# Patient Record
Sex: Female | Born: 1945 | Race: White | Hispanic: No | Smoking: Current every day smoker
Health system: Southern US, Community
[De-identification: ages and names within clinical notes are randomized; demographics above are authoritative.]

## PROBLEM LIST (undated history)

## (undated) DIAGNOSIS — F419 Anxiety disorder, unspecified: Secondary | ICD-10-CM

## (undated) DIAGNOSIS — I10 Essential (primary) hypertension: Secondary | ICD-10-CM

## (undated) DIAGNOSIS — E871 Hypo-osmolality and hyponatremia: Secondary | ICD-10-CM

## (undated) DIAGNOSIS — R296 Repeated falls: Secondary | ICD-10-CM

## (undated) DIAGNOSIS — F32A Depression, unspecified: Secondary | ICD-10-CM

## (undated) DIAGNOSIS — F329 Major depressive disorder, single episode, unspecified: Secondary | ICD-10-CM

## (undated) HISTORY — PX: ABDOMINAL HYSTERECTOMY: SHX81

---

## 2001-12-19 ENCOUNTER — Ambulatory Visit (HOSPITAL_COMMUNITY): Admission: RE | Admit: 2001-12-19 | Discharge: 2001-12-19 | Payer: Self-pay | Admitting: Internal Medicine

## 2001-12-19 ENCOUNTER — Encounter: Payer: Self-pay | Admitting: Internal Medicine

## 2002-03-04 ENCOUNTER — Ambulatory Visit (HOSPITAL_COMMUNITY): Admission: RE | Admit: 2002-03-04 | Discharge: 2002-03-04 | Payer: Self-pay | Admitting: Obstetrics and Gynecology

## 2002-03-04 ENCOUNTER — Encounter: Payer: Self-pay | Admitting: Obstetrics and Gynecology

## 2002-04-28 ENCOUNTER — Encounter: Payer: Self-pay | Admitting: Family Medicine

## 2002-04-28 ENCOUNTER — Ambulatory Visit (HOSPITAL_COMMUNITY): Admission: RE | Admit: 2002-04-28 | Discharge: 2002-04-28 | Payer: Self-pay | Admitting: Family Medicine

## 2002-08-06 ENCOUNTER — Encounter (HOSPITAL_COMMUNITY): Admission: RE | Admit: 2002-08-06 | Discharge: 2002-09-05 | Payer: Self-pay | Admitting: Internal Medicine

## 2002-09-07 ENCOUNTER — Encounter (HOSPITAL_COMMUNITY): Admission: RE | Admit: 2002-09-07 | Discharge: 2002-10-07 | Payer: Self-pay | Admitting: Internal Medicine

## 2003-04-06 ENCOUNTER — Observation Stay (HOSPITAL_COMMUNITY): Admission: AD | Admit: 2003-04-06 | Discharge: 2003-04-07 | Payer: Self-pay | Admitting: Internal Medicine

## 2003-05-14 ENCOUNTER — Ambulatory Visit (HOSPITAL_COMMUNITY): Admission: RE | Admit: 2003-05-14 | Discharge: 2003-05-14 | Payer: Self-pay | Admitting: Internal Medicine

## 2004-08-17 ENCOUNTER — Ambulatory Visit (HOSPITAL_COMMUNITY): Admission: RE | Admit: 2004-08-17 | Discharge: 2004-08-17 | Payer: Self-pay | Admitting: Internal Medicine

## 2005-03-19 ENCOUNTER — Ambulatory Visit: Payer: Self-pay | Admitting: Orthopedic Surgery

## 2005-03-29 ENCOUNTER — Ambulatory Visit: Payer: Self-pay | Admitting: Orthopedic Surgery

## 2005-04-04 ENCOUNTER — Encounter (HOSPITAL_COMMUNITY): Admission: RE | Admit: 2005-04-04 | Discharge: 2005-05-04 | Payer: Self-pay | Admitting: Orthopedic Surgery

## 2005-04-26 ENCOUNTER — Ambulatory Visit: Payer: Self-pay | Admitting: Orthopedic Surgery

## 2005-05-08 ENCOUNTER — Encounter: Admission: RE | Admit: 2005-05-08 | Discharge: 2005-05-08 | Payer: Self-pay | Admitting: Orthopedic Surgery

## 2005-05-22 ENCOUNTER — Encounter: Admission: RE | Admit: 2005-05-22 | Discharge: 2005-05-22 | Payer: Self-pay | Admitting: Orthopedic Surgery

## 2005-05-28 ENCOUNTER — Ambulatory Visit: Payer: Self-pay | Admitting: Orthopedic Surgery

## 2005-06-11 ENCOUNTER — Encounter: Admission: RE | Admit: 2005-06-11 | Discharge: 2005-06-11 | Payer: Self-pay | Admitting: Orthopedic Surgery

## 2005-07-13 ENCOUNTER — Encounter: Admission: RE | Admit: 2005-07-13 | Discharge: 2005-07-13 | Payer: Self-pay | Admitting: Orthopedic Surgery

## 2005-07-26 ENCOUNTER — Ambulatory Visit: Payer: Self-pay | Admitting: Orthopedic Surgery

## 2005-09-14 ENCOUNTER — Ambulatory Visit (HOSPITAL_COMMUNITY): Admission: RE | Admit: 2005-09-14 | Discharge: 2005-09-14 | Payer: Self-pay | Admitting: Internal Medicine

## 2005-12-07 ENCOUNTER — Ambulatory Visit (HOSPITAL_BASED_OUTPATIENT_CLINIC_OR_DEPARTMENT_OTHER): Admission: RE | Admit: 2005-12-07 | Discharge: 2005-12-07 | Payer: Self-pay | Admitting: Unknown Physician Specialty

## 2005-12-17 ENCOUNTER — Ambulatory Visit: Payer: Self-pay | Admitting: Internal Medicine

## 2006-01-04 ENCOUNTER — Ambulatory Visit (HOSPITAL_BASED_OUTPATIENT_CLINIC_OR_DEPARTMENT_OTHER): Admission: RE | Admit: 2006-01-04 | Discharge: 2006-01-04 | Payer: Self-pay | Admitting: Unknown Physician Specialty

## 2006-10-21 ENCOUNTER — Ambulatory Visit (HOSPITAL_COMMUNITY): Admission: RE | Admit: 2006-10-21 | Discharge: 2006-10-21 | Payer: Self-pay | Admitting: Internal Medicine

## 2007-01-22 ENCOUNTER — Ambulatory Visit: Payer: Self-pay | Admitting: Internal Medicine

## 2007-02-06 ENCOUNTER — Emergency Department (HOSPITAL_COMMUNITY): Admission: EM | Admit: 2007-02-06 | Discharge: 2007-02-06 | Payer: Self-pay | Admitting: Emergency Medicine

## 2007-02-21 ENCOUNTER — Ambulatory Visit (HOSPITAL_COMMUNITY): Admission: RE | Admit: 2007-02-21 | Discharge: 2007-02-21 | Payer: Self-pay | Admitting: Internal Medicine

## 2007-02-21 ENCOUNTER — Ambulatory Visit: Payer: Self-pay | Admitting: Internal Medicine

## 2007-11-03 ENCOUNTER — Ambulatory Visit (HOSPITAL_COMMUNITY): Admission: RE | Admit: 2007-11-03 | Discharge: 2007-11-03 | Payer: Self-pay | Admitting: Internal Medicine

## 2008-11-22 ENCOUNTER — Ambulatory Visit (HOSPITAL_COMMUNITY): Admission: RE | Admit: 2008-11-22 | Discharge: 2008-11-22 | Payer: Self-pay | Admitting: Internal Medicine

## 2009-11-15 ENCOUNTER — Emergency Department (HOSPITAL_COMMUNITY): Admission: EM | Admit: 2009-11-15 | Discharge: 2009-11-15 | Payer: Self-pay | Admitting: Emergency Medicine

## 2009-11-25 ENCOUNTER — Ambulatory Visit (HOSPITAL_COMMUNITY): Admission: RE | Admit: 2009-11-25 | Discharge: 2009-11-25 | Payer: Self-pay | Admitting: Internal Medicine

## 2011-01-14 ENCOUNTER — Encounter: Payer: Self-pay | Admitting: Internal Medicine

## 2011-01-15 ENCOUNTER — Ambulatory Visit (HOSPITAL_COMMUNITY)
Admission: RE | Admit: 2011-01-15 | Discharge: 2011-01-15 | Payer: Self-pay | Source: Home / Self Care | Attending: Internal Medicine | Admitting: Internal Medicine

## 2011-05-11 NOTE — Consult Note (Signed)
NAME:  Rachel Dodson, Rachel Dodson                          ACCOUNT NO.:  [id number]   MEDICAL RECORD NO.:  [id number]                   PATIENT TYPE:   LOCATION:                                       FACILITY:   PHYSICIAN:  Lionel December, M.D.                 DATE OF BIRTH:  11/19/1946   DATE OF CONSULTATION:  04/29/2003  DATE OF DISCHARGE:                                   CONSULTATION   REASON FOR CONSULTATION:  Iron-deficiency anemia.  Patient is here to  discuss further evaluation.   HISTORY OF PRESENT ILLNESS:  The patient is a 65 year old Caucasian female  who went to see Dr. Sherwood Gambler about three weeks ago because of weakness, shaky  legs, and hypotension.  She had also noted profound weakness.  She was found  to have a hemoglobin of 5.5 grams.  Iron studies subsequently confirmed it  to be iron deficiency.  She was given 2 units of PRBCs and felt a lot  better.  Her hemoglobin and hematocrit have come up; the last hematocrit  three weeks ago was 32%.  She has been on ferrous sulfate since then.  She  has also tested positive for Helicobacter pylori infection and finished  Prevpac about four days ago.  She denies melena or rectal bleeding.  Her  bowels move regularly.  She has a good appetite.  She did have hemoccult x2,  which were negative.  She has a five-year history of GERD, and she is  presently on Nexium, and she has virtually no heartburn or regurgitation.  She also denies dysphagia, hoarseness, or chronic cough.  She used to donate  blood fairly regularly but has not done so in the last one year.  She denies  hematuria or vaginal bleeding.  She has a good appetite and has not lost any  weight recently.   MEDICATIONS:  1. She is on Lotrel 5/20 mg daily.  2. Ferrous sulfate 325 mg t.i.d.  3. Nexium 40 mg q.a.m.  4. Xanax 0.5 mg t.i.d.  5. Remeron 15 mg daily.  6. Ibuprofen p.r.n.  7. __________ b.i.d.  8. B Complex daily.   PAST MEDICAL HISTORY:  1. Gastroesophageal reflux  disease for five years; she has been on Tums,     Zantac, Prevacid, Prilosec in the past.  She has never had EGD.  2. Hypertension was diagnosed two years ago.  3. She also has a history of depression and insomnia, symptoms well     controlled with therapy.  4. As above, she just finished H. pylori therapy (Prevpac).  5. She had a hysterectomy with BSO in 1987 for fibroids and endometriosis.   ALLERGIES:  None known.   FAMILY HISTORY:  Mother died at age 54; she had multiple health issues.  She  previously had been treated for breast carcinoma but was in remission.  She  also had dementia.  Father  died of pulmonary problem at age 62.  She does  not have any siblings.   SOCIAL HISTORY:  She is divorced, and she does not have any children.  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  She has  smoked off and on for a total of 10 years, one-half to one pack of  cigarettes per day, and she does not drink alcohol.   PHYSICAL EXAMINATION:  GENERAL:  A pleasant, well-developed, well-nourished  Caucasian female who is in no acute distress.  VITAL SIGNS:  She weighs 142 pounds.  She is 5 feet 4 inches tall.  Pulse 88  per minute, blood pressure 120/76, temp is 97.8.  HEENT:  Conjunctivae are pink.  Sclerae are nonicteric.  Oropharyngeal  mucosa is normal.  Dentition in satisfactory condition.  NECK:  Without masses or thyromegaly.  CARDIAC EXAM:  A regular rhythm.  Normal S1 and S2.  No murmur or gallop  noted.  LUNGS:  Clear to auscultation.  ABDOMEN:  Symmetrical.  Bowel sounds are normal.  Palpation reveals a soft  abdomen without tenderness, organomegaly, or masses.  RECTAL EXAM:  Deferred.  EXTREMITIES:  She does not have clubbing, paronychia, or peripheral edema.   LABORATORY DATA:  Her irons studies from last month reveal serum iron of 11,  TIBC of 473, saturation was 2%.  B12 and folate levels were normal.  Her  PT/PTT normal.  Her TSH was also normal  at 1.795.   ASSESSMENT:  The patient is a 65 year old Caucasian female who was recently  diagnosed with iron-deficiency anemia when she presented with profound  weakness and hypotension.  She has received 2 units of packed red blood  cells and feels a lot better, and she has been on iron therapy for the last  two to three weeks.  There is no history of gastrointestinal bleed, and no  blood was documented on hemoccults.  She certainly could be bleeding  intermittently.  She has been on chronic acid suppression and therefore  could have iron malabsorption; however, she needs to be checked to make sure  she does not have colorectal carcinoma.   RECOMMENDATIONS:  1. Esophagogastroduodenoscopy followed by total colonoscopy to be performed     within the next couple of weeks.  She needs to be off iron therapy for at     least 10 days to ensure that her colon is adequately prepped.  2. She will have CBC on the day that she has these procedures.  3. Further recommendations will depend on endoscopic findings.   I would like to thank Dr. Sherwood Gambler for the opportunity to participate in the  care of this nice lady.                                               Lionel December, M.D.    NR/MEDQ  D:  04/29/2003  T:  04/29/2003  Job:  [id number]

## 2011-05-11 NOTE — Procedures (Signed)
NAME:  Rachel Dodson, Rachel Dodson                ACCOUNT NO.:  [id number]   MEDICAL RECORD NO.:  [id number]          PATIENT TYPE:  OUT   LOCATION:  SLEEP CENTER                 FACILITY:  [facility]   PHYSICIAN:  Clinton D. Maple Hudson, M.D. DATE OF BIRTH:  Oct 15, 1946   DATE OF STUDY:  12/07/2005                              NOCTURNAL POLYSOMNOGRAM   REFERRING PHYSICIAN:  Dr. Jerolyn Shin.   DATE OF STUDY:  December 07, 2005.   INDICATION FOR STUDY:  Hypersomnia with sleep apnea.   EPWORTH SLEEPINESS SCORE:  20/24.   BMI:  27.   WEIGHT:  160 pounds.   MEDICATIONS:  Home medications include Lotrel, Ziac, Nexium, Cymbalta,  Xanax, iron, multivitamins.   SLEEP ARCHITECTURE:  Total sleep time 328 minutes with sleep efficiency 80%.  Stage I was 11%, stage II 73%, stages III and IV 17%, REM was absent. Sleep  onset at 10:58 p.m. Sleep latency 31 minutes. Awake after sleep onset 55  minutes, arousal index increased to 53.6 per hour. No bedtime medication  taken.   RESPIRATORY DATA:  Diagnostic NPSG protocol ordered. Apnea hypopnea index  (AHI, RDI) 101.5 obstructive events per hour indicating very severe  obstructive sleep apnea/hypopnea syndrome. This included 96 obstructive  apneas and 459 hypopneas. Events were not positional, recorded at  significant frequencies while supine and on left and right sides. REM AHI  N/A.   OXYGEN DATA:  Moderate snoring with mouth breathing noted. Oxygen  desaturation to a nadir of 79%. Mean oxygen saturation through the study was  92% on room air.   CARDIAC DATA:  Normal sinus rhythm.   MOVEMENT/PARASOMNIAS:  Occasional leg jerk, insignificant. Bathroom x1.   IMPRESSION/RECOMMENDATIONS:  1.  Very severe obstructive sleep apnea/hypopnea syndrome, AHI 101.5 per      hour with moderate snoring and oxygen desaturation to 79%. Events were      not positional.  2.  Consider return for C-PAP titration or evaluate for alternative      therapies as appropriate.  3.   Note technician comment about mouth breathing which may indicate      therapeutic potential of treatment for nasal congestion.      Clinton D. Maple Hudson, M.D.  Diplomate, Biomedical engineer of Sleep Medicine  Electronically Signed     CDY/MEDQ  D:  12/17/2005 12:09:31  T:  12/17/2005 14:09:17  Job:  [id number]

## 2011-05-11 NOTE — Procedures (Signed)
NAME:  Rachel Dodson, Rachel Dodson                ACCOUNT NO.:  [id number]   MEDICAL RECORD NO.:  [id number]          PATIENT TYPE:  OUT   LOCATION:  SLEEP CENTER                 FACILITY:  [facility]   PHYSICIAN:  Clinton D. Maple Hudson, M.D. DATE OF BIRTH:  [birth year]   DATE OF STUDY:  01/04/2006                              NOCTURNAL POLYSOMNOGRAM   REFERRING PHYSICIAN:  Dr. Jerolyn Shin.   DATE OF STUDY:  January 04, 2006.   INDICATION FOR STUDY:  Hypersomnia with obstructive sleep apnea.   EPWORTH SLEEPINESS SCORE:  13/24 (while on Provigil).   BMI:  27.   WEIGHT:  160 pounds.   A baseline diagnostic NPSG on December 07, 2005 had reported an AHI of 101  per hour. C-PAP titration is requested.   HOME MEDICATIONS:  Lotrel, Provigil, Nexium, Cymbalta, Xanax, Ziac, Advil.   SLEEP ARCHITECTURE:  Total sleep time 391 minutes with sleep efficiency 88%.  Stage I 3%, stage II 58%, stages III and IV 20%, REM 20% of total sleep  time. Sleep latency 6 minutes, REM latency 79 minutes, awake after sleep  onset 22 minutes, arousal index increased at 48. No bedtime medication  taken.   RESPIRATORY DATA:  C-PAP titration protocol:  C-PAP was titrated to 19 CWP  (AHI 0 per hour) with snoring prevented at that pressure. Subsequently, the  pressure was increased to 24 CWP while she was supine, with residual  hypopneas suggesting the possibility of position and possibly reflects  vasomotor nasal congestion at higher pressures. Finally while sleeping on  her right side, a pressure of 20 CWP prevented events and snoring. A small  ResMed ultra mirage nasal/oral mask was used with heated humidifier.   OXYGEN DATA:  Snoring was prevented around 19-20 CWP as noted with mean  oxygen saturation on C-PAP holding 94-97% on room air.   CARDIAC DATA:  Normal sinus rhythm.   MOVEMENTS/PARASOMNIA:  Occasional leg jerks with insignificant effect on  sleep, PLMA 1.4 per hour.   IMPRESSION/RECOMMENDATIONS:  1.  Optimum  C-PAP pressure appeared to be 19-20 CWP especially when off the      flat of her back. Higher pressures were uncomfortable as she woke up and      may have been associated with a vasomotor nasal congestion. A small      ResMed ultra mirage nasal/oral mask was used with a heated humidifier.  2.  Baseline NPSG on December 07, 2005 had reported an AHI of 101 per hour.      Scores in this range in a woman who is not morbidly obese raise question      of upper airway anatomic obstruction.      Clinton D. Maple Hudson, M.D.  Diplomate, Biomedical engineer of Sleep Medicine  Electronically Signed     CDY/MEDQ  D:  01/13/2006 09:13:14  T:  01/14/2006 00:50:27  Job:  [id number]

## 2011-05-11 NOTE — Op Note (Signed)
NAME:  [name], [name]                ACCOUNT NO.:  [id number]   MEDICAL RECORD NO.:  [id number]          PATIENT TYPE:  AMB   LOCATION:  DAY                           FACILITY:  [facility]   PHYSICIAN:  Lionel December, M.D.    DATE OF BIRTH:  02/16/1946   DATE OF PROCEDURE:  02/21/2007  DATE OF DISCHARGE:                                PROCEDURE NOTE   PROCEDURE:  Esophagogastroduodenoscopy.   ENDOSCOPIST:  Lionel December, M.D.   INDICATIONS:  Rachel Dodson is a 65 year old Caucasian female who was evaluated  back in May 2004 for a profound anemia secondary to iron deficiency.  She had EGD with a colonoscopy.  She had erosive esophagitis and a  question of short-segment Barrett's.  She has moderately large hiatal  hernia.  Her anemia has corrected with therapy.  She is also been  treated for H. pylori.  She is undergoing EGD to make sure we not  missing a Barrett's esophagus.  Procedures and risks were reviewed with  the patient and informed consent was obtained.   MEDICATIONS FOR CONSCIOUS SEDATION:  Benzocaine spray for oropharyngeal  topical anesthesia, Demerol 50 mg IV, Versed 15 mg IV.   FINDINGS:  Procedure was performed in endoscopy suite.  The patient's  vital signs and O2 SATs were monitored during the procedure and remained  stable.  The patient was placed in the left lateral decubitus position  and Pentax videoscope was passed via oropharynx without any difficulty  into esophagus.   ESOPHAGUS:  Mucosa of the esophagus was normal.  There were a few tiny  islands of ectopic gastric mucosa just proximal to GE junction, but no  Barrett's mucosa was noted.  GE junction was at 30 cm from the incisors.  Pictures were taken for the record.  She had moderate-to-large sliding  hiatal hernia.  Hiatus was at 37 cm.  Mucosa of the herniated part of  the stomach was normal.   STOMACH:  It was empty and distended very well with insufflation.  Folds  in the proximal stomach were normal.   Examination of the mucosa revealed  linear erythema and few erosions at antrum, but no ulceration was noted.  Pyloric channel was patent.  Angularis, fundus and cardia were examined  by retroflexing the scope and were normal.  Hernia was easily seen on  this view.   DUODENUM:  Bulbar mucosa was normal.  Scope was passed into the second  part of the duodenum, where mucosa and folds were normal.  Endoscope was  withdrawn.  The patient tolerated the procedure well.   FINAL DIAGNOSES:  Moderate to large sliding hiatal hernia.   Healed esophagitis, but no evidence of Barrett's.   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  Please note that she has been treated for Helicobacter pylori  gastritis.   RECOMMENDATIONS:  She will continue antireflux measures and PPI as  before.  She will continue MVI with iron.  She should have her H&H at  least twice a year.   She will return for OV every 6 months.  Lionel December, M.D.  Electronically Signed     NR/MEDQ  D:  02/21/2007  T:  02/21/2007  Job:  [id number]   cc:   Rachel Rear. Sherwood Gambler, MD  Fax: [phone]

## 2011-05-11 NOTE — H&P (Signed)
   NAME:  Rachel Dodson, Rachel Dodson                          ACCOUNT NO.:  [id number]   MEDICAL RECORD NO.:  [id number]                   PATIENT TYPE:   LOCATION:                                       FACILITY:   PHYSICIAN:  Madelin Rear. Sherwood Gambler, M.D.             DATE OF BIRTH:  Aug 14, 1946   DATE OF ADMISSION:  04/06/2003  DATE OF DISCHARGE:                                HISTORY & PHYSICAL   CHIEF COMPLAINT:  Fatigue.   HISTORY OF PRESENT ILLNESS:  The patient was seen in the office for severe  unrelenting fatigue as well as drops in blood pressure on therapy with beta  blocker, diuretic, ACE inhibitor, and amlodipine.  She had no chest pain, no  syncopal episodes.   PAST MEDICAL HISTORY:  1. Shoulder pain, under physical therapy for same.  2. Gastroesophageal reflux disease.  3. Hypertension.  4. Status post hysterectomy in 1987.   SOCIAL HISTORY:  Nonsmoker, nondrinker; no drug use.  Works at hospital.   FAMILY HISTORY:  Positive for coronary artery disease, breast carcinoma in  her mother, and cerebrovascular accidents.   REVIEW OF SYSTEMS:  Negative in detail except as mentioned above.   PHYSICAL EXAMINATION:  SKIN:  Sallow complexion with conjunctival pallor.  HEENT:  No JVD or adenopathy.  Neck was supple.  CHEST:  Clear.  CARDIAC:  Regular rate and rhythm without gallop or rub.  ABDOMEN:  Soft, no organomegaly or masses.  EXTREMITIES:  Without clubbing, cyanosis, or edema.  NEUROLOGIC:  Nonfocal.   LABORATORY DATA:  Laboratories were obtained in the outpatient setting and  revealed severe anemia, 5.5 grams percent.  She has microcytic indices.  Retic count, B12, iron studies are pending as is hemoccult stool.   IMPRESSION:  Severe anemia, probably chronic blood loss although pending the  aforementioned studies to confirm.  She will be admitted for workup and  transfusion starting with 2 units of packed RBCs and follow up hematocrit.  Observation status is anticipated and less  than 24-hour admission.                                              Madelin Rear. Sherwood Gambler, M.D.   LJF/MEDQ  D:  04/06/2003  T:  04/06/2003  Job:  [id number]

## 2011-05-11 NOTE — Op Note (Signed)
NAME:  Rachel Dodson, Rachel Dodson                          ACCOUNT NO.:  [id number]   MEDICAL RECORD NO.:  [id number]                   PATIENT TYPE:  AMB   LOCATION:  DAY                                  FACILITY:  [facility]   PHYSICIAN:  Lionel December, M.D.                 DATE OF BIRTH:  April 21, 1946   DATE OF PROCEDURE:  DATE OF DISCHARGE:                                 OPERATIVE REPORT   PROCEDURE:  Esophagogastroduodenoscopy followed by total colonoscopy.   ENDOSCOPIST:  Lionel December, M.D.   INDICATIONS:  Rachel Dodson is a 65 year old Caucasian female, patient of Dr. Sherwood Gambler,  who has recently presented with profound weakness and was found to have  anemia with a hemoglobin of 5.5 gm. This was subsequently confirmed due to  iron deficiency.  She has received 2 units of PRBCs and feels a lot better.  She has also been recently treated for H. pylori infection.  She has not had  any frank GI bleed and her stool was guaiac-negative.  She has chronic GERD  and she has been on PPI for 5 years and her symptoms are well controlled.  She is now undergoing diagnostic EGD and colonoscopy.  The procedure and  risks were reviewed with the patient and informed consent was obtained.   PREOPERATIVE MEDICATIONS:  Cetacaine spray for pharyngeal topical  anesthesia, Demerol 75 mg IV and Versed 14 mg IV in divided dose.   INSTRUMENT:  Olympus video system.   FINDINGS:  Procedure performed in endoscopy suite.  The patient's vital  signs and O2 saturations were monitored during the procedure and remained  stable.   PROCEDURE #1: ESOPHAGOGASTRODUODENOSCOPY:  The patient was placed in the  left lateral recumbent position and endoscope was passed via the oropharynx  without any difficulty into the esophagus.   ESOPHAGUS:  Mucosa of the esophagus was normal except there was a single  erosion distally emerging to the GE junction.  The squamocolumnar junction  was very wavy and there was a suspicion of a patch of Barrett  esophagus.  The squamocolumnar junction was located at 31-32 cm and the diaphragmatic  hiatus was at 38-39 cm.  She had at least a 7-cm size sliding hiatal hernia  which was estimated to be of moderate size.   STOMACH:  It was empty and distended very well with insufflation.  The folds  of the proximal stomach were normal.  Examination of the mucosa at body,  antrum, pyloric channel, as well as angularis and fundus were normal.   DUODENUM:  Examination of the bulb revealed normal mucosa.  The scope was  passed into the second part of the duodenum where the mucosa and folds were  normal.  Endoscope was withdrawn.  The patient was prepared for procedure  #2.   TOTAL COLONOSCOPY:  Rectal examination was performed.  No abnormality noted  on external or digital exam.   The  scope was placed in the rectum and advanced under vision into the  sigmoid colon.  Preparation was satisfactory.  A few small diverticula were  noted in the sigmoid colon.  The scope was passed to the cecum which was  identified by appendiceal orifice and the ileocecal valve.  TI was also  examined for at least 20 cm and was normal.  As the scope was withdrawn,  colonic mucosa was once again carefully examined; and was normal except that  there as a small polyp at rectum which was ablated by cold biopsy.  Rectal  mucosa was normal.   The scope was retroflexed to examine the anorectal junction.  It was  unremarkable.  The endoscope was straightened and withdrawn.  The patient  tolerated the procedure well.   FINAL DIAGNOSES:  1. Single erosion at distal esophagus.  2. Very wavy gastroesophageal junction with small patch of salmon-colored     mucosa suspicious for a Barrett's segment.  3. Moderate sized hiatal hernia.  4. Normal examination of the stomach, first and second part of the duodenum.  5. Normal terminal ileum.  6. A few small diverticula at sigmoid colon.  7. Small polyp ablated by a cold biopsy from the  proximal rectum.   DISCUSSION:  1. No lesion found to account for her iron-deficiency anemia.  2. She could have a malabsorption due to chronic acid suppression, or this     could have been to prior GI blood loss related to p.r.n. use of NSAID.     As the patient is asymptomatic, I do not feel that further evaluation is     necessary at this time.   RECOMMENDATIONS:  1. She will continue antireflux measures and Nexium as before.  2. She will resume her ferrous sulfate at 325 mg b.i.d. with meals.  3. CBC will be checked today. I will be contacting the patient with biopsy     results.  I would plan to see her back in 2 months and recheck her stools     and CBC.  If her H&H is normal then I would pursue with small-bowel     _______ study.  As for her short-segment of Barrett's esophagus is     concerned, I would recommend that she should return for an EGD 2 years     from now.                                               Lionel December, M.D.    NR/MEDQ  D:  05/14/2003  T:  05/14/2003  Job:  [id number]   cc:   Madelin Rear. Sherwood Gambler, M.D.  [address]  [place]  [place] [address]  Fax: [phone]

## 2011-05-11 NOTE — H&P (Signed)
NAME:  Rachel Dodson, Rachel Dodson                ACCOUNT NO.:  [id number]   MEDICAL RECORD NO.:  [id number]          PATIENT TYPE:  AMB   LOCATION:  DAY                           FACILITY:  [facility]   PHYSICIAN:  Lionel December, M.D.    DATE OF BIRTH:  [birth year]   DATE OF ADMISSION:  DATE OF DISCHARGE:  LH                              HISTORY & PHYSICAL   OFFICE NOTE.   PRESENTING COMPLAINT:  Follow up of her GERD, question of Barrett's  esophagus.   HISTORY OF PRESENT ILLNESS:  Rachel Dodson is a 65 year old Caucasian female  patient of Dr. Sharyon Medicus who is here for scheduled visit.  She initially  presented in May 2004 with profound anemia with a hemoglobin of 5.5  grams secondary to iron deficiency.  During this process.  She was also  diagnosed and treated for H. pylori gastritis.  She had EGD and a  colonoscopy in May 2004.  She had erosive reflux esophagitis with wavy  GE junction, and a small patch of salmon-colored mucosa felt to be  Barrett's, but was not biopsied in the setting of inflammation.  She had  a moderate sized sliding hiatal hernia, but normal exam in the stomach  and first and second part of the duodenum.  Her colonoscopy revealed a  few diverticula at the sigmoid colon, a hyperplastic polyp in rectum,  and a normal terminal ileum.  The patient's iron deficiency anemia  corrected with oral iron supplement; and she has remained so.  She did  require 2 units of PRBCs, early on; and her hemoglobin was 5.5 grams.   She states she is feeling fine.  Her hemoglobin 1 week ago was 13 grams  by Dr. Sherwood Gambler.  She says that her heartburn is well-controlled with  therapy.  She has occasional nocturnal regurgitation.  She had some  dental work done recently; and was told by her dentist that her damage  has been secondary to reflux.  She has tried to take 1 Nexium instead of  2; and she has intractable symptoms at night.  If she skips the morning  dose, she has symptoms around noontime.  She denies  dysphagia or throat  symptoms.  She also denies abdominal pain, melena, or rectal bleeding.  She states her weight had gone over 180 pounds; and by watching her  diet, she has managed to lose 25 pounds.  Her appetite is up-and-down  though.   MEDICATIONS:  1. She is on Nexium 40 mg b.i.d.  2. Xanax 1 mg t.i.d. p.r.n. which she does not take daily.  3. MVI with iron one daily.  4. Ziac 5/6.25 b.i.d.  5. Provigil 200 mg daily.  6. Norvasc 10 mg daily.  7. Celebrex 200 mg b.i.d.  8. Cymbalta 60 mg daily.   PAST MEDICAL HISTORY:  1. History of iron-deficiency anemia diagnosed in May 2004, as above.  2. Chronic GERD.  3. She has hypertension, history of depression, and insomnia well-      controlled with therapy.  4. H. pylori treated with Prevpac in April or May 2004.  5. She  had a hysterectomy with removal of one ovary in 1987.  6. She recently had dental work done under general anesthesia.   ALLERGIES:  NK.   FAMILY HISTORY:  Negative for colorectal carcinoma.  Father had  emphysema he developed at a young age.  He also had cirrhosis, possibly  alcohol, but no history of alpha 1 antitrypsin deficiency.   SOCIAL HISTORY:  Rachel Dodson works at [employer].  She is divorced and does  not have any children.  She does not drink alcohol, and she smokes less  than 1/2 pack-a-day which she has done for 16 years.   OBJECTIVE:  VITAL SIGNS:  Weight 157 pounds.  She is 5 feet 4-1/2 inches  tall.  Pulse 76 per minute, blood pressure 132/86, temperature is 97.6.  HEENT:  Conjunctivae is pink.  Sclerae is nonicteric.  Oropharyngeal  mucosa is normal.  NECK:  No neck masses are noted.  CARDIAC EXAM:  With regular rhythm.  Normal S1-S2.  No murmur or gallop  noted.  LUNGS:  Clear to auscultation.  ABDOMEN:  Full, but soft and nontender without organomegaly or masses.  RECTAL EXAMINATION:  Deferred.  No clubbing or edema noted.   ASSESSMENT:  1. Chronic gastroesophageal reflux disease.  She has  moderate-size      sliding hiatal hernia, and possibly a short segment of Barrett's      esophagus.  This needs to be confirmed histologically; and if this      is the case, she will need to undergo periodic surveillance exam.      Her symptoms are not well controlled with a single dose of PPI; and      she will remain on b.i.d. for now.  2. History of iron-deficiency anemia.  Recent hemoglobin was normal.      She is still on iron.  She is on chronic NSAID therapy, and at risk      for mucosal injury to her GI tract.   PLAN:  1. Hemoccult x1.  2. Esophagogastroduodenoscopy to be performed at [facility] in the future.  3. She will continue antireflux measures and Nexium at 40 mg b.i.d.      Lionel December, M.D.  Electronically Signed     NR/MEDQ  D:  01/22/2007  T:  01/22/2007  Job:  [id number]   cc:   Madelin Rear. Sherwood Gambler, MD  Fax: [phone]   [facility]  Fax: [phone]

## 2011-10-15 ENCOUNTER — Other Ambulatory Visit (HOSPITAL_COMMUNITY): Payer: Self-pay | Admitting: Internal Medicine

## 2011-10-15 DIAGNOSIS — Z139 Encounter for screening, unspecified: Secondary | ICD-10-CM

## 2011-10-22 ENCOUNTER — Other Ambulatory Visit (HOSPITAL_COMMUNITY): Payer: Self-pay

## 2011-11-07 ENCOUNTER — Other Ambulatory Visit (HOSPITAL_COMMUNITY): Payer: Self-pay

## 2011-11-13 ENCOUNTER — Other Ambulatory Visit (HOSPITAL_COMMUNITY): Payer: Self-pay

## 2011-12-03 ENCOUNTER — Ambulatory Visit (HOSPITAL_COMMUNITY)
Admission: RE | Admit: 2011-12-03 | Discharge: 2011-12-03 | Disposition: A | Discharge status: Home / Self Care | Payer: Medicare Other | Source: Ambulatory Visit | Attending: Internal Medicine | Admitting: Internal Medicine

## 2011-12-03 DIAGNOSIS — Z780 Asymptomatic menopausal state: Secondary | ICD-10-CM | POA: Insufficient documentation

## 2011-12-03 DIAGNOSIS — Z139 Encounter for screening, unspecified: Secondary | ICD-10-CM

## 2011-12-03 DIAGNOSIS — M899 Disorder of bone, unspecified: Secondary | ICD-10-CM | POA: Insufficient documentation

## 2012-01-16 ENCOUNTER — Other Ambulatory Visit (HOSPITAL_COMMUNITY): Payer: Self-pay | Admitting: Internal Medicine

## 2012-01-16 ENCOUNTER — Other Ambulatory Visit (HOSPITAL_BASED_OUTPATIENT_CLINIC_OR_DEPARTMENT_OTHER): Payer: Self-pay | Admitting: Internal Medicine

## 2012-01-16 DIAGNOSIS — Z139 Encounter for screening, unspecified: Secondary | ICD-10-CM

## 2012-01-28 ENCOUNTER — Ambulatory Visit (HOSPITAL_COMMUNITY): Payer: Medicare Other

## 2012-02-04 ENCOUNTER — Ambulatory Visit (HOSPITAL_COMMUNITY): Payer: Medicare Other

## 2012-02-25 ENCOUNTER — Ambulatory Visit (HOSPITAL_COMMUNITY): Payer: Medicare Other

## 2012-06-23 ENCOUNTER — Ambulatory Visit (HOSPITAL_COMMUNITY)
Admission: RE | Admit: 2012-06-23 | Discharge: 2012-06-23 | Disposition: A | Discharge status: Home / Self Care | Payer: Medicare Other | Source: Ambulatory Visit | Attending: Internal Medicine | Admitting: Internal Medicine

## 2012-06-23 DIAGNOSIS — Z1231 Encounter for screening mammogram for malignant neoplasm of breast: Secondary | ICD-10-CM | POA: Insufficient documentation

## 2012-06-23 DIAGNOSIS — Z978 Presence of other specified devices: Secondary | ICD-10-CM | POA: Insufficient documentation

## 2012-06-23 DIAGNOSIS — Z139 Encounter for screening, unspecified: Secondary | ICD-10-CM

## 2013-07-07 ENCOUNTER — Other Ambulatory Visit (HOSPITAL_COMMUNITY): Payer: Self-pay | Admitting: Internal Medicine

## 2013-07-07 DIAGNOSIS — Z139 Encounter for screening, unspecified: Secondary | ICD-10-CM

## 2013-08-03 ENCOUNTER — Ambulatory Visit (HOSPITAL_COMMUNITY): Payer: Medicare Other

## 2013-09-14 ENCOUNTER — Ambulatory Visit (HOSPITAL_COMMUNITY): Payer: Medicare Other

## 2013-09-15 ENCOUNTER — Ambulatory Visit (HOSPITAL_COMMUNITY): Payer: Medicare Other

## 2013-09-22 ENCOUNTER — Ambulatory Visit (HOSPITAL_COMMUNITY): Payer: Medicare Other

## 2013-10-20 ENCOUNTER — Inpatient Hospital Stay (HOSPITAL_COMMUNITY)
Admission: EM | Admit: 2013-10-20 | Discharge: 2013-10-30 | DRG: 853 | Disposition: A | Discharge status: Skilled Nursing Facility | Payer: Medicare Other | Attending: Pulmonary Disease | Admitting: Pulmonary Disease

## 2013-10-20 ENCOUNTER — Encounter (HOSPITAL_COMMUNITY): Payer: Self-pay | Admitting: Emergency Medicine

## 2013-10-20 ENCOUNTER — Emergency Department (HOSPITAL_COMMUNITY): Payer: Medicare Other

## 2013-10-20 DIAGNOSIS — I498 Other specified cardiac arrhythmias: Secondary | ICD-10-CM | POA: Diagnosis not present

## 2013-10-20 DIAGNOSIS — F411 Generalized anxiety disorder: Secondary | ICD-10-CM | POA: Diagnosis present

## 2013-10-20 DIAGNOSIS — J90 Pleural effusion, not elsewhere classified: Secondary | ICD-10-CM

## 2013-10-20 DIAGNOSIS — N390 Urinary tract infection, site not specified: Secondary | ICD-10-CM | POA: Diagnosis present

## 2013-10-20 DIAGNOSIS — J9600 Acute respiratory failure, unspecified whether with hypoxia or hypercapnia: Secondary | ICD-10-CM

## 2013-10-20 DIAGNOSIS — K449 Diaphragmatic hernia without obstruction or gangrene: Secondary | ICD-10-CM | POA: Diagnosis present

## 2013-10-20 DIAGNOSIS — R911 Solitary pulmonary nodule: Secondary | ICD-10-CM | POA: Diagnosis present

## 2013-10-20 DIAGNOSIS — R918 Other nonspecific abnormal finding of lung field: Secondary | ICD-10-CM

## 2013-10-20 DIAGNOSIS — R222 Localized swelling, mass and lump, trunk: Secondary | ICD-10-CM

## 2013-10-20 DIAGNOSIS — Z87891 Personal history of nicotine dependence: Secondary | ICD-10-CM

## 2013-10-20 DIAGNOSIS — I951 Orthostatic hypotension: Secondary | ICD-10-CM

## 2013-10-20 DIAGNOSIS — F329 Major depressive disorder, single episode, unspecified: Secondary | ICD-10-CM | POA: Diagnosis present

## 2013-10-20 DIAGNOSIS — Z79899 Other long term (current) drug therapy: Secondary | ICD-10-CM

## 2013-10-20 DIAGNOSIS — I959 Hypotension, unspecified: Secondary | ICD-10-CM

## 2013-10-20 DIAGNOSIS — I700 Atherosclerosis of aorta: Secondary | ICD-10-CM | POA: Diagnosis present

## 2013-10-20 DIAGNOSIS — N183 Chronic kidney disease, stage 3 unspecified: Secondary | ICD-10-CM | POA: Diagnosis present

## 2013-10-20 DIAGNOSIS — E236 Other disorders of pituitary gland: Secondary | ICD-10-CM | POA: Diagnosis not present

## 2013-10-20 DIAGNOSIS — J869 Pyothorax without fistula: Secondary | ICD-10-CM | POA: Diagnosis not present

## 2013-10-20 DIAGNOSIS — I509 Heart failure, unspecified: Secondary | ICD-10-CM | POA: Diagnosis not present

## 2013-10-20 DIAGNOSIS — D72829 Elevated white blood cell count, unspecified: Secondary | ICD-10-CM

## 2013-10-20 DIAGNOSIS — J811 Chronic pulmonary edema: Secondary | ICD-10-CM

## 2013-10-20 DIAGNOSIS — E876 Hypokalemia: Secondary | ICD-10-CM | POA: Diagnosis not present

## 2013-10-20 DIAGNOSIS — Z9981 Dependence on supplemental oxygen: Secondary | ICD-10-CM

## 2013-10-20 DIAGNOSIS — J95821 Acute postprocedural respiratory failure: Secondary | ICD-10-CM | POA: Diagnosis not present

## 2013-10-20 DIAGNOSIS — Z602 Problems related to living alone: Secondary | ICD-10-CM

## 2013-10-20 DIAGNOSIS — J9601 Acute respiratory failure with hypoxia: Secondary | ICD-10-CM

## 2013-10-20 DIAGNOSIS — F3289 Other specified depressive episodes: Secondary | ICD-10-CM | POA: Diagnosis present

## 2013-10-20 DIAGNOSIS — A419 Sepsis, unspecified organism: Principal | ICD-10-CM | POA: Diagnosis present

## 2013-10-20 DIAGNOSIS — N179 Acute kidney failure, unspecified: Secondary | ICD-10-CM | POA: Diagnosis not present

## 2013-10-20 DIAGNOSIS — G8918 Other acute postprocedural pain: Secondary | ICD-10-CM | POA: Diagnosis not present

## 2013-10-20 DIAGNOSIS — R5381 Other malaise: Secondary | ICD-10-CM | POA: Diagnosis present

## 2013-10-20 DIAGNOSIS — R110 Nausea: Secondary | ICD-10-CM | POA: Diagnosis not present

## 2013-10-20 DIAGNOSIS — D649 Anemia, unspecified: Secondary | ICD-10-CM | POA: Diagnosis present

## 2013-10-20 DIAGNOSIS — I129 Hypertensive chronic kidney disease with stage 1 through stage 4 chronic kidney disease, or unspecified chronic kidney disease: Secondary | ICD-10-CM | POA: Diagnosis present

## 2013-10-20 HISTORY — DX: Anxiety disorder, unspecified: F41.9

## 2013-10-20 HISTORY — DX: Major depressive disorder, single episode, unspecified: F32.9

## 2013-10-20 HISTORY — DX: Depression, unspecified: F32.A

## 2013-10-20 HISTORY — DX: Essential (primary) hypertension: I10

## 2013-10-20 LAB — CBC WITH DIFFERENTIAL/PLATELET
Eosinophils Relative: 0 % (ref 0–5)
Hemoglobin: 14.6 g/dL (ref 12.0–15.0)
Lymphs Abs: 1.2 10*3/uL (ref 0.7–4.0)
Monocytes Absolute: 1.6 10*3/uL — ABNORMAL HIGH (ref 0.1–1.0)
Neutrophils Relative %: 88 % — ABNORMAL HIGH (ref 43–77)
WBC: 23.5 10*3/uL — ABNORMAL HIGH (ref 4.0–10.5)

## 2013-10-20 LAB — URINALYSIS, ROUTINE W REFLEX MICROSCOPIC
Bilirubin Urine: NEGATIVE
Glucose, UA: NEGATIVE mg/dL
Ketones, ur: NEGATIVE mg/dL
Nitrite: POSITIVE — AB
Specific Gravity, Urine: 1.02 (ref 1.005–1.030)
pH: 6 (ref 5.0–8.0)

## 2013-10-20 LAB — BASIC METABOLIC PANEL
CO2: 27 mEq/L (ref 19–32)
Calcium: 9.7 mg/dL (ref 8.4–10.5)
Creatinine, Ser: 1.01 mg/dL (ref 0.50–1.10)
Glucose, Bld: 141 mg/dL — ABNORMAL HIGH (ref 70–99)
Potassium: 3.4 mEq/L — ABNORMAL LOW (ref 3.5–5.1)
Sodium: 131 mEq/L — ABNORMAL LOW (ref 135–145)

## 2013-10-20 LAB — URINE MICROSCOPIC-ADD ON

## 2013-10-20 MED ORDER — ACETAMINOPHEN 500 MG PO TABS
1000.0000 mg | ORAL_TABLET | Freq: Once | ORAL | Status: AC
  Administered 2013-10-20: 1000 mg via ORAL
  Filled 2013-10-20: qty 2

## 2013-10-20 MED ORDER — LEVOFLOXACIN IN D5W 500 MG/100ML IV SOLN
INTRAVENOUS | Status: AC
  Filled 2013-10-20: qty 100

## 2013-10-20 MED ORDER — LEVOFLOXACIN IN D5W 500 MG/100ML IV SOLN
500.0000 mg | Freq: Once | INTRAVENOUS | Status: AC
  Administered 2013-10-20: 500 mg via INTRAVENOUS
  Filled 2013-10-20: qty 100

## 2013-10-20 MED ORDER — ALPRAZOLAM 0.5 MG PO TABS
0.5000 mg | ORAL_TABLET | Freq: Every evening | ORAL | Status: DC | PRN
  Administered 2013-10-21 – 2013-10-22 (×2): 0.5 mg via ORAL
  Filled 2013-10-20 (×2): qty 1

## 2013-10-20 MED ORDER — MORPHINE SULFATE 2 MG/ML IJ SOLN
2.0000 mg | Freq: Once | INTRAMUSCULAR | Status: AC
  Administered 2013-10-20: 2 mg via INTRAVENOUS
  Filled 2013-10-20: qty 1

## 2013-10-20 MED ORDER — IOHEXOL 300 MG/ML  SOLN
80.0000 mL | Freq: Once | INTRAMUSCULAR | Status: AC | PRN
  Administered 2013-10-20: 80 mL via INTRAVENOUS

## 2013-10-20 MED ORDER — HYDROMORPHONE HCL PF 1 MG/ML IJ SOLN
1.0000 mg | INTRAMUSCULAR | Status: DC | PRN
  Administered 2013-10-21 – 2013-10-23 (×6): 1 mg via INTRAVENOUS
  Filled 2013-10-20 (×6): qty 1

## 2013-10-20 MED ORDER — SODIUM CHLORIDE 0.9 % IJ SOLN
3.0000 mL | Freq: Two times a day (BID) | INTRAMUSCULAR | Status: DC
  Administered 2013-10-20 – 2013-10-25 (×7): 3 mL via INTRAVENOUS
  Administered 2013-10-25: 10:00:00 via INTRAVENOUS
  Administered 2013-10-27 (×2): 3 mL via INTRAVENOUS

## 2013-10-20 MED ORDER — FUROSEMIDE 10 MG/ML IJ SOLN
20.0000 mg | Freq: Once | INTRAMUSCULAR | Status: AC
  Administered 2013-10-20: 20 mg via INTRAVENOUS
  Filled 2013-10-20: qty 2

## 2013-10-20 MED ORDER — PANTOPRAZOLE SODIUM 40 MG PO TBEC
40.0000 mg | DELAYED_RELEASE_TABLET | Freq: Two times a day (BID) | ORAL | Status: DC
  Administered 2013-10-20 – 2013-10-22 (×5): 40 mg via ORAL
  Filled 2013-10-20 (×5): qty 1

## 2013-10-20 MED ORDER — SODIUM CHLORIDE 0.9 % IJ SOLN: 3.0000 mL | INTRAMUSCULAR | Status: DC | PRN

## 2013-10-20 MED ORDER — LEVOFLOXACIN IN D5W 500 MG/100ML IV SOLN
500.0000 mg | INTRAVENOUS | Status: DC
  Administered 2013-10-21: 500 mg via INTRAVENOUS
  Filled 2013-10-20 (×2): qty 100

## 2013-10-20 MED ORDER — SODIUM CHLORIDE 0.9 % IV SOLN: INTRAVENOUS | Status: DC

## 2013-10-20 MED ORDER — SODIUM CHLORIDE 0.9 % IV SOLN: 250.0000 mL | INTRAVENOUS | Status: DC | PRN

## 2013-10-20 MED ORDER — DULOXETINE HCL 60 MG PO CPEP
60.0000 mg | ORAL_CAPSULE | Freq: Every day | ORAL | Status: DC
  Administered 2013-10-21 – 2013-10-30 (×9): 60 mg via ORAL
  Filled 2013-10-20 (×10): qty 1

## 2013-10-20 MED ORDER — CLONAZEPAM 0.5 MG PO TABS
0.5000 mg | ORAL_TABLET | Freq: Two times a day (BID) | ORAL | Status: DC
  Administered 2013-10-20 – 2013-10-30 (×18): 0.5 mg via ORAL
  Filled 2013-10-20 (×19): qty 1

## 2013-10-20 NOTE — ED Notes (Signed)
Patient arrives via EMS from home with c/o diffuse abdominal pain that started today. Patient also has multiple other complaints: shortness of breath, back pain from fall on Saturday. Patient also states LOC 2 weeks ago, never sought medical care for complaint.

## 2013-10-20 NOTE — Progress Notes (Signed)
ANTIBIOTIC CONSULT NOTE - INITIAL  Pharmacy Consult for Levaquin IV Indication:  Pulmonary coverage in Lung CA patient  Allergies  Allergen Reactions  . Latex     Patient Measurements: Height: 5\' 4"  (162.6 cm) Weight: 139 lb 12.4 oz (63.4 kg) IBW/kg (Calculated) : 54.7 Adjusted Body Weight: 57 kg  Vital Signs: Temp: 97.3 F (36.3 C) (10/28 2147) Temp src: Oral (10/28 1448) BP: 105/53 mmHg (10/28 2147) Pulse Rate: 83 (10/28 2147) Intake/Output from previous day:   Intake/Output from this shift: Total I/O In: -  Out: 175 [Urine:175]  Labs:  Recent Labs  10/20/13 1319  WBC 23.5*  HGB 14.6  PLT 302  CREATININE 1.01   Estimated Creatinine Clearance: 46 ml/min (by C-G formula based on Cr of 1.01). No results found for this basename: VANCOTROUGH, VANCOPEAK, VANCORANDOM, GENTTROUGH, GENTPEAK, GENTRANDOM, TOBRATROUGH, TOBRAPEAK, TOBRARND, AMIKACINPEAK, AMIKACINTROU, AMIKACIN,  in the last 72 hours   Microbiology: No results found for this or any previous visit (from the past 720 hour(s)).  Medical History: Past Medical History  Diagnosis Date  . Hypertension   . Depression   . Anxiety     Medications:  Scheduled:  . clonazePAM  0.5 mg Oral BID  . [START ON 10/21/2013] DULoxetine  60 mg Oral Daily  . furosemide  20 mg Intravenous Once  . levofloxacin (LEVAQUIN) IV  500 mg Intravenous Once  . [START ON 10/21/2013] levofloxacin (LEVAQUIN) IV  500 mg Intravenous Q24H  . pantoprazole  40 mg Oral BID  . sodium chloride  3 mL Intravenous Q12H   Infusions:   PRN: sodium chloride, ALPRAZolam, HYDROmorphone (DILAUDID) injection, sodium chloride  Assessment: 27yr female with CrCl 45-41ml/min.  Clinical dx of possible pulmonary infection.  Levaquin IV dose for CAP is 500mg  IV q24h (for [facility] is 750mg  IV q24h) x 7-10 days.    Goal of Therapy and Plan: Plan to give 500mg  x 1 dose tonight and then start 500mg  IV q24h in 12 hours.  Will monitor for infection sx's, renal  function, and possible need for more aggressive dosage.     Shirley Muscat E 10/20/2013,11:01 PM

## 2013-10-20 NOTE — H&P (Signed)
PCP:   Cassell Smiles., MD   Chief Complaint:  Weakness and shortness of breath  HPI: 67 year old female who came to the ED with a chief complaint of weakness and ongoing shortness of breath, back pain dizziness. Patient says that she fell in the shower 3 days ago. She also admits to having some chest pain associated with shortness of breath. Patient lives by herself, patient is a former smoker. In the ED chest x-ray was done which showed large effusion on the left side and it was followed by CT chest which showed moderate loculated left pleural effusion and appearance of 40 mm superior segment right lower lobe pulmonary nodule rare the possibility of malignant effusion. The possibility of nodule representing primary tumor, bronchogenic carcinoma.  Allergies:   Allergies  Allergen Reactions  . Latex       Past Medical History  Diagnosis Date  . Hypertension   . Depression   . Anxiety     Past Surgical History  Procedure Laterality Date  . Abdominal hysterectomy      Prior to Admission medications   Medication Sig Start Date End Date Taking? Authorizing Provider  ALPRAZolam Prudy Feeler) 0.5 MG tablet Take 0.5 mg by mouth at bedtime as needed for sleep.   Yes Historical Provider, MD  amLODipine (NORVASC) 5 MG tablet Take 5 mg by mouth every other day.    Yes Historical Provider, MD  bisoprolol-hydrochlorothiazide (ZIAC) 5-6.25 MG per tablet Take 1 tablet by mouth 2 (two) times daily. 09/29/13  Yes Historical Provider, MD  clonazePAM (KLONOPIN) 0.5 MG tablet Take 0.5 mg by mouth 2 (two) times daily. 09/29/13  Yes Historical Provider, MD  DULoxetine (CYMBALTA) 60 MG capsule Take 60 mg by mouth daily. 09/29/13  Yes Historical Provider, MD  losartan (COZAAR) 50 MG tablet Take 50 mg by mouth daily. 09/29/13  Yes Historical Provider, MD  oxyCODONE (ROXICODONE) 15 MG immediate release tablet Take 15 mg by mouth 3 (three) times daily. 09/02/13  Yes Historical Provider, MD  pantoprazole (PROTONIX)  40 MG tablet Take 40 mg by mouth 2 (two) times daily. 09/02/13  Yes Historical Provider, MD    Social History:  reports that she has quit smoking. Her smoking use included Cigarettes. She smoked 0.00 packs per day. She does not have any smokeless tobacco history on file. She reports that she does not drink alcohol or use illicit drugs.   All the positives are listed in BOLD  Review of Systems:  HEENT: Headache, blurred vision, runny nose, sore throat Neck: Hypothyroidism, hyperthyroidism,,lymphadenopathy Chest : Shortness of breath, history of COPD, Asthma Heart : Chest pain, history of coronary arterey disease GI:  Nausea, vomiting, diarrhea, constipation, GERD GU: Dysuria, urgency, frequency of urination, hematuria Neuro: Stroke, seizures, syncope Psych: Depression, anxiety, hallucinations   Physical Exam: Blood pressure 105/53, pulse 83, temperature 97.3 F (36.3 C), temperature source Oral, resp. rate 20, height 5\' 4"  (1.626 m), weight 139 lb 12.4 oz (63.4 kg), SpO2 97.00%. Constitutional:   Patient is emaciated looking female in mild respiratory distress and cooperative with exam. Head: Normocephalic and atraumatic Mouth: Mucus membranes moist Eyes: PERRL, EOMI, conjunctivae normal Neck: Supple, No Thyromegaly Cardiovascular: RRR, S1 normal, S2 normal Pulmonary/Chest: Bibasilar crackles, reduced breath sounds on left side Abdominal: Soft. Non-tender, non-distended, bowel sounds are normal, no masses, organomegaly, or guarding present.  Neurological: A&O x3, Strenght is normal and symmetric bilaterally, cranial nerve II-XII are grossly intact, no focal motor deficit, sensory intact to light touch bilaterally.  Extremities :  No Cyanosis, Clubbing or Edema   Labs on Admission:  Results for orders placed during the hospital encounter of 10/20/13 (from the past 48 hour(s))  CBC WITH DIFFERENTIAL     Status: Abnormal   Collection Time    10/20/13  1:19 PM      Result Value Range    WBC 23.5 (*) 4.0 - 10.5 K/uL   RBC 4.54  3.87 - 5.11 MIL/uL   Hemoglobin 14.6  12.0 - 15.0 g/dL   HCT 78.2  95.6 - 21.3 %   MCV 93.0  78.0 - 100.0 fL   MCH 32.2  26.0 - 34.0 pg   MCHC 34.6  30.0 - 36.0 g/dL   RDW 08.6  57.8 - 46.9 %   Platelets 302  150 - 400 K/uL   Neutrophils Relative % 88 (*) 43 - 77 %   Neutro Abs 20.7 (*) 1.7 - 7.7 K/uL   Lymphocytes Relative 5 (*) 12 - 46 %   Lymphs Abs 1.2  0.7 - 4.0 K/uL   Monocytes Relative 7  3 - 12 %   Monocytes Absolute 1.6 (*) 0.1 - 1.0 K/uL   Eosinophils Relative 0  0 - 5 %   Eosinophils Absolute 0.0  0.0 - 0.7 K/uL   Basophils Relative 0  0 - 1 %   Basophils Absolute 0.0  0.0 - 0.1 K/uL  BASIC METABOLIC PANEL     Status: Abnormal   Collection Time    10/20/13  1:19 PM      Result Value Range   Sodium 131 (*) 135 - 145 mEq/L   Potassium 3.4 (*) 3.5 - 5.1 mEq/L   Chloride 92 (*) 96 - 112 mEq/L   CO2 27  19 - 32 mEq/L   Glucose, Bld 141 (*) 70 - 99 mg/dL   BUN 21  6 - 23 mg/dL   Creatinine, Ser 6.29  0.50 - 1.10 mg/dL   Calcium 9.7  8.4 - 52.8 mg/dL   GFR calc non Af Amer 56 (*) >90 mL/min   GFR calc Af Amer 65 (*) >90 mL/min   Comment: (NOTE)     The eGFR has been calculated using the CKD EPI equation.     This calculation has not been validated in all clinical situations.     eGFR's persistently <90 mL/min signify possible Chronic Kidney     Disease.  TROPONIN I     Status: None   Collection Time    10/20/13  1:19 PM      Result Value Range   Troponin I <0.30  <0.30 ng/mL   Comment:            Due to the release kinetics of cTnI,     a negative result within the first hours     of the onset of symptoms does not rule out     myocardial infarction with certainty.     If myocardial infarction is still suspected,     repeat the test at appropriate intervals.  URINALYSIS, ROUTINE W REFLEX MICROSCOPIC     Status: Abnormal   Collection Time    10/20/13  3:24 PM      Result Value Range   Color, Urine AMBER (*) YELLOW    Comment: BIOCHEMICALS MAY BE AFFECTED BY COLOR   APPearance HAZY (*) CLEAR   Specific Gravity, Urine 1.020  1.005 - 1.030   pH 6.0  5.0 - 8.0   Glucose, UA NEGATIVE  NEGATIVE mg/dL  Hgb urine dipstick TRACE (*) NEGATIVE   Bilirubin Urine NEGATIVE  NEGATIVE   Ketones, ur NEGATIVE  NEGATIVE mg/dL   Protein, ur NEGATIVE  NEGATIVE mg/dL   Urobilinogen, UA 0.2  0.0 - 1.0 mg/dL   Nitrite POSITIVE (*) NEGATIVE   Leukocytes, UA SMALL (*) NEGATIVE  URINE MICROSCOPIC-ADD ON     Status: Abnormal   Collection Time    10/20/13  3:24 PM      Result Value Range   WBC, UA 11-20  <3 WBC/hpf   RBC / HPF 0-2  <3 RBC/hpf   Bacteria, UA MANY (*) RARE    Radiological Exams on Admission: Dg Chest 2 View  10/20/2013   CLINICAL DATA:  Short of breath, chest pain  EXAM: CHEST  2 VIEW  COMPARISON:  None.  FINDINGS: Cardiac silhouette is enlarged. There is a large right pleural effusion. There is associated left lower lobe atelectasis. Right lung is relatively clear. Large hiatal hernia is also noted.  IMPRESSION: 1. Large left effusion with associated atelectasis. Findings may warrant a contrast CT thorax for further evaluation as no comparison available. 2. Large hiatal hernia.   Electronically Signed   By: Genevive Bi M.D.   On: 10/20/2013 15:17   Ct Chest W Contrast  10/20/2013   CLINICAL DATA:  Fall 10/17/2013. Anterior chest pain. Left pleural effusion on chest radiograph.  EXAM: CT CHEST WITH CONTRAST  TECHNIQUE: Multidetector CT imaging of the chest was performed during intravenous contrast administration.  CONTRAST:  80mL OMNIPAQUE IOHEXOL 300 MG/ML  SOLN  COMPARISON:  10/20/2013 radiograph.  FINDINGS: There is a moderate size loculated left pleural effusion present. This demonstrates low attenuation. The fluid appears simple. There is no pleural fluid identified contralateral size. There is a pulmonary nodule in the superior segment of the right lower lobe measuring 14 mm. This raises the possibility  of malignant effusion on the left side with metastatic disease to the right lower lobe.  There is collapse/ consolidation of the lingula and left lower lobe. Small mediastinal lymph nodes are present, not pathologically enlarged. There is no pericardial effusion.  There is a large hiatal hernia obtaining almost all of the stomach which is in the right side of the chest. Dense mitral annular calcification is present. Bilateral calcified breast implants are present. There is no axillary adenopathy. The aorta and branch vessels show atherosclerosis. No acute vascular abnormality. Incidental imaging of the upper abdomen is within normal limits. Elevation of the left hemidiaphragm is present.  Exaggerated thoracic kyphosis with mid thoracic severe spondylosis. Mild wedging is probably chronic and degenerative. No displaced rib fractures are identified. There is no pneumothorax. The clavicles and scapula appear intact bilaterally. No sternal fracture.  IMPRESSION: 1. Moderate loculated left pleural effusion. No cause is identified and the appearance along with a 14 mm superior segment right lower lobe pulmonary nodule raises the possibility of malignant effusion with the nodule in the right upper lobe representing hematogenous metastasis. Thoracentesis should be considered for cytology. Potentially, the superior segment right lower lobe nodule represents primary tumor (bronchogenic carcinoma) with contralateral spread of disease. 2. Large hiatal hernia in the right side of the chest. 3. Atherosclerosis.   Electronically Signed   By: Andreas Newport M.D.   On: 10/20/2013 18:25    Assessment/Plan Active Problems:   Pleural effusion   Lung mass   UTI (urinary tract infection)  67 year old female came to the hospital with worsening shortness of breath and found to have right-sided lung  mass as well as left-sided pleural effusion. Patient will need thoracentesis in a.m. We'll consult IR for diagnostic as well as  therapeutic thoracentesis to look for malignancy. In the meantime I will give the patient Lasix 20 g IV x1. We'll also obtain 3 sets of cardiac enzymes though the cardiac cause seems very unlikely. Patient also has a UTI with abnormal UA which was positive nitrite, will start the patient on Levaquin. We'll give Dilaudid when necessary for pain. Patient will be kept n.p.o. after midnight.   Code status: Patient is full code  Family discussion: Discussed with patient's cousin at bedside   Time Spent on Admission: 75 min  [facility] Pager: [phone] 10/20/2013, 11:05 PM  If 7PM-7AM, please contact night-coverage  [email or web address]  Password [email or web address]

## 2013-10-20 NOTE — ED Provider Notes (Signed)
CSN: [id number]     Arrival date & time 10/20/13  1302 History   This chart was scribed for Donnetta Hutching, MD, by Yevette Edwards, ED Scribe. This patient was seen in room APA12/APA12 and the patient's care was started at 1:28 PM First MD Initiated Contact with Patient 10/20/13 1315     Chief Complaint  Patient presents with  . Abdominal Pain   (Consider location/radiation/quality/duration/timing/severity/associated sxs/prior Treatment) The history is provided by the patient and a relative. No language interpreter was used.   HPI Comments: Rachel Dodson is a 67 y.o. female, brought in via EMS, who presents to the Emergency Department complaining of front anterior chest pain she characterizes as "sharp" and which has been occurring intermittent for several days.  She also reports that she fell in the shower three days ago. The pt reports that she falls often. She informed the nurse that she also had a syncopal episode recently in which she suffered a head impact. She also reports diffuse abdominal pain, back pain, increased weakness, intermittent SOB, and dizziness. She takes cymbalta and blood pressure medication. She denies taking any hydrocodone.  The pt lives independently. Her cousin reports that there are two animals in the house which often urinate in the house.  He also believes that she has taken some type of medication which is affecting her behavior. He expressed concern that she is unable to care for herself adequately.  The pt has a h/o depression. She is a former smoker.  Dr. Berton Mount is her PCP.  Past Medical History  Diagnosis Date  . Hypertension   . Depression   . Anxiety    Past Surgical History  Procedure Laterality Date  . Abdominal hysterectomy     No family history on file. History  Substance Use Topics  . Smoking status: Former Smoker    Types: Cigarettes  . Smokeless tobacco: Not on file  . Alcohol Use: No   No OB history provided.  Review of Systems   Constitutional: Negative for fever and chills.  Respiratory: Positive for shortness of breath.   Cardiovascular: Positive for chest pain.  Gastrointestinal: Positive for abdominal pain.  Musculoskeletal: Positive for back pain and myalgias.  Neurological: Positive for dizziness, syncope and weakness.    Allergies  Review of patient's allergies indicates no known allergies.  Home Medications   Current Outpatient Rx  Name  Route  Sig  Dispense  Refill  . ALPRAZolam (XANAX) 0.5 MG tablet   Oral   Take 0.5 mg by mouth at bedtime as needed for sleep.         Marland Kitchen amLODipine (NORVASC) 5 MG tablet   Oral   Take 5 mg by mouth daily.          Triage Vitals: BP 142/68  Pulse 105  Temp(Src) 98.5 F (36.9 C) (Oral)  Resp 18  SpO2 96%  Physical Exam  Nursing note and vitals reviewed. Constitutional: She is oriented to person, place, and time. She appears well-developed and well-nourished.  Pale   HENT:  Head: Normocephalic and atraumatic.  Eyes: Conjunctivae and EOM are normal. Pupils are equal, round, and reactive to light.  Neck: Normal range of motion. Neck supple.  Cardiovascular: Normal rate, regular rhythm and normal heart sounds.   Pulmonary/Chest: Effort normal and breath sounds normal.  Abdominal: Soft. Bowel sounds are normal.  Musculoskeletal: Normal range of motion.  Neurological: She is alert and oriented to person, place, and time.  Answered questions slowly, but reasonably  and appropriately.   Skin: Skin is warm and dry.  Psychiatric: She has a normal mood and affect.    ED Course  Procedures (including critical care time)  DIAGNOSTIC STUDIES: Oxygen Saturation is 96% on room air, normal by my interpretation.    COORDINATION OF CARE:  1:35 PM- Discussed treatment plan with patient and the pt's cousin, and they agreed to the plan.   Labs Review Labs Reviewed  CBC WITH DIFFERENTIAL - Abnormal; Notable for the following:    WBC 23.5 (*)    Neutrophils  Relative % 88 (*)    Neutro Abs 20.7 (*)    Lymphocytes Relative 5 (*)    Monocytes Absolute 1.6 (*)    All other components within normal limits  BASIC METABOLIC PANEL - Abnormal; Notable for the following:    Sodium 131 (*)    Potassium 3.4 (*)    Chloride 92 (*)    Glucose, Bld 141 (*)    GFR calc non Af Amer 56 (*)    GFR calc Af Amer 65 (*)    All other components within normal limits  URINE CULTURE  TROPONIN I  URINALYSIS, ROUTINE W REFLEX MICROSCOPIC   Imaging Review Dg Chest 2 View  10/20/2013   CLINICAL DATA:  Short of breath, chest pain  EXAM: CHEST  2 VIEW  COMPARISON:  None.  FINDINGS: Cardiac silhouette is enlarged. There is a large right pleural effusion. There is associated left lower lobe atelectasis. Right lung is relatively clear. Large hiatal hernia is also noted.  IMPRESSION: 1. Large left effusion with associated atelectasis. Findings may warrant a contrast CT thorax for further evaluation as no comparison available. 2. Large hiatal hernia.   Electronically Signed   By: Genevive Bi M.D.   On: 10/20/2013 15:17    EKG Interpretation   None       Date: 10/20/2013  Rate: 106  Rhythm:  Sinus tachy  QRS Axis: normal  Intervals: normal  ST/T Wave abnormalities: diffuse TWI  Conduction Disutrbances: RBBB  Narrative Interpretation: unremarkable  Dg Chest 2 View  10/20/2013   CLINICAL DATA:  Short of breath, chest pain  EXAM: CHEST  2 VIEW  COMPARISON:  None.  FINDINGS: Cardiac silhouette is enlarged. There is a large right pleural effusion. There is associated left lower lobe atelectasis. Right lung is relatively clear. Large hiatal hernia is also noted.  IMPRESSION: 1. Large left effusion with associated atelectasis. Findings may warrant a contrast CT thorax for further evaluation as no comparison available. 2. Large hiatal hernia.   Electronically Signed   By: Genevive Bi M.D.   On: 10/20/2013 15:17   Ct Chest W Contrast  10/20/2013   CLINICAL DATA:   Fall 10/17/2013. Anterior chest pain. Left pleural effusion on chest radiograph.  EXAM: CT CHEST WITH CONTRAST  TECHNIQUE: Multidetector CT imaging of the chest was performed during intravenous contrast administration.  CONTRAST:  80mL OMNIPAQUE IOHEXOL 300 MG/ML  SOLN  COMPARISON:  10/20/2013 radiograph.  FINDINGS: There is a moderate size loculated left pleural effusion present. This demonstrates low attenuation. The fluid appears simple. There is no pleural fluid identified contralateral size. There is a pulmonary nodule in the superior segment of the right lower lobe measuring 14 mm. This raises the possibility of malignant effusion on the left side with metastatic disease to the right lower lobe.  There is collapse/ consolidation of the lingula and left lower lobe. Small mediastinal lymph nodes are present, not pathologically enlarged. There  is no pericardial effusion.  There is a large hiatal hernia obtaining almost all of the stomach which is in the right side of the chest. Dense mitral annular calcification is present. Bilateral calcified breast implants are present. There is no axillary adenopathy. The aorta and branch vessels show atherosclerosis. No acute vascular abnormality. Incidental imaging of the upper abdomen is within normal limits. Elevation of the left hemidiaphragm is present.  Exaggerated thoracic kyphosis with mid thoracic severe spondylosis. Mild wedging is probably chronic and degenerative. No displaced rib fractures are identified. There is no pneumothorax. The clavicles and scapula appear intact bilaterally. No sternal fracture.  IMPRESSION: 1. Moderate loculated left pleural effusion. No cause is identified and the appearance along with a 14 mm superior segment right lower lobe pulmonary nodule raises the possibility of malignant effusion with the nodule in the right upper lobe representing hematogenous metastasis. Thoracentesis should be considered for cytology. Potentially, the  superior segment right lower lobe nodule represents primary tumor (bronchogenic carcinoma) with contralateral spread of disease. 2. Large hiatal hernia in the right side of the chest. 3. Atherosclerosis.   Electronically Signed   By: Andreas Newport M.D.   On: 10/20/2013 18:25    MDM  No diagnosis found. Patient has large loculated left pleural effusion. Also right lower lobe nodule noted. This could represent a new lung cancer. Patient is debilitated. Will admit.  I personally performed the services described in this documentation, which was scribed in my presence. The recorded information has been reviewed and is accurate.     Donnetta Hutching, MD 10/20/13 2030

## 2013-10-21 ENCOUNTER — Inpatient Hospital Stay (HOSPITAL_COMMUNITY): Payer: Medicare Other

## 2013-10-21 DIAGNOSIS — J90 Pleural effusion, not elsewhere classified: Secondary | ICD-10-CM

## 2013-10-21 DIAGNOSIS — R222 Localized swelling, mass and lump, trunk: Secondary | ICD-10-CM

## 2013-10-21 DIAGNOSIS — N390 Urinary tract infection, site not specified: Secondary | ICD-10-CM

## 2013-10-21 LAB — BODY FLUID CELL COUNT WITH DIFFERENTIAL: Lymphs, Fluid: 2 %

## 2013-10-21 LAB — COMPREHENSIVE METABOLIC PANEL
ALT: 11 U/L (ref 0–35)
Albumin: 3 g/dL — ABNORMAL LOW (ref 3.5–5.2)
Alkaline Phosphatase: 66 U/L (ref 39–117)
CO2: 27 mEq/L (ref 19–32)
Calcium: 9 mg/dL (ref 8.4–10.5)
GFR calc Af Amer: 67 mL/min — ABNORMAL LOW (ref >90)
Glucose, Bld: 130 mg/dL — ABNORMAL HIGH (ref 70–99)
Potassium: 3.5 mEq/L (ref 3.5–5.1)
Sodium: 130 mEq/L — ABNORMAL LOW (ref 135–145)
Total Protein: 6.4 g/dL (ref 6.0–8.3)

## 2013-10-21 LAB — URINE CULTURE: Colony Count: 75000

## 2013-10-21 LAB — CBC
Hemoglobin: 14.7 g/dL (ref 12.0–15.0)
MCH: 32.3 pg (ref 26.0–34.0)
MCHC: 34.7 g/dL (ref 30.0–36.0)
RDW: 13 % (ref 11.5–15.5)

## 2013-10-21 LAB — TROPONIN I
Troponin I: <0.3 ng/mL (ref <0.30)
Troponin I: <0.3 ng/mL (ref <0.30)

## 2013-10-21 LAB — PROTEIN, BODY FLUID: Total protein, fluid: 4.6 g/dL

## 2013-10-21 MED ORDER — LABETALOL HCL 5 MG/ML IV SOLN
5.0000 mg | INTRAVENOUS | Status: DC | PRN
  Filled 2013-10-21: qty 4

## 2013-10-21 MED ORDER — LEVOFLOXACIN IN D5W 500 MG/100ML IV SOLN
INTRAVENOUS | Status: AC
  Filled 2013-10-21: qty 100

## 2013-10-21 NOTE — Progress Notes (Signed)
Utilization Review Complete  

## 2013-10-21 NOTE — Progress Notes (Signed)
[facility] PROGRESS NOTE  Rachel Dodson:[id number] DOB: 1946-12-12 DOA: 10/20/2013 PCP: Cassell Smiles., MD  Assessment/Plan: 1. Pleural effusion 1. IR consulted for diagnostic/therapeutic thoracentesis 2. Will await study results 3. Cont symptom mgt as needed 2. Lung mass 1. Fluid to be sent for cytology 2. 14mm RLL nodule 3. UTI 1. On Levaquin 2. Follow cultures and sensitivities 4. HTN 1. BP stable 5. Hx tobacco abuse 1. Quit 4 months ago 2. Pt congratulated 6. DVT prophylaxis 1. SCD's  Code Status: Full Family Communication: Pt in room (indicate person spoken with, relationship, and if by phone, the number) Disposition Plan: Pending   Consultants:  IR  Procedures:  Pending IR guided thoracentesis  Antibiotics:  Levaquin 10/20/13>>>  HPI/Subjective: Continues with mild SOB with exertion. No other complaints  Objective: Filed Vitals:   10/20/13 2014 10/20/13 2144 10/20/13 2147 10/21/13 0540  BP: 122/58  105/53 114/60  Pulse: 94  83 109  Temp:   97.3 F (36.3 C) 98.8 F (37.1 C)  TempSrc:      Resp: 24  20 18   Height:  5\' 4"  (1.626 m)    Weight:   63.4 kg (139 lb 12.4 oz)   SpO2: 93%  97% 90%    Intake/Output Summary (Last 24 hours) at 10/21/13 1005 Last data filed at 10/21/13 0100  Gross per 24 hour  Intake      0 ml  Output    375 ml  Net   -375 ml   Filed Weights   10/20/13 2147  Weight: 63.4 kg (139 lb 12.4 oz)    Exam:   General:  Awake, in nad  Cardiovascular: regular, s1, s2  Respiratory: normal resp effort, no wheezing  Abdomen: soft, nondistneded  Musculoskeletal: perfused, no clubbing   Data Reviewed: Basic Metabolic Panel:  Recent Labs Lab 10/20/13 1319 10/21/13 0543  NA 131* 130*  K 3.4* 3.5  CL 92* 91*  CO2 27 27  GLUCOSE 141* 130*  BUN 21 22  CREATININE 1.01 0.98  CALCIUM 9.7 9.0   Liver Function Tests:  Recent Labs Lab 10/21/13 0543  AST 9  ALT 11  ALKPHOS 66  BILITOT 0.4  PROT  6.4  ALBUMIN 3.0*   No results found for this basename: LIPASE, AMYLASE,  in the last 168 hours No results found for this basename: AMMONIA,  in the last 168 hours CBC:  Recent Labs Lab 10/20/13 1319 10/21/13 0543  WBC 23.5* 29.1*  NEUTROABS 20.7*  --   HGB 14.6 14.7  HCT 42.2 42.4  MCV 93.0 93.2  PLT 302 281   Cardiac Enzymes:  Recent Labs Lab 10/20/13 1319 10/20/13 2331 10/21/13 0543  TROPONINI <0.30 <0.30 <0.30   BNP (last 3 results) No results found for this basename: PROBNP,  in the last 8760 hours CBG: No results found for this basename: GLUCAP,  in the last 168 hours  No results found for this or any previous visit (from the past 240 hour(s)).   Studies: Dg Chest 2 View  10/20/2013   CLINICAL DATA:  Short of breath, chest pain  EXAM: CHEST  2 VIEW  COMPARISON:  None.  FINDINGS: Cardiac silhouette is enlarged. There is a large right pleural effusion. There is associated left lower lobe atelectasis. Right lung is relatively clear. Large hiatal hernia is also noted.  IMPRESSION: 1. Large left effusion with associated atelectasis. Findings may warrant a contrast CT thorax for further evaluation as no comparison available. 2. Large hiatal hernia.  Electronically Signed   By: Genevive Bi M.D.   On: 10/20/2013 15:17   Ct Chest W Contrast  10/20/2013   CLINICAL DATA:  Fall 10/17/2013. Anterior chest pain. Left pleural effusion on chest radiograph.  EXAM: CT CHEST WITH CONTRAST  TECHNIQUE: Multidetector CT imaging of the chest was performed during intravenous contrast administration.  CONTRAST:  80mL OMNIPAQUE IOHEXOL 300 MG/ML  SOLN  COMPARISON:  10/20/2013 radiograph.  FINDINGS: There is a moderate size loculated left pleural effusion present. This demonstrates low attenuation. The fluid appears simple. There is no pleural fluid identified contralateral size. There is a pulmonary nodule in the superior segment of the right lower lobe measuring 14 mm. This raises the  possibility of malignant effusion on the left side with metastatic disease to the right lower lobe.  There is collapse/ consolidation of the lingula and left lower lobe. Small mediastinal lymph nodes are present, not pathologically enlarged. There is no pericardial effusion.  There is a large hiatal hernia obtaining almost all of the stomach which is in the right side of the chest. Dense mitral annular calcification is present. Bilateral calcified breast implants are present. There is no axillary adenopathy. The aorta and branch vessels show atherosclerosis. No acute vascular abnormality. Incidental imaging of the upper abdomen is within normal limits. Elevation of the left hemidiaphragm is present.  Exaggerated thoracic kyphosis with mid thoracic severe spondylosis. Mild wedging is probably chronic and degenerative. No displaced rib fractures are identified. There is no pneumothorax. The clavicles and scapula appear intact bilaterally. No sternal fracture.  IMPRESSION: 1. Moderate loculated left pleural effusion. No cause is identified and the appearance along with a 14 mm superior segment right lower lobe pulmonary nodule raises the possibility of malignant effusion with the nodule in the right upper lobe representing hematogenous metastasis. Thoracentesis should be considered for cytology. Potentially, the superior segment right lower lobe nodule represents primary tumor (bronchogenic carcinoma) with contralateral spread of disease. 2. Large hiatal hernia in the right side of the chest. 3. Atherosclerosis.   Electronically Signed   By: Andreas Newport M.D.   On: 10/20/2013 18:25    Scheduled Meds: . clonazePAM  0.5 mg Oral BID  . DULoxetine  60 mg Oral Daily  . levofloxacin (LEVAQUIN) IV  500 mg Intravenous Q24H  . pantoprazole  40 mg Oral BID  . sodium chloride  3 mL Intravenous Q12H   Continuous Infusions:   Active Problems:   Pleural effusion   Lung mass   UTI (urinary tract  infection)    Time spent:    CHIU, STEPHEN K  [facility] Pager [phone]. If 7PM-7AM, please contact night-coverage at [email or web address], password [facility] 10/21/2013, 10:05 AM  LOS: 1 day

## 2013-10-21 NOTE — Progress Notes (Signed)
Thoracentesis complete no signs of distress.  

## 2013-10-22 ENCOUNTER — Inpatient Hospital Stay (HOSPITAL_COMMUNITY): Payer: Medicare Other

## 2013-10-22 DIAGNOSIS — A419 Sepsis, unspecified organism: Principal | ICD-10-CM

## 2013-10-22 DIAGNOSIS — J90 Pleural effusion, not elsewhere classified: Secondary | ICD-10-CM

## 2013-10-22 LAB — COMPREHENSIVE METABOLIC PANEL
ALT: 10 U/L (ref 0–35)
AST: 12 U/L (ref 0–37)
Albumin: 2.4 g/dL — ABNORMAL LOW (ref 3.5–5.2)
Alkaline Phosphatase: 75 U/L (ref 39–117)
BUN: 35 mg/dL — ABNORMAL HIGH (ref 6–23)
CO2: 25 mEq/L (ref 19–32)
Calcium: 7.9 mg/dL — ABNORMAL LOW (ref 8.4–10.5)
Chloride: 92 mEq/L — ABNORMAL LOW (ref 96–112)
Creatinine, Ser: 1.44 mg/dL — ABNORMAL HIGH (ref 0.50–1.10)
GFR calc Af Amer: 42 mL/min — ABNORMAL LOW (ref >90)
GFR calc non Af Amer: 36 mL/min — ABNORMAL LOW (ref >90)
Glucose, Bld: 99 mg/dL (ref 70–99)
Potassium: 3.5 mEq/L (ref 3.5–5.1)
Sodium: 130 mEq/L — ABNORMAL LOW (ref 135–145)
Total Bilirubin: 0.3 mg/dL (ref 0.3–1.2)
Total Protein: 5.8 g/dL — ABNORMAL LOW (ref 6.0–8.3)

## 2013-10-22 LAB — CBC
HCT: 38 % (ref 36.0–46.0)
Hemoglobin: 13.1 g/dL (ref 12.0–15.0)
MCH: 32.2 pg (ref 26.0–34.0)
MCHC: 34.5 g/dL (ref 30.0–36.0)
MCV: 93.4 fL (ref 78.0–100.0)
Platelets: 259 10*3/uL (ref 150–400)
RBC: 4.07 MIL/uL (ref 3.87–5.11)
RDW: 13.1 % (ref 11.5–15.5)
WBC: 26.9 10*3/uL — ABNORMAL HIGH (ref 4.0–10.5)

## 2013-10-22 LAB — GLUCOSE, CAPILLARY: Glucose-Capillary: 97 mg/dL (ref 70–99)

## 2013-10-22 LAB — APTT: aPTT: 34 seconds (ref 24–37)

## 2013-10-22 LAB — CBC WITH DIFFERENTIAL/PLATELET
Eosinophils Relative: 0 % (ref 0–5)
HCT: 40.9 % (ref 36.0–46.0)
Lymphocytes Relative: 4 % — ABNORMAL LOW (ref 12–46)
Lymphs Abs: 1.2 10*3/uL (ref 0.7–4.0)
MCH: 32 pg (ref 26.0–34.0)
MCV: 93.6 fL (ref 78.0–100.0)
Monocytes Absolute: 2.2 10*3/uL — ABNORMAL HIGH (ref 0.1–1.0)
Platelets: 297 10*3/uL (ref 150–400)
RBC: 4.37 MIL/uL (ref 3.87–5.11)
WBC: 31 10*3/uL — ABNORMAL HIGH (ref 4.0–10.5)

## 2013-10-22 LAB — ABO/RH: ABO/RH(D): A POS

## 2013-10-22 LAB — PROTIME-INR
INR: 1.36 (ref 0.00–1.49)
Prothrombin Time: 16.4 s — ABNORMAL HIGH (ref 11.6–15.2)

## 2013-10-22 LAB — TYPE AND SCREEN
ABO/RH(D): A POS
Antibody Screen: NEGATIVE

## 2013-10-22 MED ORDER — ONDANSETRON HCL 4 MG/2ML IJ SOLN
4.0000 mg | Freq: Four times a day (QID) | INTRAMUSCULAR | Status: DC | PRN
  Administered 2013-10-22 – 2013-10-25 (×2): 4 mg via INTRAVENOUS
  Filled 2013-10-22 (×2): qty 2

## 2013-10-22 MED ORDER — CHLORHEXIDINE GLUCONATE CLOTH 2 % EX PADS: 6.0000 | MEDICATED_PAD | Freq: Every day | CUTANEOUS | Status: DC

## 2013-10-22 MED ORDER — ONDANSETRON HCL 4 MG PO TABS: 4.0000 mg | ORAL_TABLET | Freq: Three times a day (TID) | ORAL | Status: DC | PRN

## 2013-10-22 MED ORDER — PROMETHAZINE HCL 25 MG/ML IJ SOLN
25.0000 mg | INTRAMUSCULAR | Status: DC | PRN
  Administered 2013-10-22: 25 mg via INTRAVENOUS
  Filled 2013-10-22: qty 1

## 2013-10-22 MED ORDER — SODIUM CHLORIDE 0.9 % IV BOLUS (SEPSIS)
500.0000 mL | Freq: Once | INTRAVENOUS | Status: AC
  Administered 2013-10-22: 500 mL via INTRAVENOUS

## 2013-10-22 MED ORDER — IOHEXOL 300 MG/ML  SOLN
50.0000 mL | Freq: Once | INTRAMUSCULAR | Status: AC | PRN
  Administered 2013-10-22: 50 mL via ORAL

## 2013-10-22 MED ORDER — CIPROFLOXACIN HCL 500 MG PO TABS: 500.0000 mg | ORAL_TABLET | Freq: Two times a day (BID) | ORAL | Status: DC

## 2013-10-22 MED ORDER — SODIUM CHLORIDE 0.9 % IV SOLN: INTRAVENOUS | Status: DC

## 2013-10-22 MED ORDER — CIPROFLOXACIN IN D5W 400 MG/200ML IV SOLN
400.0000 mg | Freq: Two times a day (BID) | INTRAVENOUS | Status: DC
  Administered 2013-10-22 – 2013-10-25 (×6): 400 mg via INTRAVENOUS
  Filled 2013-10-22 (×9): qty 200

## 2013-10-22 MED ORDER — DEXTROSE 5 % IV SOLN
1.5000 g | INTRAVENOUS | Status: AC
  Administered 2013-10-23: 1.5 g via INTRAVENOUS
  Filled 2013-10-22 (×2): qty 1.5

## 2013-10-22 MED ORDER — IOHEXOL 300 MG/ML  SOLN
100.0000 mL | Freq: Once | INTRAMUSCULAR | Status: AC | PRN
  Administered 2013-10-22: 100 mL via INTRAVENOUS

## 2013-10-22 NOTE — Progress Notes (Signed)
Pt noted to be O2 dependent, w/ sats of 87% on RA despite claiming to be breathing better. F/u cxr post thoracentesis demonstrates worsening L pleural effusion. Thus far, fluid analysis suggestive of exudative fluid. Fluid culture so far w/o growth. Cytology pending. Discussed case with pulm/critical care  who recommends formal evaluation with CT surg. Discussed case with CT surgery. Recs for transfer to [facility] for possible VATS and bronch tomorrow. Discussed case with accepting hospitalist. Pt to be transferred to stepdown floor, to be kept NPO after midnight for possible VATS and bronch tomorrow. Family updated and are in agreement with care.

## 2013-10-22 NOTE — Progress Notes (Signed)
ANTIBIOTIC CONSULT NOTE - INITIAL  Pharmacy Consult for Cipro  Indication: UTI with sepsis  Allergies  Allergen Reactions  . Latex     Patient Measurements: Height: 5\' 4"  (162.6 cm) Weight: 139 lb 12.4 oz (63.4 kg) IBW/kg (Calculated) : 54.7 Adjusted Body Weight:   Vital Signs: Temp: 98 F (36.7 C) (10/30 0447) Temp src: Oral (10/30 0447) BP: 96/52 mmHg (10/30 0447) Pulse Rate: 102 (10/30 0447) Intake/Output from previous day: 10/29 0701 - 10/30 0700 In: 240 [P.O.:240] Out: 600 [Urine:600] Intake/Output from this shift:    Labs:  Recent Labs  10/20/13 1319 10/21/13 0543 10/22/13 1249  WBC 23.5* 29.1* 31.0*  HGB 14.6 14.7 14.0  PLT 302 281 297  CREATININE 1.01 0.98  --    Estimated Creatinine Clearance: 47.4 ml/min (by C-G formula based on Cr of 0.98). No results found for this basename: VANCOTROUGH, Leodis Binet, VANCORANDOM, GENTTROUGH, GENTPEAK, GENTRANDOM, TOBRATROUGH, TOBRAPEAK, TOBRARND, AMIKACINPEAK, AMIKACINTROU, AMIKACIN,  in the last 72 hours   Microbiology: Recent Results (from the past 720 hour(s))  URINE CULTURE     Status: None   Collection Time    10/20/13  3:24 PM      Result Value Range Status   Specimen Description URINE, CLEAN CATCH   Final   Special Requests NONE   Final   Culture  Setup Time     Final   Value: 10/20/2013 17:23     Performed at [facility] Count     Final   Value: 75,000 COLONIES/ML     Performed at [facility]   Culture     Final   Value: ESCHERICHIA COLI     Performed at [facility]   Report Status 10/21/2013 FINAL   Final   Organism ID, Bacteria ESCHERICHIA COLI   Final  BODY FLUID CULTURE     Status: None   Collection Time    10/21/13  3:45 PM      Result Value Range Status   Specimen Description FLUID LEFT PLEURAL   Final   Special Requests NONE   Final   Gram Stain     Final   Value: WBC PRESENT, PREDOMINANTLY PMN     NO ORGANISMS SEEN     CYTOSPIN     Performed at  [facility]   Culture PENDING   Incomplete   Report Status PENDING   Incomplete    Medical History: Past Medical History  Diagnosis Date  . Hypertension   . Depression   . Anxiety     Medications:  Scheduled:  . ciprofloxacin  400 mg Intravenous Q12H  . clonazePAM  0.5 mg Oral BID  . DULoxetine  60 mg Oral Daily  . pantoprazole  40 mg Oral BID  . sodium chloride  3 mL Intravenous Q12H   Assessment: Change from Levaquin to Cipro CrCl 47.4 ml/min  Goal of Therapy:  Eradicate infection  Plan:  Cipro 400 mg IV every 12 hours. Change to oral therapy when appropriate Labs per protocol  Raquel James, Araf Clugston Bennett 10/22/2013,1:49 PM

## 2013-10-22 NOTE — Progress Notes (Signed)
[name] was assessed while she was at rest, without O2 she dropped to 87% on RA, with 2L of O2 via [place], she came up to 91-93%. Rachel Dodson

## 2013-10-22 NOTE — Progress Notes (Signed)
Discussed with Dr. Rhona Leavens.  Patient with lung mass and pleural effusion.  requires transfer to [facility] from [facility] for VATS procedure in probable bronchoscopy 10/31. He has spoken with Dr. Tyrone Sage requests transfer to hospitalist service and he will consult.  the patient is hemodynamically stable but will be transferred to 3300 per Dr. Tyrone Sage request.  patient accepted to [facility] team 10.  Crista Curb, M.D. [phone]

## 2013-10-22 NOTE — Consult Note (Addendum)
[address]       [place] [address]             [id number]                    MARGAN ELIAS [facility] Medical Record #[id number] Date of Birth: 08/07/1946  Referring: No ref. provider found Primary Care: Cassell Smiles., MD  Chief Complaint:    Chief Complaint  Patient presents with  . Abdominal Pain    History of Present Illness:    67 year old female admitted via  ED  At [facility] with a chief complaint of weakness and ongoing shortness of breath, back pain dizziness. She notes that she has had two episodes of "fallin out" over the past 2 weeks. Patient says that she fell in the shower 3 days ago. She also admits to having some chest pain associated with shortness of breath. Patient lives by herself, patient is a former smoker quit 4 months ago.  chest x-ray was done which showed large effusion on the left side and it was followed by CT chest which showed moderate loculated left pleural effusion and appearance of 40 mm superior segment right lower lobe pulmonary nodule. Thoracentesis was done yesterday and only 240 ml was removed, culture negative. Denies fever or chills      Current Activity/ Functional Status:  Patient is independent with mobility/ambulation, transfers, ADL's, IADL's.  On admission family noted they are not sure she can take care of her self, currently lives alone.  Zubrod Score: At the time of surgery this patient's most appropriate activity status/level should be described as: []  Normal activity, no symptoms []  Symptoms, fully ambulatory [x]  Symptoms, in bed less than or equal to 50% of the time []  Symptoms, in bed greater than 50% of the time but less than 100% []  Bedridden []  Moribund   Past Medical History  Diagnosis Date  . Hypertension   . Depression   . Anxiety     Past Surgical History  Procedure Laterality Date  . Abdominal hysterectomy      No family history on file.  History   Social History  .  Marital Status: Divorced    Spouse Name: N/A    Number of Children: N/A  . Years of Education: N/A   Occupational History  . Not on file.   Social History Main Topics  . Smoking status: Former Smoker    Types: Cigarettes  . Smokeless tobacco: Not on file  . Alcohol Use: No  . Drug Use: No  . Sexual Activity: Not on file   Other Topics Concern  . Not on file   Social History Narrative  . No narrative on file         Allergies  Allergen Reactions  . Latex     Current Facility-Administered Medications  Medication Dose Route Frequency Provider Last Rate Last Dose  . 0.9 %  sodium chloride infusion  250 mL Intravenous PRN Meredeth Ide, MD      . ALPRAZolam Prudy Feeler) tablet 0.5 mg  0.5 mg Oral QHS PRN Meredeth Ide, MD   0.5 mg at 10/21/13 2021  . ciprofloxacin (CIPRO) IVPB 400 mg  400 mg Intravenous Q12H Jerald Kief, MD   400 mg at 10/22/13 1532  . clonazePAM (KLONOPIN) tablet 0.5 mg  0.5 mg Oral BID Meredeth Ide, MD   0.5 mg at 10/22/13 1041  . DULoxetine (CYMBALTA) DR  capsule 60 mg  60 mg Oral Daily Meredeth Ide, MD   60 mg at 10/22/13 1041  . HYDROmorphone (DILAUDID) injection 1 mg  1 mg Intravenous Q4H PRN Meredeth Ide, MD   1 mg at 10/22/13 0740  . labetalol (NORMODYNE,TRANDATE) injection 5 mg  5 mg Intravenous Q10 min PRN Jerald Kief, MD      . ondansetron [facility]) injection 4 mg  4 mg Intravenous Q6H PRN Jerald Kief, MD   4 mg at 10/22/13 1041  . pantoprazole (PROTONIX) EC tablet 40 mg  40 mg Oral BID Meredeth Ide, MD   40 mg at 10/22/13 1041  . promethazine (PHENERGAN) injection 25 mg  25 mg Intravenous Q4H PRN Jerald Kief, MD   25 mg at 10/22/13 1531  . sodium chloride 0.9 % injection 3 mL  3 mL Intravenous Q12H Meredeth Ide, MD   3 mL at 10/22/13 1042  . sodium chloride 0.9 % injection 3 mL  3 mL Intravenous PRN Meredeth Ide, MD           Review of Systems:     Cardiac Review of Systems: Y or N  Chest Pain [ y   ]  Resting SOB [ y  ] Exertional SOB   [ y ]  Pollyann Kennedy [  y]   Pedal Edema [ n  ]    Palpitations [ n ] Syncope  Cove.Etienne  ]   Presyncope [   ]  General Review of Systems: [Y] = yes [  ]=no Constitional: recent weight change Cove.Etienne  ]; anorexia [y]; fatigue [ y]; nausea [ y ]; night sweats [n  ]; fever [ n ]; or chills [n  ];                                                                                                                                          Dental: poor dentition[y  ]; Last Dentist visit:   Eye : blurred vision [  ]; diplopia [   ]; vision changes [  ];  Amaurosis fugax[n ]; Resp: cough [  ];  wheezing[  ];  hemoptysis[  ]; shortness of breath[  ]; paroxysmal nocturnal dyspnea[y  ]; dyspnea on exertion[y  ]; or orthopnea[  ];  GI:  gallstones[  ], vomiting[  ];  dysphagia[  ]; melena[  ];  hematochezia [  ]; heartburn[  ];   Hx of  Colonoscopy[n ]; GU: kidney stones [  ]; hematuria[  ];   dysuria [  ];  nocturia[  ];  history of     obstruction [n  ]; urinary frequency [n  ]             Skin: rash, swelling[  ];, hair loss[  ];  peripheral edema[  ];  or itching[  ]; Musculosketetal: myalgias[  ];  joint swelling[  ];  joint erythema[  ];  joint pain[  ];  back pain[  ];  Heme/Lymph: bruising[  ];  bleeding[  ];  anemia[  ];  Neuro: TIA[  ];  headaches[  ];  stroke[  ];  vertigo[  ];  seizures[n  ];   paresthesias[  ];  difficulty walking[ y ];  Psych:depression[ yes ]; anxiety[  ];  Endocrine: diabetes[  ];  thyroid dysfunction[  ];  Immunizations: Flu Milo.Brash  ]; Pneumococcal[ n ];  Other:  Physical Exam: BP 95/63  Pulse 109  Temp(Src) 97.9 F (36.6 C) (Axillary)  Resp 20  Ht 5\' 4"  (1.626 m)  Wt 139 lb 12.4 oz (63.4 kg)  BMI 23.98 kg/m2  SpO2 91%  General appearance: alert, cooperative, no distress and slowed mentation Neurologic: intact Heart: regular rate and rhythm, S1, S2 normal, no murmur, click, rub or gallop Lungs: diminished breath sounds LLL Abdomen: soft, non-tender; bowel sounds normal; no masses,  no  organomegaly Extremities: extremities normal, atraumatic, no cyanosis or edema and Homans sign is negative, no sign of DVT no adenoptathy, cervical or axillary   Diagnostic Studies & Laboratory data:     Recent Radiology Findings:   Dg Chest 1 View  10/21/2013   CLINICAL DATA:  Pleural effusion status post thoracentesis.  EXAM: CHEST - 1 VIEW  COMPARISON:  CHEST x-ray 08/03/2027 2014.  FINDINGS: Lung volumes are low on this expiratory film. Previously noted large left pleural effusion has increased, despite the interval thoracentesis. The majority of the left lung appears completely collapsed or consolidated, with exception of the apex of the left upper lobe. No definite pneumothorax. Right lung appears clear. Cardiac silhouette is largely obscured, but the heart side is heart size is borderline enlarged. Massive hiatal hernia occupying much of the lower medial right hemithorax. Upper mediastinal contours are distorted by patient positioning. Atherosclerosis in the thoracic aorta.  IMPRESSION: 1. No definite postprocedural pneumothorax identified. 2. Enlarging left pleural effusion despite recent thoracentesis. 3. Large hiatal hernia again noted. 4. Atherosclerosis.   Electronically Signed   By: Trudie Reed M.D.   On: 10/21/2013 16:16   Ct Chest W Contrast  10/20/2013   CLINICAL DATA:  Fall 10/17/2013. Anterior chest pain. Left pleural effusion on chest radiograph.  EXAM: CT CHEST WITH CONTRAST  TECHNIQUE: Multidetector CT imaging of the chest was performed during intravenous contrast administration.  CONTRAST:  80mL OMNIPAQUE IOHEXOL 300 MG/ML  SOLN  COMPARISON:  10/20/2013 radiograph.  FINDINGS: There is a moderate size loculated left pleural effusion present. This demonstrates low attenuation. The fluid appears simple. There is no pleural fluid identified contralateral size. There is a pulmonary nodule in the superior segment of the right lower lobe measuring 14 mm. This raises the possibility  of malignant effusion on the left side with metastatic disease to the right lower lobe.  There is collapse/ consolidation of the lingula and left lower lobe. Small mediastinal lymph nodes are present, not pathologically enlarged. There is no pericardial effusion.  There is a large hiatal hernia obtaining almost all of the stomach which is in the right side of the chest. Dense mitral annular calcification is present. Bilateral calcified breast implants are present. There is no axillary adenopathy. The aorta and branch vessels show atherosclerosis. No acute vascular abnormality. Incidental imaging of the upper abdomen is within normal limits. Elevation of the left hemidiaphragm is present.  Exaggerated thoracic kyphosis with mid thoracic severe spondylosis. Mild wedging is probably chronic  and degenerative. No displaced rib fractures are identified. There is no pneumothorax. The clavicles and scapula appear intact bilaterally. No sternal fracture.  IMPRESSION: 1. Moderate loculated left pleural effusion. No cause is identified and the appearance along with a 14 mm superior segment right lower lobe pulmonary nodule raises the possibility of malignant effusion with the nodule in the right upper lobe representing hematogenous metastasis. Thoracentesis should be considered for cytology. Potentially, the superior segment right lower lobe nodule represents primary tumor (bronchogenic carcinoma) with contralateral spread of disease. 2. Large hiatal hernia in the right side of the chest. 3. Atherosclerosis.   Electronically Signed   By: Andreas Newport M.D.   On: 10/20/2013 18:25   US Thoracentesis Asp Pleural Space W/img Guide  10/21/2013   CLINICAL DATA:  Left pleural effusion.  EXAM: ULTRASOUND GUIDED LEFT-SIDED THORACENTESIS  COMPARISON:  None.  FINDINGS: A total of approximately 280 mL of cloudy yellow with internal debris fluid was removed. A fluid sample was sent for laboratory analysis.  IMPRESSION: Successful  ultrasound guided left thoracentesis yielding 280 mL of pleural fluid.  PROCEDURE: An ultrasound guided thoracentesis was thoroughly discussed with the patient and questions answered. The benefits, risks, alternatives and complications were also discussed. The patient understood and wished to proceed with the procedure. Written consent was obtained.  Ultrasound was performed to localize and mark an adequate pocket of fluid in the left chest. The area was then prepped and draped in the normal sterile fashion. 1% Lidocaine was used for local anesthesia. Under ultrasound guidance a 7-French Yueh centesis catheter was introduced. Thoracentesis was performed. The catheter was removed and a dressing applied.  Complications:  None   Electronically Signed   By: Trudie Reed M.D.   On: 10/21/2013 16:36      Recent Lab Findings: Lab Results  Component Value Date   WBC 31.0* 10/22/2013   HGB 14.0 10/22/2013   HCT 40.9 10/22/2013   PLT 297 10/22/2013   GLUCOSE 130* 10/21/2013   ALT 11 10/21/2013   AST 9 10/21/2013   NA 130* 10/21/2013   K 3.5 10/21/2013   CL 91* 10/21/2013   CREATININE 0.98 10/21/2013   BUN 22 10/21/2013   CO2 27 10/21/2013   cr .98 to 1.44 Acute Kidney Injury (any one)  Increase in SCr by > 0.3 within 48 hours  Increase SCr to > 1.5 times baseline  Urine volume < 0.5 ml/kg/h for 6 hrs  Stage:  Risk:   1.5x increase in creatinine or GFR decrease by 25% or UOP <0.70ml/kgperhr for 6 hrs  Injury:  2x increase in creatinine or GFR decrease by 50% or UOP < 0.67ml/kgperhr for 12 hr  Failure:3X increase in creatinine or GFR decrease by 75% or UOP < 0.17ml/kgperhr for 12 hr or                anuria 12 hrs  Loss: complete loss of kidney  function for more then 4 weeks  End-stage renal disease:Complete loss of kidney function for more then 3 months  Lab Results  Component Value Date   CREATININE 1.44* 10/22/2013   CREATININE: 1.44 mg/dL ABNORMAL (40/98/11 9147) Estimated creatinine  clearance - Cockcroft-Gault CrCl: 32.3 mL/min     Chronic Kidney Disease :Stage IIIB GFR 30-44  Stage I     GFR >90  Stage II    GFR 60-89  Stage IIIA GFR 45-59  Stage IIIB GFR 30-44  Stage IV   GFR 15-29  Stage V  GFR  <15  Lab Results  Component Value Date   CREATININE 1.44* 10/22/2013   Estimated Creatinine Clearance: 32.3 ml/min (by C-G formula based on Cr of 1.44).    Assessment / Plan:   Large left pleural effusion, rt lung mass- poss malignant effusion vs infection Rt Lung Mass  Leucocytosis atherosclerosis  of aorta on ct scan Depression on cymbalta UTI- treated  Reported sepsis on admission to [facility], not evident now large hiatal hernia with stomach in right chest AKI- risk level  on chronic kidney disease IIIB   I have recommended, bronch with left VATS for drainage of effusion and bx, possible talc if malignant. The goals risks and alternatives of the planned surgical procedurebronch with left VATS for drainage of effusion and bx, possible talc if malignant. I have been discussed with the patient in detail. The risks of the procedure including death, infection, stroke, myocardial infarction, bleeding, blood transfusion have all been discussed specifically.  I have quoted Krystal Eaton a 5 % of perioperative mortality and a complication rate as high as 25 %. The patient's questions have been answered.JULIE NAY is willing  to proceed with the planned procedure.  Will need case manager to arrange post discharge care/ ?SNF    Delight Ovens MD      [address] [place] [place] [address] Office [phone]   Beeper [phone]  8:58 PM

## 2013-10-22 NOTE — Progress Notes (Signed)
OT Cancellation Note  Patient Details Name: CHERYLL KEISLER MRN: [id number] DOB: May 12, 1946   Cancelled Treatment:     Attempted to see patient this date.  Patient in process of drinking liquids for scan this afternoon with complaint of nausea and feeling like she would vomit if moved.  Declines OT evaluation at this time.  Will follow patient for evaluation as available/able.  Thank you for this referral.    Velora Mediate, OTR/L  10/22/2013, 1:58 PM

## 2013-10-22 NOTE — Progress Notes (Addendum)
[facility] PROGRESS NOTE  Rachel Dodson ZOX:[id number] DOB: May 01, 1946 DOA: 10/20/2013 PCP: Cassell Smiles., MD  Assessment/Plan: Pleural effusion  1. IR consulted for diagnostic/therapeutic thoracentesis - s/p 280cc on 10/29 2. Fluid analysis suggestive of exudate 3. Culture and cytology pending 4. Cont symptom mgt as needed Lung mass  1. Fluid to be sent for cytology - pending 2. 14mm RLL nodule on CT 3. Pending results UTI with sepsis 1. WBC rising to 31K with tachycardia 2. IVF hydration 3. Will continue on IV cipro given worsening WBC 4. Blood cx pending 5. Urine cx with multidrug sensitive ecoli HTN  1. BP stable Hx tobacco abuse  1. Quit 4 months ago 2. Pt congratulated DVT prophylaxis  1. SCD's  Code Status: Full Family Communication: Pt in room (indicate person spoken with, relationship, and if by phone, the number) Disposition Plan: Pending  Procedures:  Thoracentesis 10/21/13  Antibiotics:  Levaquin 10/20/13>>>10/21/13  Ciprofloxacin 10/21/13>>>  HPI/Subjective: Complains of mild nausea. Reported breathing better.  Objective: Filed Vitals:   10/21/13 1555 10/21/13 2037 10/21/13 2342 10/22/13 0447  BP: 92/64 92/53 98/64  96/52  Pulse: 118 101  102  Temp:  98.5 F (36.9 C)  98 F (36.7 C)  TempSrc:  Axillary  Oral  Resp: 20 20  20   Height:      Weight:      SpO2: 93% 94%  93%    Intake/Output Summary (Last 24 hours) at 10/22/13 1357 Last data filed at 10/21/13 1428  Gross per 24 hour  Intake      0 ml  Output    300 ml  Net   -300 ml   Filed Weights   10/20/13 2147  Weight: 63.4 kg (139 lb 12.4 oz)    Exam:   General:  Awake, in nad  Cardiovascular: regular, s1, s2  Respiratory: normal resp effort, no wheezing  Abdomen: soft, nondistended  Musculoskeletal: perfused, no clubbing   Data Reviewed: Basic Metabolic Panel:  Recent Labs Lab 10/20/13 1319 10/21/13 0543  NA 131* 130*  K 3.4* 3.5  CL 92* 91*  CO2  27 27  GLUCOSE 141* 130*  BUN 21 22  CREATININE 1.01 0.98  CALCIUM 9.7 9.0   Liver Function Tests:  Recent Labs Lab 10/21/13 0543  AST 9  ALT 11  ALKPHOS 66  BILITOT 0.4  PROT 6.4  ALBUMIN 3.0*   No results found for this basename: LIPASE, AMYLASE,  in the last 168 hours No results found for this basename: AMMONIA,  in the last 168 hours CBC:  Recent Labs Lab 10/20/13 1319 10/21/13 0543 10/22/13 1249  WBC 23.5* 29.1* 31.0*  NEUTROABS 20.7*  --  PENDING  HGB 14.6 14.7 14.0  HCT 42.2 42.4 40.9  MCV 93.0 93.2 93.6  PLT 302 281 297   Cardiac Enzymes:  Recent Labs Lab 10/20/13 1319 10/20/13 2331 10/21/13 0543 10/21/13 1123  TROPONINI <0.30 <0.30 <0.30 <0.30   BNP (last 3 results)  Recent Labs  10/21/13 1123  PROBNP 642.7*   CBG: No results found for this basename: GLUCAP,  in the last 168 hours  Recent Results (from the past 240 hour(s))  URINE CULTURE     Status: None   Collection Time    10/20/13  3:24 PM      Result Value Range Status   Specimen Description URINE, CLEAN CATCH   Final   Special Requests NONE   Final   Culture  Setup Time     Final  Value: 10/20/2013 17:23     Performed at [facility] Count     Final   Value: 75,000 COLONIES/ML     Performed at [facility]   Culture     Final   Value: ESCHERICHIA COLI     Performed at [facility]   Report Status 10/21/2013 FINAL   Final   Organism ID, Bacteria ESCHERICHIA COLI   Final  BODY FLUID CULTURE     Status: None   Collection Time    10/21/13  3:45 PM      Result Value Range Status   Specimen Description FLUID LEFT PLEURAL   Final   Special Requests NONE   Final   Gram Stain     Final   Value: WBC PRESENT, PREDOMINANTLY PMN     NO ORGANISMS SEEN     CYTOSPIN     Performed at [facility]   Culture PENDING   Incomplete   Report Status PENDING   Incomplete     Studies: Dg Chest 1 View  10/21/2013   CLINICAL DATA:  Pleural effusion  status post thoracentesis.  EXAM: CHEST - 1 VIEW  COMPARISON:  CHEST x-ray 08/03/2027 2014.  FINDINGS: Lung volumes are low on this expiratory film. Previously noted large left pleural effusion has increased, despite the interval thoracentesis. The majority of the left lung appears completely collapsed or consolidated, with exception of the apex of the left upper lobe. No definite pneumothorax. Right lung appears clear. Cardiac silhouette is largely obscured, but the heart side is heart size is borderline enlarged. Massive hiatal hernia occupying much of the lower medial right hemithorax. Upper mediastinal contours are distorted by patient positioning. Atherosclerosis in the thoracic aorta.  IMPRESSION: 1. No definite postprocedural pneumothorax identified. 2. Enlarging left pleural effusion despite recent thoracentesis. 3. Large hiatal hernia again noted. 4. Atherosclerosis.   Electronically Signed   By: Trudie Reed M.D.   On: 10/21/2013 16:16   Dg Chest 2 View  10/20/2013   CLINICAL DATA:  Short of breath, chest pain  EXAM: CHEST  2 VIEW  COMPARISON:  None.  FINDINGS: Cardiac silhouette is enlarged. There is a large right pleural effusion. There is associated left lower lobe atelectasis. Right lung is relatively clear. Large hiatal hernia is also noted.  IMPRESSION: 1. Large left effusion with associated atelectasis. Findings may warrant a contrast CT thorax for further evaluation as no comparison available. 2. Large hiatal hernia.   Electronically Signed   By: Genevive Bi M.D.   On: 10/20/2013 15:17   Ct Chest W Contrast  10/20/2013   CLINICAL DATA:  Fall 10/17/2013. Anterior chest pain. Left pleural effusion on chest radiograph.  EXAM: CT CHEST WITH CONTRAST  TECHNIQUE: Multidetector CT imaging of the chest was performed during intravenous contrast administration.  CONTRAST:  80mL OMNIPAQUE IOHEXOL 300 MG/ML  SOLN  COMPARISON:  10/20/2013 radiograph.  FINDINGS: There is a moderate size  loculated left pleural effusion present. This demonstrates low attenuation. The fluid appears simple. There is no pleural fluid identified contralateral size. There is a pulmonary nodule in the superior segment of the right lower lobe measuring 14 mm. This raises the possibility of malignant effusion on the left side with metastatic disease to the right lower lobe.  There is collapse/ consolidation of the lingula and left lower lobe. Small mediastinal lymph nodes are present, not pathologically enlarged. There is no pericardial effusion.  There is a large hiatal hernia obtaining  almost all of the stomach which is in the right side of the chest. Dense mitral annular calcification is present. Bilateral calcified breast implants are present. There is no axillary adenopathy. The aorta and branch vessels show atherosclerosis. No acute vascular abnormality. Incidental imaging of the upper abdomen is within normal limits. Elevation of the left hemidiaphragm is present.  Exaggerated thoracic kyphosis with mid thoracic severe spondylosis. Mild wedging is probably chronic and degenerative. No displaced rib fractures are identified. There is no pneumothorax. The clavicles and scapula appear intact bilaterally. No sternal fracture.  IMPRESSION: 1. Moderate loculated left pleural effusion. No cause is identified and the appearance along with a 14 mm superior segment right lower lobe pulmonary nodule raises the possibility of malignant effusion with the nodule in the right upper lobe representing hematogenous metastasis. Thoracentesis should be considered for cytology. Potentially, the superior segment right lower lobe nodule represents primary tumor (bronchogenic carcinoma) with contralateral spread of disease. 2. Large hiatal hernia in the right side of the chest. 3. Atherosclerosis.   Electronically Signed   By: Andreas Newport M.D.   On: 10/20/2013 18:25   US Thoracentesis Asp Pleural Space W/img Guide  10/21/2013    CLINICAL DATA:  Left pleural effusion.  EXAM: ULTRASOUND GUIDED LEFT-SIDED THORACENTESIS  COMPARISON:  None.  FINDINGS: A total of approximately 280 mL of cloudy yellow with internal debris fluid was removed. A fluid sample was sent for laboratory analysis.  IMPRESSION: Successful ultrasound guided left thoracentesis yielding 280 mL of pleural fluid.  PROCEDURE: An ultrasound guided thoracentesis was thoroughly discussed with the patient and questions answered. The benefits, risks, alternatives and complications were also discussed. The patient understood and wished to proceed with the procedure. Written consent was obtained.  Ultrasound was performed to localize and mark an adequate pocket of fluid in the left chest. The area was then prepped and draped in the normal sterile fashion. 1% Lidocaine was used for local anesthesia. Under ultrasound guidance a 7-French Yueh centesis catheter was introduced. Thoracentesis was performed. The catheter was removed and a dressing applied.  Complications:  None   Electronically Signed   By: Trudie Reed M.D.   On: 10/21/2013 16:36    Scheduled Meds: . ciprofloxacin  400 mg Intravenous Q12H  . clonazePAM  0.5 mg Oral BID  . DULoxetine  60 mg Oral Daily  . pantoprazole  40 mg Oral BID  . sodium chloride  3 mL Intravenous Q12H   Continuous Infusions:   Active Problems:   Pleural effusion   Lung mass   UTI (urinary tract infection)  Time spent:  Klohe Lovering K  [facility] Pager [phone]. If 7PM-7AM, please contact night-coverage at [email or web address], password [facility] 10/22/2013, 1:57 PM  LOS: 2 days

## 2013-10-22 NOTE — Progress Notes (Signed)
PT Cancellation Note  Patient Details Name: Rachel Dodson MRN: [id number] DOB: Jun 19, 1946   Cancelled Treatment:    Reason Eval/Treat Not Completed: Patient declined, no reason specified (due prepping for scan) Pt request evaluation to be completed tomorrow.  RUSSELL,CINDY 10/22/2013, 2:11 PM

## 2013-10-23 ENCOUNTER — Inpatient Hospital Stay (HOSPITAL_COMMUNITY): Payer: Medicare Other

## 2013-10-23 ENCOUNTER — Encounter (HOSPITAL_COMMUNITY): Payer: Medicare Other | Admitting: Anesthesiology

## 2013-10-23 ENCOUNTER — Encounter (HOSPITAL_COMMUNITY): Payer: Self-pay | Admitting: Certified Registered"

## 2013-10-23 ENCOUNTER — Inpatient Hospital Stay (HOSPITAL_COMMUNITY): Payer: Medicare Other | Admitting: Anesthesiology

## 2013-10-23 ENCOUNTER — Encounter (HOSPITAL_COMMUNITY)
Admission: EM | Disposition: A | Discharge status: Skilled Nursing Facility | Payer: Self-pay | Source: Home / Self Care | Attending: Pulmonary Disease

## 2013-10-23 ENCOUNTER — Other Ambulatory Visit: Payer: Self-pay | Admitting: Cardiothoracic Surgery

## 2013-10-23 DIAGNOSIS — D72829 Elevated white blood cell count, unspecified: Secondary | ICD-10-CM

## 2013-10-23 DIAGNOSIS — I951 Orthostatic hypotension: Secondary | ICD-10-CM

## 2013-10-23 DIAGNOSIS — I959 Hypotension, unspecified: Secondary | ICD-10-CM

## 2013-10-23 DIAGNOSIS — J9601 Acute respiratory failure with hypoxia: Secondary | ICD-10-CM

## 2013-10-23 DIAGNOSIS — J9600 Acute respiratory failure, unspecified whether with hypoxia or hypercapnia: Secondary | ICD-10-CM

## 2013-10-23 HISTORY — PX: VIDEO BRONCHOSCOPY: SHX5072

## 2013-10-23 HISTORY — PX: VIDEO ASSISTED THORACOSCOPY: SHX5073

## 2013-10-23 LAB — PROTEIN, BODY FLUID

## 2013-10-23 LAB — URINALYSIS, ROUTINE W REFLEX MICROSCOPIC
Bilirubin Urine: NEGATIVE
Glucose, UA: NEGATIVE mg/dL
Hgb urine dipstick: NEGATIVE
Ketones, ur: NEGATIVE mg/dL
Leukocytes, UA: NEGATIVE
Nitrite: NEGATIVE
Protein, ur: NEGATIVE mg/dL
Specific Gravity, Urine: 1.043 — ABNORMAL HIGH (ref 1.005–1.030)
Urobilinogen, UA: 0.2 mg/dL (ref 0.0–1.0)
pH: 5 (ref 5.0–8.0)

## 2013-10-23 LAB — BLOOD GAS, ARTERIAL
Acid-base deficit: 0 mmol/L (ref 0.0–2.0)
Bicarbonate: 24.5 mEq/L — ABNORMAL HIGH (ref 20.0–24.0)
Drawn by: 28701
O2 Content: 4 L/min
O2 Saturation: 90.9 %
Patient temperature: 98.6
TCO2: 25.8 mmol/L (ref 0–100)
pCO2 arterial: 42.5 mmHg (ref 35.0–45.0)
pH, Arterial: 7.379 (ref 7.350–7.450)
pO2, Arterial: 60.6 mmHg — ABNORMAL LOW (ref 80.0–100.0)

## 2013-10-23 LAB — BODY FLUID CELL COUNT WITH DIFFERENTIAL
Neutrophil Count, Fluid: 88 % — ABNORMAL HIGH (ref 0–25)
Total Nucleated Cell Count, Fluid: 4226 cu mm — ABNORMAL HIGH (ref 0–1000)

## 2013-10-23 LAB — GLUCOSE, SEROUS FLUID: Glucose, Fluid: 44 mg/dL

## 2013-10-23 LAB — LACTATE DEHYDROGENASE, PLEURAL OR PERITONEAL FLUID

## 2013-10-23 SURGERY — BRONCHOSCOPY, VIDEO-ASSISTED
Anesthesia: General | Site: Chest | Wound class: Clean Contaminated

## 2013-10-23 MED ORDER — OXYCODONE HCL 5 MG PO TABS: 5.0000 mg | ORAL_TABLET | ORAL | Status: AC | PRN

## 2013-10-23 MED ORDER — ARTIFICIAL TEARS OP OINT
TOPICAL_OINTMENT | OPHTHALMIC | Status: DC | PRN
  Administered 2013-10-23: 1 via OPHTHALMIC

## 2013-10-23 MED ORDER — 0.9 % SODIUM CHLORIDE (POUR BTL) OPTIME
TOPICAL | Status: DC | PRN
  Administered 2013-10-23: 1000 mL

## 2013-10-23 MED ORDER — FENTANYL CITRATE 0.05 MG/ML IJ SOLN: 25.0000 ug | INTRAMUSCULAR | Status: DC | PRN

## 2013-10-23 MED ORDER — IPRATROPIUM BROMIDE 0.02 % IN SOLN
0.5000 mg | Freq: Four times a day (QID) | RESPIRATORY_TRACT | Status: DC
  Administered 2013-10-23 – 2013-10-24 (×3): 0.5 mg via RESPIRATORY_TRACT
  Filled 2013-10-23 (×3): qty 2.5

## 2013-10-23 MED ORDER — PROMETHAZINE HCL 25 MG/ML IJ SOLN: 6.2500 mg | INTRAMUSCULAR | Status: DC | PRN

## 2013-10-23 MED ORDER — ROCURONIUM BROMIDE 100 MG/10ML IV SOLN
INTRAVENOUS | Status: DC | PRN
  Administered 2013-10-23: 20 mg via INTRAVENOUS
  Administered 2013-10-23 (×2): 50 mg via INTRAVENOUS

## 2013-10-23 MED ORDER — DEXTROSE-NACL 5-0.45 % IV SOLN
INTRAVENOUS | Status: DC
  Administered 2013-10-23: 13:00:00 via INTRAVENOUS

## 2013-10-23 MED ORDER — OXYCODONE-ACETAMINOPHEN 5-325 MG PO TABS
1.0000 | ORAL_TABLET | ORAL | Status: DC | PRN
  Administered 2013-10-25 (×3): 1 via ORAL
  Administered 2013-10-26: 2 via ORAL
  Administered 2013-10-26 (×5): 1 via ORAL
  Administered 2013-10-27: 2 via ORAL
  Administered 2013-10-27: 1 via ORAL
  Administered 2013-10-27 (×2): 2 via ORAL
  Administered 2013-10-27: 1 via ORAL
  Administered 2013-10-28 – 2013-10-30 (×13): 2 via ORAL
  Filled 2013-10-23 (×2): qty 1
  Filled 2013-10-23: qty 2
  Filled 2013-10-23: qty 1
  Filled 2013-10-23 (×2): qty 2
  Filled 2013-10-23: qty 1
  Filled 2013-10-23: qty 2
  Filled 2013-10-23 (×3): qty 1
  Filled 2013-10-23 (×5): qty 2
  Filled 2013-10-23: qty 1
  Filled 2013-10-23 (×2): qty 2
  Filled 2013-10-23: qty 1
  Filled 2013-10-23: qty 2
  Filled 2013-10-23 (×2): qty 1
  Filled 2013-10-23 (×5): qty 2

## 2013-10-23 MED ORDER — MIDAZOLAM HCL 2 MG/2ML IJ SOLN: 1.0000 mg | INTRAMUSCULAR | Status: DC | PRN

## 2013-10-23 MED ORDER — GLYCOPYRROLATE 0.2 MG/ML IJ SOLN
INTRAMUSCULAR | Status: DC | PRN
  Administered 2013-10-23: 0.6 mg via INTRAVENOUS
  Administered 2013-10-23: 0.2 mg via INTRAVENOUS

## 2013-10-23 MED ORDER — ALBUTEROL SULFATE (5 MG/ML) 0.5% IN NEBU
2.5000 mg | INHALATION_SOLUTION | Freq: Four times a day (QID) | RESPIRATORY_TRACT | Status: DC
  Administered 2013-10-23 – 2013-10-24 (×3): 2.5 mg via RESPIRATORY_TRACT
  Filled 2013-10-23 (×3): qty 0.5

## 2013-10-23 MED ORDER — BISACODYL 5 MG PO TBEC
10.0000 mg | DELAYED_RELEASE_TABLET | Freq: Every day | ORAL | Status: DC
  Administered 2013-10-24 – 2013-10-30 (×7): 10 mg via ORAL
  Filled 2013-10-23 (×7): qty 2

## 2013-10-23 MED ORDER — ONDANSETRON HCL 4 MG/2ML IJ SOLN
4.0000 mg | Freq: Four times a day (QID) | INTRAMUSCULAR | Status: DC | PRN
  Filled 2013-10-23 (×2): qty 2

## 2013-10-23 MED ORDER — MIDAZOLAM HCL 2 MG/2ML IJ SOLN
1.0000 mg | INTRAMUSCULAR | Status: DC | PRN
  Administered 2013-10-23: 2 mg via INTRAVENOUS

## 2013-10-23 MED ORDER — ACETAMINOPHEN 500 MG PO TABS
1000.0000 mg | ORAL_TABLET | Freq: Four times a day (QID) | ORAL | Status: AC
  Administered 2013-10-23 – 2013-10-24 (×3): 1000 mg via ORAL
  Filled 2013-10-23 (×2): qty 2

## 2013-10-23 MED ORDER — FENTANYL CITRATE 0.05 MG/ML IJ SOLN: 12.5000 ug | INTRAMUSCULAR | Status: DC | PRN

## 2013-10-23 MED ORDER — DIPHENHYDRAMINE HCL 50 MG/ML IJ SOLN: 12.5000 mg | Freq: Four times a day (QID) | INTRAMUSCULAR | Status: DC | PRN

## 2013-10-23 MED ORDER — ONDANSETRON HCL 4 MG/2ML IJ SOLN
INTRAMUSCULAR | Status: DC | PRN
  Administered 2013-10-23: 4 mg via INTRAVENOUS

## 2013-10-23 MED ORDER — NEOSTIGMINE METHYLSULFATE 1 MG/ML IJ SOLN
INTRAMUSCULAR | Status: DC | PRN
  Administered 2013-10-23: 1 mg via INTRAVENOUS
  Administered 2013-10-23: 4 mg via INTRAVENOUS

## 2013-10-23 MED ORDER — FENTANYL 10 MCG/ML IV SOLN
INTRAVENOUS | Status: DC
  Administered 2013-10-23: 17:00:00 via INTRAVENOUS
  Administered 2013-10-23: 100 ug via INTRAVENOUS
  Administered 2013-10-24: 110 ug via INTRAVENOUS
  Administered 2013-10-24: 78 ug via INTRAVENOUS
  Administered 2013-10-24: 40 ug via INTRAVENOUS
  Administered 2013-10-24: 20 ug via INTRAVENOUS
  Administered 2013-10-24: 80 ug via INTRAVENOUS
  Administered 2013-10-25 (×2): 40 ug via INTRAVENOUS
  Administered 2013-10-25: 130 ug via INTRAVENOUS
  Filled 2013-10-23 (×2): qty 50

## 2013-10-23 MED ORDER — FENTANYL CITRATE 0.05 MG/ML IJ SOLN
INTRAMUSCULAR | Status: AC
  Administered 2013-10-23: 50 ug via INTRAVENOUS
  Filled 2013-10-23: qty 2

## 2013-10-23 MED ORDER — ONDANSETRON HCL 4 MG/2ML IJ SOLN
4.0000 mg | Freq: Four times a day (QID) | INTRAMUSCULAR | Status: DC | PRN
  Administered 2013-10-23 – 2013-10-24 (×2): 4 mg via INTRAVENOUS

## 2013-10-23 MED ORDER — PANTOPRAZOLE SODIUM 40 MG IV SOLR
40.0000 mg | INTRAVENOUS | Status: DC
  Administered 2013-10-24: 40 mg via INTRAVENOUS
  Filled 2013-10-23 (×2): qty 40

## 2013-10-23 MED ORDER — SODIUM CHLORIDE 0.9 % IJ SOLN: 9.0000 mL | INTRAMUSCULAR | Status: DC | PRN

## 2013-10-23 MED ORDER — MIDAZOLAM HCL 2 MG/2ML IJ SOLN
INTRAMUSCULAR | Status: AC
  Administered 2013-10-23: 2 mg via INTRAVENOUS
  Filled 2013-10-23: qty 2

## 2013-10-23 MED ORDER — ACETAMINOPHEN 325 MG PO TABS: 650.0000 mg | ORAL_TABLET | Freq: Four times a day (QID) | ORAL | Status: DC | PRN

## 2013-10-23 MED ORDER — LIDOCAINE HCL (CARDIAC) 20 MG/ML IV SOLN
INTRAVENOUS | Status: DC | PRN
  Administered 2013-10-23: 100 mg via INTRAVENOUS

## 2013-10-23 MED ORDER — ACETAMINOPHEN 160 MG/5ML PO SOLN: 1000.0000 mg | Freq: Four times a day (QID) | ORAL | Status: AC

## 2013-10-23 MED ORDER — NALOXONE HCL 0.4 MG/ML IJ SOLN: 0.4000 mg | INTRAMUSCULAR | Status: DC | PRN

## 2013-10-23 MED ORDER — HYDROMORPHONE HCL PF 1 MG/ML IJ SOLN: 0.2500 mg | INTRAMUSCULAR | Status: DC | PRN

## 2013-10-23 MED ORDER — ALBUTEROL SULFATE HFA 108 (90 BASE) MCG/ACT IN AERS
INHALATION_SPRAY | RESPIRATORY_TRACT | Status: DC | PRN
  Administered 2013-10-23: 2 via RESPIRATORY_TRACT

## 2013-10-23 MED ORDER — POTASSIUM CHLORIDE 10 MEQ/50ML IV SOLN
10.0000 meq | Freq: Every day | INTRAVENOUS | Status: DC | PRN
  Administered 2013-10-25: 10 meq via INTRAVENOUS
  Filled 2013-10-23 (×5): qty 50

## 2013-10-23 MED ORDER — FENTANYL CITRATE 0.05 MG/ML IJ SOLN
INTRAMUSCULAR | Status: DC | PRN
  Administered 2013-10-23 (×2): 50 ug via INTRAVENOUS
  Administered 2013-10-23: 100 ug via INTRAVENOUS
  Administered 2013-10-23 (×2): 25 ug via INTRAVENOUS

## 2013-10-23 MED ORDER — LACTATED RINGERS IV SOLN
INTRAVENOUS | Status: DC | PRN
  Administered 2013-10-23 (×3): via INTRAVENOUS

## 2013-10-23 MED ORDER — FENTANYL CITRATE 0.05 MG/ML IJ SOLN: 50.0000 ug | Freq: Once | INTRAMUSCULAR | Status: DC

## 2013-10-23 MED ORDER — SODIUM CHLORIDE 0.9 % IV BOLUS (SEPSIS)
500.0000 mL | Freq: Once | INTRAVENOUS | Status: AC
  Administered 2013-10-23: 500 mL via INTRAVENOUS

## 2013-10-23 MED ORDER — MIDAZOLAM HCL 5 MG/5ML IJ SOLN
INTRAMUSCULAR | Status: DC | PRN
  Administered 2013-10-23: 2 mg via INTRAVENOUS

## 2013-10-23 MED ORDER — PHENYLEPHRINE HCL 10 MG/ML IJ SOLN
10.0000 mg | INTRAVENOUS | Status: DC | PRN
  Administered 2013-10-23: 50 ug/min via INTRAVENOUS

## 2013-10-23 MED ORDER — FENTANYL CITRATE 0.05 MG/ML IJ SOLN
50.0000 ug | INTRAMUSCULAR | Status: DC | PRN
  Administered 2013-10-23: 50 ug via INTRAVENOUS

## 2013-10-23 MED ORDER — OXYCODONE HCL 5 MG/5ML PO SOLN: 5.0000 mg | Freq: Once | ORAL | Status: DC | PRN

## 2013-10-23 MED ORDER — DIPHENHYDRAMINE HCL 12.5 MG/5ML PO ELIX
12.5000 mg | ORAL_SOLUTION | Freq: Four times a day (QID) | ORAL | Status: DC | PRN
  Filled 2013-10-23: qty 5

## 2013-10-23 MED ORDER — OXYCODONE HCL 5 MG PO TABS: 5.0000 mg | ORAL_TABLET | Freq: Once | ORAL | Status: DC | PRN

## 2013-10-23 MED ORDER — PHENYLEPHRINE HCL 10 MG/ML IJ SOLN
INTRAMUSCULAR | Status: DC | PRN
  Administered 2013-10-23 (×2): 120 ug via INTRAVENOUS
  Administered 2013-10-23: 160 ug via INTRAVENOUS

## 2013-10-23 MED ORDER — PROPOFOL 10 MG/ML IV BOLUS
INTRAVENOUS | Status: DC | PRN
  Administered 2013-10-23: 110 mg via INTRAVENOUS
  Administered 2013-10-23: 100 mg via INTRAVENOUS

## 2013-10-23 SURGICAL SUPPLY — 71 items
ADH SKN CLS LQ APL DERMABOND (GAUZE/BANDAGES/DRESSINGS) ×2
APL SRG 22X2 LUM MLBL SLNT (VASCULAR PRODUCTS)
APL SRG 7X2 LUM MLBL SLNT (VASCULAR PRODUCTS)
APPLICATOR TIP COSEAL (VASCULAR PRODUCTS) IMPLANT
APPLICATOR TIP EXT COSEAL (VASCULAR PRODUCTS) IMPLANT
BLADE SURG 11 STRL SS (BLADE) IMPLANT
BRUSH CYTOL CELLEBRITY 1.5X140 (MISCELLANEOUS) IMPLANT
CANISTER SUCTION 2500CC (MISCELLANEOUS) ×3 IMPLANT
CATH KIT ON Q 5IN SLV (PAIN MANAGEMENT) IMPLANT
CATH THORACIC 28FR (CATHETERS) IMPLANT
CATH THORACIC 36FR (CATHETERS) IMPLANT
CATH THORACIC 36FR RT ANG (CATHETERS) IMPLANT
CLIP TI MEDIUM 6 (CLIP) IMPLANT
CONT SPEC 4OZ CLIKSEAL STRL BL (MISCELLANEOUS) ×6 IMPLANT
COVER TABLE BACK 60X90 (DRAPES) ×3 IMPLANT
DERMABOND ADHESIVE PROPEN (GAUZE/BANDAGES/DRESSINGS) ×1
DERMABOND ADVANCED .7 DNX6 (GAUZE/BANDAGES/DRESSINGS) IMPLANT
DRAIN CHANNEL 32F RND 10.7 FF (WOUND CARE) ×2 IMPLANT
DRAPE LAPAROSCOPIC ABDOMINAL (DRAPES) ×3 IMPLANT
DRAPE WARM FLUID 44X44 (DRAPE) ×3 IMPLANT
DRILL BIT 7/64X5 (BIT) IMPLANT
DRSG AQUACEL AG ADV 3.5X14 (GAUZE/BANDAGES/DRESSINGS) ×3 IMPLANT
ELECT BLADE 4.0 EZ CLEAN MEGAD (MISCELLANEOUS) ×3
ELECT REM PT RETURN 9FT ADLT (ELECTROSURGICAL) ×3
ELECTRODE BLDE 4.0 EZ CLN MEGD (MISCELLANEOUS) ×2 IMPLANT
ELECTRODE REM PT RTRN 9FT ADLT (ELECTROSURGICAL) ×2 IMPLANT
FORCEPS BIOP RJ4 1.8 (CUTTING FORCEPS) IMPLANT
GLOVE BIO SURGEON STRL SZ 6.5 (GLOVE) ×6 IMPLANT
GOWN STRL NON-REIN LRG LVL3 (GOWN DISPOSABLE) ×9 IMPLANT
KIT BASIN OR (CUSTOM PROCEDURE TRAY) ×3 IMPLANT
KIT ROOM TURNOVER OR (KITS) ×3 IMPLANT
KIT SUCTION CATH 14FR (SUCTIONS) ×3 IMPLANT
MARKER SKIN DUAL TIP RULER LAB (MISCELLANEOUS) ×3 IMPLANT
NDL BIOPSY TRANSBRONCH 21G (NEEDLE) IMPLANT
NEEDLE BIOPSY TRANSBRONCH 21G (NEEDLE) IMPLANT
NS IRRIG 1000ML POUR BTL (IV SOLUTION) ×6 IMPLANT
OIL SILICONE PENTAX (PARTS (SERVICE/REPAIRS)) ×3 IMPLANT
PACK CHEST (CUSTOM PROCEDURE TRAY) ×3 IMPLANT
PAD ARMBOARD 7.5X6 YLW CONV (MISCELLANEOUS) ×6 IMPLANT
SEALANT PROGEL (MISCELLANEOUS) IMPLANT
SEALANT SURG COSEAL 4ML (VASCULAR PRODUCTS) IMPLANT
SEALANT SURG COSEAL 8ML (VASCULAR PRODUCTS) IMPLANT
SOLUTION ANTI FOG 6CC (MISCELLANEOUS) ×3 IMPLANT
SPONGE GAUZE 4X4 12PLY (GAUZE/BANDAGES/DRESSINGS) ×3 IMPLANT
SUT PROLENE 3 0 SH DA (SUTURE) IMPLANT
SUT PROLENE 4 0 RB 1 (SUTURE)
SUT PROLENE 4-0 RB1 .5 CRCL 36 (SUTURE) IMPLANT
SUT SILK  1 MH (SUTURE) ×4
SUT SILK 1 MH (SUTURE) ×4 IMPLANT
SUT SILK 2 0SH CR/8 30 (SUTURE) IMPLANT
SUT SILK 3 0SH CR/8 30 (SUTURE) IMPLANT
SUT VIC AB 1 CTX 18 (SUTURE) ×1 IMPLANT
SUT VIC AB 1 CTX 36 (SUTURE) ×3
SUT VIC AB 1 CTX36XBRD ANBCTR (SUTURE) IMPLANT
SUT VIC AB 2-0 CT1 27 (SUTURE) ×3
SUT VIC AB 2-0 CT1 TAPERPNT 27 (SUTURE) IMPLANT
SUT VIC AB 2-0 CTX 36 (SUTURE) IMPLANT
SUT VIC AB 3-0 X1 27 (SUTURE) ×1 IMPLANT
SUT VICRYL 2 TP 1 (SUTURE) ×1 IMPLANT
SWAB COLLECTION DEVICE MRSA (MISCELLANEOUS) IMPLANT
SYR 20ML ECCENTRIC (SYRINGE) ×3 IMPLANT
SYSTEM SAHARA CHEST DRAIN ATS (WOUND CARE) ×3 IMPLANT
TAPE CLOTH 4X10 WHT NS (GAUZE/BANDAGES/DRESSINGS) ×3 IMPLANT
TIP APPLICATOR SPRAY EXTEND 16 (VASCULAR PRODUCTS) IMPLANT
TOWEL OR 17X24 6PK STRL BLUE (TOWEL DISPOSABLE) ×3 IMPLANT
TOWEL OR 17X26 10 PK STRL BLUE (TOWEL DISPOSABLE) ×6 IMPLANT
TRAP SPECIMEN MUCOUS 40CC (MISCELLANEOUS) ×4 IMPLANT
TRAY FOLEY CATH 14FRSI W/METER (CATHETERS) ×3 IMPLANT
TUBE ANAEROBIC SPECIMEN COL (MISCELLANEOUS) IMPLANT
TUBE CONNECTING 12X1/4 (SUCTIONS) ×6 IMPLANT
WATER STERILE IRR 1000ML POUR (IV SOLUTION) ×6 IMPLANT

## 2013-10-23 NOTE — Progress Notes (Signed)
Physical Therapy Discharge Patient Details Name: Rachel Dodson MRN: [id number] DOB: [birth year] Today's Date: 10/23/2013 Time:  -     Patient discharged from PT services secondary to surgery - will need to re-order PT to resume therapy services.  ** PT eval order placed at [facility] prior to transfer to [facility].    Progress and discharge plan discussed with patient and/or caregiver: pt in OR  GP     [name] 10/23/2013, 9:56 AM  Pager [phone]

## 2013-10-23 NOTE — Procedures (Signed)
Extubation Procedure Note  Patient Details:   Name: Rachel Dodson DOB: May 12, 1946 MRN: [id number]   Airway Documentation:     Evaluation  O2 sats: stable throughout Complications: No apparent complications Patient did tolerate procedure well. Bilateral Breath Sounds: Clear;Diminished   Yes  Pt tolerated wean, positive for cuff leak, extubated to 5lpm [place]. No dyspnea or stridor noted after extubation. Pt resting more comfortably and all vitals are within normal limits. RT Will continue to monitor.   Beatris Si 10/23/2013, 4:09 PM

## 2013-10-23 NOTE — Anesthesia Postprocedure Evaluation (Signed)
  Anesthesia Post-op Note  Patient: Rachel Dodson  Procedure(s) Performed: Procedure(s): VIDEO BRONCHOSCOPY (N/A) VIDEO ASSISTED THORACOSCOPY (Left)  Patient Location: ICU  Anesthesia Type:General  Level of Consciousness: Patient remains intubated per anesthesia plan  Airway and Oxygen Therapy: Patient remains intubated per anesthesia plan and Patient placed on Ventilator (see vital sign flow sheet for setting)  Post-op Pain: none  Post-op Assessment: Post-op Vital signs reviewed, Patient's Cardiovascular Status Stable, Respiratory Function Stable, Patent Airway, No signs of Nausea or vomiting and Pain level controlled  Post-op Vital Signs: Reviewed and stable  Complications: No apparent anesthesia complications

## 2013-10-23 NOTE — Progress Notes (Signed)
Late Entry 10/30 1800 - Patient being transferred to [facility].  Report called and given to [facility], RN on receiving unit.  Question if patient would be more appropriate for ICU instead of stepdown d/t recent VS and breathing issues.  Notified MD who spoke with Critical Care team at [facility] - still plan for transfer to 3300. MD also notified of low BP and high Pulse.  Orders given for 500 cc bolus and then NS @ 100cc/hr.  Bolus still infusing when [facility] arrived to transport patient at approximately 1900.  3300 charge nurse notified of plan for patient to still come to their unit.  Patient alert and oriented, gives consent to travel with [facility] and explained transport reasons.

## 2013-10-23 NOTE — Anesthesia Preprocedure Evaluation (Addendum)
Anesthesia Evaluation  Patient identified by MRN, date of birth, ID band  Reviewed: Allergy & Precautions, H&P , NPO status , Patient's Chart, lab work & pertinent test results  Airway Mallampati: I TM Distance: >3 FB Neck ROM: Full    Dental  (+) Teeth Intact, Chipped and Poor Dentition,    Pulmonary shortness of breath and at rest, former smoker,  Lung mass Pl effusions + rhonchi         Cardiovascular hypertension, Pt. on medications Rhythm:Regular Rate:Tachycardia     Neuro/Psych Anxiety Depression    GI/Hepatic   Endo/Other    Renal/GU      Musculoskeletal   Abdominal   Peds  Hematology   Anesthesia Other Findings SaO2 60  Reproductive/Obstetrics                          Anesthesia Physical Anesthesia Plan  ASA: III  Anesthesia Plan: General   Post-op Pain Management:    Induction: Intravenous  Airway Management Planned: Double Lumen EBT  Additional Equipment: Arterial line and CVP  Intra-op Plan:   Post-operative Plan: Extubation in OR  Informed Consent: I have reviewed the patients History and Physical, chart, labs and discussed the procedure including the risks, benefits and alternatives for the proposed anesthesia with the patient or authorized representative who has indicated his/her understanding and acceptance.     Plan Discussed with: CRNA and Surgeon  Anesthesia Plan Comments:         Anesthesia Quick Evaluation

## 2013-10-23 NOTE — Progress Notes (Signed)
Patient c/o 9/10 lower back pain. BP 88/51, HR 111. L. [place] notified. New orders received. Dr. Herma Carson notified in the box of patient's arrival to the unit. Will continue to monitor.   Rochele Pages, RN

## 2013-10-23 NOTE — Progress Notes (Signed)
[facility] PROGRESS NOTE  Rachel Dodson WUJ:[id number] DOB: [birth year] DOA: 10/20/2013 PCP: Cassell Smiles., MD  Assessment/Plan: Pleural effusion  1. IR consulted for diagnostic/therapeutic thoracentesis - s/p 280cc on 10/29 2. Fluid analysis suggestive of exudate 3. Culture and cytology pending 4. Patient was transferred to [facility] for bronch with VATS for drainage- appreciate Dr. Tyrone Sage   AKI -monitor- Check BMP in AM  Lung mass  1. Fluid to be sent for cytology - pending 2. 14mm RLL nodule on CT 3. Pending results  UTI with sepsis 1. WBC 2. IVF hydration 3. Will continue on IV cipro given worsening WBC 4. Blood cx pending 5. Urine cx with multidrug sensitive ecoli  HTN  1. BP stable  Hx tobacco abuse  1. Quit 4 months ago  DVT prophylaxis  1. SCD's  Code Status: Full Family Communication: Pt in room  Disposition Plan: Pending  Procedures:  Thoracentesis 10/21/13  Antibiotics:  Levaquin 10/20/13>>>10/21/13  Ciprofloxacin 10/21/13>>>  HPI/Subjective: Going to VATS procedure  Objective: Filed Vitals:   10/23/13 0400 10/23/13 0500 10/23/13 0600 10/23/13 0700  BP: 99/59 105/62 109/59 96/60  Pulse: 109 108 106 103  Temp: 97.7 F (36.5 C)     TempSrc: Oral     Resp: 18 24 20 15   Height:      Weight:      SpO2: 94% 94% 98% 97%    Intake/Output Summary (Last 24 hours) at 10/23/13 0809 Last data filed at 10/23/13 0700  Gross per 24 hour  Intake   2000 ml  Output    250 ml  Net   1750 ml   Filed Weights   10/20/13 2147 10/22/13 2152  Weight: 63.4 kg (139 lb 12.4 oz) 64.3 kg (141 lb 12.1 oz)    Exam:   General:  Awake, in nad  Cardiovascular: regular, s1, s2  Respiratory: normal resp effort, no wheezing  Abdomen: soft, nondistended  Musculoskeletal: perfused, no clubbing   Data Reviewed: Basic Metabolic Panel:  Recent Labs Lab 10/20/13 1319 10/21/13 0543 10/22/13 2210  NA 131* 130* 130*  K 3.4* 3.5 3.5  CL 92*  91* 92*  CO2 27 27 25   GLUCOSE 141* 130* 99  BUN 21 22 35*  CREATININE 1.01 0.98 1.44*  CALCIUM 9.7 9.0 7.9*   Liver Function Tests:  Recent Labs Lab 10/21/13 0543 10/22/13 2210  AST 9 12  ALT 11 10  ALKPHOS 66 75  BILITOT 0.4 0.3  PROT 6.4 5.8*  ALBUMIN 3.0* 2.4*   No results found for this basename: LIPASE, AMYLASE,  in the last 168 hours No results found for this basename: AMMONIA,  in the last 168 hours CBC:  Recent Labs Lab 10/20/13 1319 10/21/13 0543 10/22/13 1249 10/22/13 2210  WBC 23.5* 29.1* 31.0* 26.9*  NEUTROABS 20.7*  --  27.6*  --   HGB 14.6 14.7 14.0 13.1  HCT 42.2 42.4 40.9 38.0  MCV 93.0 93.2 93.6 93.4  PLT 302 281 297 259   Cardiac Enzymes:  Recent Labs Lab 10/20/13 1319 10/20/13 2331 10/21/13 0543 10/21/13 1123  TROPONINI <0.30 <0.30 <0.30 <0.30   BNP (last 3 results)  Recent Labs  10/21/13 1123  PROBNP 642.7*   CBG:  Recent Labs Lab 10/22/13 2115  GLUCAP 97    Recent Results (from the past 240 hour(s))  URINE CULTURE     Status: None   Collection Time    10/20/13  3:24 PM      Result Value Range Status  Specimen Description URINE, CLEAN CATCH   Final   Special Requests NONE   Final   Culture  Setup Time     Final   Value: 10/20/2013 17:23     Performed at [facility] Count     Final   Value: 75,000 COLONIES/ML     Performed at [facility]   Culture     Final   Value: ESCHERICHIA COLI     Performed at [facility]   Report Status 10/21/2013 FINAL   Final   Organism ID, Bacteria ESCHERICHIA COLI   Final  BODY FLUID CULTURE     Status: None   Collection Time    10/21/13  3:45 PM      Result Value Range Status   Specimen Description FLUID LEFT PLEURAL   Final   Special Requests NONE   Final   Gram Stain     Final   Value: WBC PRESENT, PREDOMINANTLY PMN     NO ORGANISMS SEEN     CYTOSPIN     Performed at [facility]   Culture PENDING   Incomplete   Report Status  PENDING   Incomplete  MRSA PCR SCREENING     Status: None   Collection Time    10/22/13  8:41 PM      Result Value Range Status   MRSA by PCR NEGATIVE  NEGATIVE Final   Comment:            The GeneXpert MRSA Assay (FDA     approved for NASAL specimens     only), is one component of a     comprehensive MRSA colonization     surveillance program. It is not     intended to diagnose MRSA     infection nor to guide or     monitor treatment for     MRSA infections.     Studies: Dg Chest 1 View  10/21/2013   CLINICAL DATA:  Pleural effusion status post thoracentesis.  EXAM: CHEST - 1 VIEW  COMPARISON:  CHEST x-ray 08/03/2027 2014.  FINDINGS: Lung volumes are low on this expiratory film. Previously noted large left pleural effusion has increased, despite the interval thoracentesis. The majority of the left lung appears completely collapsed or consolidated, with exception of the apex of the left upper lobe. No definite pneumothorax. Right lung appears clear. Cardiac silhouette is largely obscured, but the heart side is heart size is borderline enlarged. Massive hiatal hernia occupying much of the lower medial right hemithorax. Upper mediastinal contours are distorted by patient positioning. Atherosclerosis in the thoracic aorta.  IMPRESSION: 1. No definite postprocedural pneumothorax identified. 2. Enlarging left pleural effusion despite recent thoracentesis. 3. Large hiatal hernia again noted. 4. Atherosclerosis.   Electronically Signed   By: Trudie Reed M.D.   On: 10/21/2013 16:16   Ct Head W Wo Contrast  10/22/2013   CLINICAL DATA:  Weakness. Abnormal chest x-ray. Fell in shower 5 days ago. Metastatic survey. Hypertension.  EXAM: CT HEAD WITHOUT AND WITH CONTRAST  TECHNIQUE: Contiguous axial images were obtained from the base of the skull through the vertex without and with intravenous contrast  CONTRAST:  50mL OMNIPAQUE IOHEXOL 300 MG/ML  SOLN  COMPARISON:  None.  FINDINGS: Exam is motion  degraded.  No skull fracture or intracranial hemorrhage.  No CT evidence of large acute infarct.  No intracranial enhancing lesion.  Tiny lucencies calvarium may be normal. Small osseous metastatic lesions not  entirely excluded.  No hydrocephalus.  IMPRESSION: Exam is motion degraded.  No skull fracture or intracranial hemorrhage.  No CT evidence of large acute infarct.  No intracranial enhancing lesion.  Tiny lucencies calvarium may be normal. Small osseous metastatic lesions not entirely excluded.   Electronically Signed   By: Bridgett Larsson M.D.   On: 10/22/2013 18:41   Ct Abdomen Pelvis W Contrast  10/22/2013   CLINICAL DATA:  Weakness, shortness of breath, back pain, dizziness, chest pain, smoker, fell in shower 5 days ago, abnormal chest radiograph  EXAM: CT ABDOMEN AND PELVIS WITH CONTRAST  TECHNIQUE: Multidetector CT imaging of the abdomen and pelvis was performed using the standard protocol following bolus administration of intravenous contrast. Sagittal and coronal MPR images reconstructed from axial data set.  CONTRAST:  50mL OMNIPAQUE IOHEXOL 300 MG/ML SOLN, OMNIPAQUE IOHEXOL 300 MG/ML SOLN  COMPARISON:  10/20/2013 CT chest  FINDINGS: Bilateral breast prostheses with partial capsular calcification.  Large hiatal hernia.  Small pericardial effusion with moderate sized left pleural effusion and left lower lobe atelectasis.  Minimal nonspecific pleural enhancement is identified at the left lung base similar to prior CT chest, can be seen with reactive processes, infection and tumor.  Dependent density within gallbladder question gallstones versus vicarious excretion of contrast.  Scattered respiratory motion artifacts.  Liver, spleen, pancreas, kidneys, and adrenal glands grossly normal for degree of motion.  Stomach and bowel loops normal appearance.  Excreted material within urinary bladder.  Mild scattered atherosclerotic calcifications.  Stomach and bowel loops normal appearance.  No mass,  adenopathy, ascites, or free air.  No definite fractures.  Scattered degenerative disc disease changes of the thoracic and lumbar spine.  IMPRESSION: No acute intra-abdominal or intrapelvic abnormalities.  Small pericardial effusion.  Moderate left pleural effusion with enhancing pleural margins as discussed above.  Bibasilar atelectasis.  Large hiatal hernia.   Electronically Signed   By: Ulyses Southward M.D.   On: 10/22/2013 17:57   US Thoracentesis Asp Pleural Space W/img Guide  10/21/2013   CLINICAL DATA:  Left pleural effusion.  EXAM: ULTRASOUND GUIDED LEFT-SIDED THORACENTESIS  COMPARISON:  None.  FINDINGS: A total of approximately 280 mL of cloudy yellow with internal debris fluid was removed. A fluid sample was sent for laboratory analysis.  IMPRESSION: Successful ultrasound guided left thoracentesis yielding 280 mL of pleural fluid.  PROCEDURE: An ultrasound guided thoracentesis was thoroughly discussed with the patient and questions answered. The benefits, risks, alternatives and complications were also discussed. The patient understood and wished to proceed with the procedure. Written consent was obtained.  Ultrasound was performed to localize and mark an adequate pocket of fluid in the left chest. The area was then prepped and draped in the normal sterile fashion. 1% Lidocaine was used for local anesthesia. Under ultrasound guidance a 7-French Yueh centesis catheter was introduced. Thoracentesis was performed. The catheter was removed and a dressing applied.  Complications:  None   Electronically Signed   By: Trudie Reed M.D.   On: 10/21/2013 16:36    Scheduled Meds: . cefUROXime (ZINACEF)  IV  1.5 g Intravenous 60 min Pre-Op  . Chlorhexidine Gluconate Cloth  6 each Topical Q0600  . ciprofloxacin  400 mg Intravenous Q12H  . clonazePAM  0.5 mg Oral BID  . DULoxetine  60 mg Oral Daily  . pantoprazole  40 mg Oral BID  . sodium chloride  3 mL Intravenous Q12H   Continuous Infusions: . sodium  chloride 100 mL/hr at 10/22/13 1845  Active Problems:   Pleural effusion   Lung mass   UTI (urinary tract infection)   Sepsis  Time spent:  Marlin Canary  [facility] Pager [phone]. If 7PM-7AM, please contact night-coverage at [email or web address], password [facility] 10/23/2013, 8:09 AM  LOS: 3 days

## 2013-10-23 NOTE — Anesthesia Procedure Notes (Addendum)
Procedure Name: Intubation Date/Time: 10/23/2013 9:47 AM Performed by: De Nurse Pre-anesthesia Checklist: Patient identified, Emergency Drugs available, Suction available, Patient being monitored and Timeout performed Patient Re-evaluated:Patient Re-evaluated prior to inductionOxygen Delivery Method: Circle system utilized Preoxygenation: Pre-oxygenation with 100% oxygen Intubation Type: IV induction Ventilation: Mask ventilation without difficulty Laryngoscope Size: Mac and 3 Grade View: Grade I Tube type: Oral Tube size: 8.5 mm Number of attempts: 1 Airway Equipment and Method: Stylet Placement Confirmation: ETT inserted through vocal cords under direct vision,  positive ETCO2 and breath sounds checked- equal and bilateral Secured at: 22 cm Tube secured with: Tape Dental Injury: Teeth and Oropharynx as per pre-operative assessment     Procedure Name: Intubation Date/Time: 10/23/2013 10:02 AM Performed by: Jefm Miles E Laryngoscope Size: Mac and 3 Grade View: Grade I Tube type: Oral Endobronchial tube: Left and Double lumen EBT and 37 Fr Number of attempts: 1 Airway Equipment and Method: Stylet and Fiberoptic brochoscope Placement Confirmation: ETT inserted through vocal cords under direct vision,  positive ETCO2 and breath sounds checked- equal and bilateral Secured at: 28 cm Tube secured with: Tape Dental Injury: Injury to lip and Teeth and Oropharynx as per pre-operative assessment     Procedure Name: Intubation Date/Time: 10/23/2013 12:31 PM Performed by: Jefm Miles E Intubation Type: IV induction Laryngoscope Size: Mac and 3 Grade View: Grade I Tube type: Oral Tube size: 8.0 mm Number of attempts: 1 Airway Equipment and Method: Stylet Placement Confirmation: ETT inserted through vocal cords under direct vision,  positive ETCO2 and breath sounds checked- equal and bilateral Secured at: 22 cm Tube secured with: Tape Dental Injury: Teeth and  Oropharynx as per pre-operative assessment

## 2013-10-23 NOTE — Preoperative (Signed)
Beta Blockers   Reason not to administer Beta Blockers:Not Applicable 

## 2013-10-23 NOTE — Progress Notes (Signed)
PM check:  RASS 0. + F/C Comfortable on PS 5 cm H2O Able to double spont Vt  Orders for extubation entered  Billy Fischer, MD ; [facility] service Mobile [phone].  After 5:30 PM or weekends, call [phone]

## 2013-10-23 NOTE — Progress Notes (Signed)
ABG Results after OR (results delayed in uploading to [facility])  PH-7.32 CO2-46.1 PO2-72 HCO3-24 SO2-93%

## 2013-10-23 NOTE — Consult Note (Signed)
PULMONARY  / CRITICAL CARE MEDICINE  Name: Rachel Dodson MRN: [id number] DOB: [birth year]    ADMISSION DATE:  10/20/2013 CONSULTATION DATE:  10/23/13   REFERRING MD :  Dr. Tyrone Sage PRIMARY SERVICE: [facility]-->PCCM  CHIEF COMPLAINT:  Post-Op Resp Fx  BRIEF PATIENT DESCRIPTION: 67 y/o F admitted on 10/28 with 3d hx of SOB, back pain & dizziness after a fall.  CT Chest found moderate loculate L pleural effusion & pulmonary nodule.  Underwent VATS drainage 10/31.  PCCM consulted for post-op respiratory failure.     SIGNIFICANT EVENTS / STUDIES:  10/28 - Admit post mechanical fall w/ SOB, back pain.  Loculated effusion on CT + pulm nodule 10/31 - VATS with drainage of empyema / decortication   LINES / TUBES: 10-31 OTT>> 10-31 rt [place] cvl>> 10-31 lt rad aline>> 10-31 lt c t x 2>>  CULTURES: 10-30 pleural fluid>> 10-28 uc>>ecoli ss cipro 10-30 bc x 2>> ANTIBIOTICS: 10-31 cipro>>  HISTORY OF PRESENT ILLNESS:    67 y/o F admitted on 10/28 with 3d hx of SOB, back pain & dizziness after a fall.  CT Chest found moderate loculate L pleural effusion & pulmonary nodule.  Underwent VATS drainage 10/31.  PCCM consulted for post-op respiratory failure.      PAST MEDICAL HISTORY :  Past Medical History  Diagnosis Date  . Hypertension   . Depression   . Anxiety    Past Surgical History  Procedure Laterality Date  . Abdominal hysterectomy     Prior to Admission medications   Medication Sig Start Date End Date Taking? Authorizing Provider  ALPRAZolam Prudy Feeler) 0.5 MG tablet Take 0.5 mg by mouth at bedtime as needed for sleep.   Yes Historical Provider, MD  amLODipine (NORVASC) 5 MG tablet Take 5 mg by mouth every other day.    Yes Historical Provider, MD  bisoprolol-hydrochlorothiazide (ZIAC) 5-6.25 MG per tablet Take 1 tablet by mouth 2 (two) times daily. 09/29/13  Yes Historical Provider, MD  clonazePAM (KLONOPIN) 0.5 MG tablet Take 0.5 mg by mouth 2 (two) times daily. 09/29/13  Yes Historical  Provider, MD  DULoxetine (CYMBALTA) 60 MG capsule Take 60 mg by mouth daily. 09/29/13  Yes Historical Provider, MD  losartan (COZAAR) 50 MG tablet Take 50 mg by mouth daily. 09/29/13  Yes Historical Provider, MD  oxyCODONE (ROXICODONE) 15 MG immediate release tablet Take 15 mg by mouth 3 (three) times daily. 09/02/13  Yes Historical Provider, MD  pantoprazole (PROTONIX) 40 MG tablet Take 40 mg by mouth 2 (two) times daily. 09/02/13  Yes Historical Provider, MD  ciprofloxacin (CIPRO) 500 MG tablet Take 1 tablet (500 mg total) by mouth 2 (two) times daily. 10/22/13   Jerald Kief, MD  ondansetron (ZOFRAN) 4 MG tablet Take 1 tablet (4 mg total) by mouth every 8 (eight) hours as needed for nausea. 10/22/13   Jerald Kief, MD   Allergies  Allergen Reactions  . Latex     FAMILY HISTORY:  No family history on file. SOCIAL HISTORY:  reports that she has quit smoking. Her smoking use included Cigarettes. She smoked 0.00 packs per day. She does not have any smokeless tobacco history on file. She reports that she does not drink alcohol or use illicit drugs.  REVIEW OF SYSTEMS:  NA  SUBJECTIVE:   VITAL SIGNS: Temp:  [97.7 F (36.5 C)-98.7 F (37.1 C)] 98.5 F (36.9 C) (10/31 0700) Pulse Rate:  [47-114] 103 (10/31 0915) Resp:  [15-30] 16 (10/31 0915) BP: (84-110)/(45-66)  99/46 mmHg (10/31 0915) SpO2:  [87 %-100 %] 100 % (10/31 0915) FiO2 (%):  [35 %-50 %] 50 % (10/31 0215) Weight:  [141 lb 12.1 oz (64.3 kg)] 141 lb 12.1 oz (64.3 kg) (10/30 2152) HEMODYNAMICS:   VENTILATOR SETTINGS: Vent Mode:  [-]  FiO2 (%):  [35 %-50 %] 50 % INTAKE / OUTPUT: Intake/Output     10/30 0701 - 10/31 0700 10/31 0701 - 11/01 0700   P.O.     I.V. (mL/kg) 800 (12.4) 1000 (15.6)   IV Piggyback 1200    Total Intake(mL/kg) 2000 (31.1) 1000 (15.6)   Urine (mL/kg/hr) 250 (0.2) 325 (0.9)   Blood  150 (0.4)   Total Output 250 475   Net +1750 +525        Urine Occurrence 1 x    Stool Occurrence 1 x       PHYSICAL EXAMINATION: General: wnwdwf sedated on vent Neuro:  Recent nmb prevents adequate neuro exam HEENT:  No JVD. No LAN Cardiovascular:  HSR RRR no mumur Lungs:  diminished left, ct x 2 from left, no air leak, rt lung clear Abdomen:  +bs, soft Musculoskeletal: intact Skin:  Cool , no edema  LABS:  CBC Recent Labs     10/21/13  0543  10/22/13  1249  10/22/13  2210  WBC  29.1*  31.0*  26.9*  HGB  14.7  14.0  13.1  HCT  42.4  40.9  38.0  PLT  281  297  259   Coag's Recent Labs     10/22/13  2210  APTT  34  INR  1.36   BMET Recent Labs     10/20/13  1319  10/21/13  0543  10/22/13  2210  NA  131*  130*  130*  K  3.4*  3.5  3.5  CL  92*  91*  92*  CO2  27  27  25   BUN  21  22  35*  CREATININE  1.01  0.98  1.44*  GLUCOSE  141*  130*  99   Electrolytes Recent Labs     10/20/13  1319  10/21/13  0543  10/22/13  2210  CALCIUM  9.7  9.0  7.9*   Sepsis Markers No results found for this basename: LACTICACIDVEN, PROCALCITON, O2SATVEN,  in the last 72 hours ABG Recent Labs     10/23/13  0105  PHART  7.379  PCO2ART  42.5  PO2ART  60.6*   Liver Enzymes Recent Labs     10/21/13  0543  10/22/13  2210  AST  9  12  ALT  11  10  ALKPHOS  66  75  BILITOT  0.4  0.3  ALBUMIN  3.0*  2.4*   Cardiac Enzymes Recent Labs     10/20/13  2331  10/21/13  0543  10/21/13  1123  TROPONINI  <0.30  <0.30  <0.30  PROBNP   --    --   642.7*   Glucose Recent Labs     10/22/13  2115  GLUCAP  97    Imaging Dg Chest 1 View  10/21/2013   CLINICAL DATA:  Pleural effusion status post thoracentesis.  EXAM: CHEST - 1 VIEW  COMPARISON:  CHEST x-ray 08/03/2027 2014.  FINDINGS: Lung volumes are low on this expiratory film. Previously noted large left pleural effusion has increased, despite the interval thoracentesis. The majority of the left lung appears completely collapsed or consolidated, with exception of the apex of the  left upper lobe. No definite pneumothorax.  Right lung appears clear. Cardiac silhouette is largely obscured, but the heart side is heart size is borderline enlarged. Massive hiatal hernia occupying much of the lower medial right hemithorax. Upper mediastinal contours are distorted by patient positioning. Atherosclerosis in the thoracic aorta.  IMPRESSION: 1. No definite postprocedural pneumothorax identified. 2. Enlarging left pleural effusion despite recent thoracentesis. 3. Large hiatal hernia again noted. 4. Atherosclerosis.   Electronically Signed   By: Trudie Reed M.D.   On: 10/21/2013 16:16   Ct Head W Wo Contrast  10/22/2013   CLINICAL DATA:  Weakness. Abnormal chest x-ray. Fell in shower 5 days ago. Metastatic survey. Hypertension.  EXAM: CT HEAD WITHOUT AND WITH CONTRAST  TECHNIQUE: Contiguous axial images were obtained from the base of the skull through the vertex without and with intravenous contrast  CONTRAST:  50mL OMNIPAQUE IOHEXOL 300 MG/ML  SOLN  COMPARISON:  None.  FINDINGS: Exam is motion degraded.  No skull fracture or intracranial hemorrhage.  No CT evidence of large acute infarct.  No intracranial enhancing lesion.  Tiny lucencies calvarium may be normal. Small osseous metastatic lesions not entirely excluded.  No hydrocephalus.  IMPRESSION: Exam is motion degraded.  No skull fracture or intracranial hemorrhage.  No CT evidence of large acute infarct.  No intracranial enhancing lesion.  Tiny lucencies calvarium may be normal. Small osseous metastatic lesions not entirely excluded.   Electronically Signed   By: Bridgett Larsson M.D.   On: 10/22/2013 18:41   Ct Abdomen Pelvis W Contrast  10/22/2013   CLINICAL DATA:  Weakness, shortness of breath, back pain, dizziness, chest pain, smoker, fell in shower 5 days ago, abnormal chest radiograph  EXAM: CT ABDOMEN AND PELVIS WITH CONTRAST  TECHNIQUE: Multidetector CT imaging of the abdomen and pelvis was performed using the standard protocol following bolus administration of intravenous  contrast. Sagittal and coronal MPR images reconstructed from axial data set.  CONTRAST:  50mL OMNIPAQUE IOHEXOL 300 MG/ML SOLN, OMNIPAQUE IOHEXOL 300 MG/ML SOLN  COMPARISON:  10/20/2013 CT chest  FINDINGS: Bilateral breast prostheses with partial capsular calcification.  Large hiatal hernia.  Small pericardial effusion with moderate sized left pleural effusion and left lower lobe atelectasis.  Minimal nonspecific pleural enhancement is identified at the left lung base similar to prior CT chest, can be seen with reactive processes, infection and tumor.  Dependent density within gallbladder question gallstones versus vicarious excretion of contrast.  Scattered respiratory motion artifacts.  Liver, spleen, pancreas, kidneys, and adrenal glands grossly normal for degree of motion.  Stomach and bowel loops normal appearance.  Excreted material within urinary bladder.  Mild scattered atherosclerotic calcifications.  Stomach and bowel loops normal appearance.  No mass, adenopathy, ascites, or free air.  No definite fractures.  Scattered degenerative disc disease changes of the thoracic and lumbar spine.  IMPRESSION: No acute intra-abdominal or intrapelvic abnormalities.  Small pericardial effusion.  Moderate left pleural effusion with enhancing pleural margins as discussed above.  Bibasilar atelectasis.  Large hiatal hernia.   Electronically Signed   By: Ulyses Southward M.D.   On: 10/22/2013 17:57   US Thoracentesis Asp Pleural Space W/img Guide  10/21/2013   CLINICAL DATA:  Left pleural effusion.  EXAM: ULTRASOUND GUIDED LEFT-SIDED THORACENTESIS  COMPARISON:  None.  FINDINGS: A total of approximately 280 mL of cloudy yellow with internal debris fluid was removed. A fluid sample was sent for laboratory analysis.  IMPRESSION: Successful ultrasound guided left thoracentesis yielding 280 mL of  pleural fluid.  PROCEDURE: An ultrasound guided thoracentesis was thoroughly discussed with the patient and questions answered.  The benefits, risks, alternatives and complications were also discussed. The patient understood and wished to proceed with the procedure. Written consent was obtained.  Ultrasound was performed to localize and mark an adequate pocket of fluid in the left chest. The area was then prepped and draped in the normal sterile fashion. 1% Lidocaine was used for local anesthesia. Under ultrasound guidance a 7-French Yueh centesis catheter was introduced. Thoracentesis was performed. The catheter was removed and a dressing applied.  Complications:  None   Electronically Signed   By: Trudie Reed M.D.   On: 10/21/2013 16:36     CXR: see above  ASSESSMENT / PLAN:  PULMONARY A:VDRF post vats 10-31    Vats for empyema  P:   See vent orders Wean as tolerated Extubation per CVTS guidelines, suspect vent support overnight  CARDIOVASCULAR A: Hypotension/sepsis from presumed empyema       CHF P:  Fluids as needed CVP ABX as needed Pressors as needed Check procalcitonin   RENAL Lab Results  Component Value Date   CREATININE 1.44* 10/22/2013   CREATININE 0.98 10/21/2013   CREATININE 1.01 10/20/2013    A: Mild renal insuff base .9 P:   Fluids and monitor renal function  GASTROINTESTINAL A:  GI protection P:   PPI  HEMATOLOGIC A:  No acute issue P:  Monitor cbc post vats  INFECTIOUS A:  Post VATS for empyema P:   See flows for abx/ cultures  ENDOCRINE A:  No acute issue P:     NEUROLOGIC A: Sedated on vent but reponsive P:   No acute issue  Summary: 10-31 admitted to SICU post vats for empyema. Remains on vent PCCM asked to consult. Will wean as able and to CVTS guidelines,  Brett Canales Minor ACNP [facility] PCCM Pager [phone] till 3 pm If no answer page [phone] 10/23/2013, 1:21 PM  CC time 40 min.  Patient seen and examined, agree with above note.  I dictated the care and orders written for this patient under my direction.  Alyson Reedy, MD [phone]

## 2013-10-23 NOTE — Brief Op Note (Signed)
10/20/2013 - 10/23/2013  11:48 AM  PATIENT:  Rachel Dodson  67 y.o. female  PRE-OPERATIVE DIAGNOSIS:  Left pleural effusion  POST-OPERATIVE DIAGNOSIS:  Left pleural effusion  PROCEDURE:  Procedure(s):  VIDEO BRONCHOSCOPY (N/A)  VIDEO ASSISTED THORACOSCOPY/MINI THORACOTOMY -Drainage of Empyema -Decortication  SURGEON:  Surgeon(s) and Role:    * Delight Ovens, MD - Primary  PHYSICIAN ASSISTANT: Basilio Meadow PA-C  ANESTHESIA:   general  EBL:  Total I/O In: 1000 [I.V.:1000] Out: 475 [Urine:325; Blood:150]  BLOOD ADMINISTERED:none  DRAINS: Left sided chest tube  x2   LOCAL MEDICATIONS USED:  NONE  SPECIMEN:  Source of Specimen:  Left Pleural Peel, Left Pleural fluid  DISPOSITION OF SPECIMEN:  Pathology, Cytology, Microbiology  COUNTS:  YES  TOURNIQUET:  * No tourniquets in log *  DICTATION: .Dragon Dictation  PLAN OF CARE: Admit to inpatient   PATIENT DISPOSITION:  PACU - hemodynamically stable.   Delay start of Pharmacological VTE agent (>24hrs) due to surgical blood loss or risk of bleeding: yes

## 2013-10-23 NOTE — Transfer of Care (Signed)
Immediate Anesthesia Transfer of Care Note  Patient: Rachel Dodson  Procedure(s) Performed: Procedure(s): VIDEO BRONCHOSCOPY (N/A) VIDEO ASSISTED THORACOSCOPY (Left)  Patient Location: SICU  Anesthesia Type:General  Level of Consciousness: Patient remains intubated per anesthesia plan  Airway & Oxygen Therapy: Patient remains intubated per anesthesia plan and Patient placed on Ventilator (see vital sign flow sheet for setting)  Post-op Assessment: Report given to PACU RN  Post vital signs: Reviewed and stable  Complications: No apparent anesthesia complications

## 2013-10-23 NOTE — Progress Notes (Signed)
Occupational Therapy Discharge Note  Chart reviewed.  Note that OT ordered at [facility] prior to transfer to [facility]. Pt now undergoing VATS.   Will discharge OT at this time, and please re-order when pt medically stable.   Ursula Alert [place], [place]  [phone]

## 2013-10-23 NOTE — Progress Notes (Signed)
Patient still c/o 9/10 lower back pain. BP 96/50, HR 112. Pain medicine given. BP dropped to 84/45. L. [place] notified. New order received. Will continue to monitor.   Rochele Pages, RN

## 2013-10-24 ENCOUNTER — Inpatient Hospital Stay (HOSPITAL_COMMUNITY): Payer: Medicare Other

## 2013-10-24 DIAGNOSIS — J811 Chronic pulmonary edema: Secondary | ICD-10-CM

## 2013-10-24 LAB — POCT I-STAT 3, ART BLOOD GAS (G3+)
Acid-base deficit: 2 mmol/L (ref 0.0–2.0)
pCO2 arterial: 36.9 mmHg (ref 35.0–45.0)
pO2, Arterial: 57 mmHg — ABNORMAL LOW (ref 80.0–100.0)

## 2013-10-24 LAB — CBC
MCH: 31.9 pg (ref 26.0–34.0)
Platelets: 256 10*3/uL (ref 150–400)
RBC: 3.7 MIL/uL — ABNORMAL LOW (ref 3.87–5.11)
RDW: 13.3 % (ref 11.5–15.5)
WBC: 24.6 10*3/uL — ABNORMAL HIGH (ref 4.0–10.5)

## 2013-10-24 LAB — BASIC METABOLIC PANEL
BUN: 20 mg/dL (ref 6–23)
CO2: 24 mEq/L (ref 19–32)
CO2: 23 mEq/L (ref 19–32)
Calcium: 7.6 mg/dL — ABNORMAL LOW (ref 8.4–10.5)
Calcium: 7.8 mg/dL — ABNORMAL LOW (ref 8.4–10.5)
Chloride: 95 mEq/L — ABNORMAL LOW (ref 96–112)
Chloride: 93 mEq/L — ABNORMAL LOW (ref 96–112)
GFR calc Af Amer: 58 mL/min — ABNORMAL LOW (ref >90)
GFR calc non Af Amer: 56 mL/min — ABNORMAL LOW (ref >90)
Glucose, Bld: 182 mg/dL — ABNORMAL HIGH (ref 70–99)
Glucose, Bld: 140 mg/dL — ABNORMAL HIGH (ref 70–99)
Potassium: 3.2 mEq/L — ABNORMAL LOW (ref 3.5–5.1)
Sodium: 129 mEq/L — ABNORMAL LOW (ref 135–145)
Sodium: 127 mEq/L — ABNORMAL LOW (ref 135–145)

## 2013-10-24 LAB — PHOSPHORUS: Phosphorus: 2.9 mg/dL (ref 2.3–4.6)

## 2013-10-24 LAB — MAGNESIUM: Magnesium: 1.9 mg/dL (ref 1.5–2.5)

## 2013-10-24 MED ORDER — SODIUM CHLORIDE 0.9 % IJ SOLN
10.0000 mL | INTRAMUSCULAR | Status: DC | PRN
  Administered 2013-10-28 – 2013-10-30 (×5): 10 mL

## 2013-10-24 MED ORDER — ALUM & MAG HYDROXIDE-SIMETH 200-200-20 MG/5ML PO SUSP: 15.0000 mL | ORAL | Status: DC | PRN

## 2013-10-24 MED ORDER — BOOST / RESOURCE BREEZE PO LIQD: 1.0000 | ORAL | Status: DC

## 2013-10-24 MED ORDER — POTASSIUM CHLORIDE 10 MEQ/50ML IV SOLN
10.0000 meq | INTRAVENOUS | Status: AC | PRN
  Administered 2013-10-24 (×3): 10 meq via INTRAVENOUS
  Filled 2013-10-24: qty 50

## 2013-10-24 MED ORDER — ENOXAPARIN SODIUM 40 MG/0.4ML ~~LOC~~ SOLN
40.0000 mg | SUBCUTANEOUS | Status: DC
  Administered 2013-10-24 – 2013-10-28 (×5): 40 mg via SUBCUTANEOUS
  Filled 2013-10-24 (×5): qty 0.4

## 2013-10-24 MED ORDER — BOOST / RESOURCE BREEZE PO LIQD
237.0000 mL | Freq: Three times a day (TID) | ORAL | Status: DC
  Administered 2013-10-25 – 2013-10-30 (×13): 1 via ORAL

## 2013-10-24 MED ORDER — POTASSIUM CHLORIDE 10 MEQ/50ML IV SOLN
10.0000 meq | INTRAVENOUS | Status: AC
  Administered 2013-10-24 (×3): 10 meq via INTRAVENOUS
  Filled 2013-10-24 (×3): qty 50

## 2013-10-24 MED ORDER — SODIUM CHLORIDE 0.9 % IJ SOLN
10.0000 mL | Freq: Two times a day (BID) | INTRAMUSCULAR | Status: DC
  Administered 2013-10-24 – 2013-10-27 (×5): 10 mL
  Filled 2013-10-24: qty 10

## 2013-10-24 MED ORDER — FE FUMARATE-B12-VIT C-FA-IFC PO CAPS
1.0000 | ORAL_CAPSULE | Freq: Three times a day (TID) | ORAL | Status: DC
  Administered 2013-10-24 – 2013-10-30 (×17): 1 via ORAL
  Filled 2013-10-24 (×23): qty 1

## 2013-10-24 MED ORDER — FUROSEMIDE 10 MG/ML IJ SOLN
40.0000 mg | Freq: Once | INTRAMUSCULAR | Status: AC
  Administered 2013-10-24: 40 mg via INTRAVENOUS
  Filled 2013-10-24: qty 4

## 2013-10-24 MED ORDER — ALBUTEROL SULFATE (5 MG/ML) 0.5% IN NEBU: 2.5000 mg | INHALATION_SOLUTION | RESPIRATORY_TRACT | Status: DC | PRN

## 2013-10-24 MED ORDER — PANTOPRAZOLE SODIUM 40 MG PO TBEC
40.0000 mg | DELAYED_RELEASE_TABLET | Freq: Every day | ORAL | Status: DC
  Administered 2013-10-25 – 2013-10-30 (×6): 40 mg via ORAL
  Filled 2013-10-24 (×6): qty 1

## 2013-10-24 MED ORDER — SODIUM CHLORIDE 0.9 % IV SOLN: INTRAVENOUS | Status: DC

## 2013-10-24 NOTE — Progress Notes (Signed)
INITIAL NUTRITION ASSESSMENT  DOCUMENTATION CODES Per approved criteria  -Not Applicable   INTERVENTION: Add Resource Breeze po daily, each supplement provides 250 kcal and 9 grams of protein. May offer Ensure Complete it pt prefers. RD to continue to follow nutrition care plan.  NUTRITION DIAGNOSIS: Inadequate oral intake related to poor appetite as evidenced by pt report.   Goal: Further diet advancement; Intake to meet >90% of estimated nutrition needs.  Monitor:  weight trends, lab trends, I/O's, PO intake, supplement tolerance, further diet advancement  Reason for Assessment: MD Consult for Assessment  67 y.o. female  Admitting Dx: lung mass + pleural effusion  ASSESSMENT: PMHx significant for HTN and depression. Admitted with weakness, back pain, dizziness and SOB; s/p fall 3 days PTA. Pt found to have R-sided lung mass and L-sided pleural effusion.  Underwent thoracentesis on 10/29.  Underwent VATS on 10/31.   RD discussed oral intake with patient. She notes that her appetite is fair and hasn't been great for the past month. Unable to tell me how much she usually weighs. Pt is very sleepy. We discussed her diet, she said she has only had some Sprite and water.  Pt is at nutrition risk given current medical issues and reported poor oral intake.  Sodium is trending down and presently low. Potassium is low at 3.2.  Height: Ht Readings from Last 1 Encounters:  10/22/13 5\' 4"  (1.626 m)    Weight: Wt Readings from Last 1 Encounters:  10/24/13 147 lb 4.3 oz (66.8 kg)    Ideal Body Weight: 120 lb  % Ideal Body Weight: 123%  Wt Readings from Last 10 Encounters:  10/24/13 147 lb 4.3 oz (66.8 kg)  10/24/13 147 lb 4.3 oz (66.8 kg)    Usual Body Weight: n/a  % Usual Body Weight: n/a  BMI:  Body mass index is 25.27 kg/(m^2). Overweight  Estimated Nutritional Needs: Kcal: 1650 - 1800 Protein: 70 - 85 g Fluid: 1.6 - 1.8 liters  Skin: L chest incision  Diet  Order: Full Liquid  EDUCATION NEEDS: -No education needs identified at this time   Intake/Output Summary (Last 24 hours) at 10/24/13 1457 Last data filed at 10/24/13 1300  Gross per 24 hour  Intake   2963 ml  Output   1595 ml  Net   1368 ml    Last BM: 10/29  Labs:   Recent Labs Lab 10/21/13 0543 10/22/13 2210 10/24/13 0415  NA 130* 130* 129*  K 3.5 3.5 3.2*  CL 91* 92* 95*  CO2 27 25 24   BUN 22 35* 24*  CREATININE 0.98 1.44* 1.11*  CALCIUM 9.0 7.9* 7.6*  MG  --   --  1.9  PHOS  --   --  2.9  GLUCOSE 130* 99 182*    CBG (last 3)   Recent Labs  10/22/13 2115  GLUCAP 97    Scheduled Meds: . bisacodyl  10 mg Oral Daily  . Chlorhexidine Gluconate Cloth  6 each Topical Q0600  . ciprofloxacin  400 mg Intravenous Q12H  . clonazePAM  0.5 mg Oral BID  . DULoxetine  60 mg Oral Daily  . enoxaparin (LOVENOX) injection  40 mg Subcutaneous Q24H  . fentaNYL   Intravenous Q4H  . ferrous fumarate-b12-vitamic C-folic acid  1 capsule Oral TID PC  . furosemide  40 mg Intravenous Once  . pantoprazole (PROTONIX) IV  40 mg Intravenous Q24H  . sodium chloride  3 mL Intravenous Q12H    Continuous Infusions: .  sodium chloride    . dextrose 5 % and 0.45% NaCl 100 mL/hr at 10/23/13 1324    Past Medical History  Diagnosis Date  . Hypertension   . Depression   . Anxiety     Past Surgical History  Procedure Laterality Date  . Abdominal hysterectomy      Jarold Motto MS, RD, LDN Pager: [phone] After-hours pager: [phone]

## 2013-10-24 NOTE — Progress Notes (Signed)
PULMONARY  / CRITICAL CARE MEDICINE  Name: Rachel Dodson MRN: [id number] DOB: 09-May-1946    ADMISSION DATE:  10/20/2013 CONSULTATION DATE:  10/23/13   REFERRING MD :  Dr. Tyrone Sage PRIMARY SERVICE: [facility]-->PCCM  CHIEF COMPLAINT:  Post-Op Resp Fx  BRIEF PATIENT DESCRIPTION: 67 y/o F admitted on 10/28 with 3d hx of SOB, back pain & dizziness after a fall.  CT Chest found moderate loculate L pleural effusion & pulmonary nodule.  Underwent VATS drainage 10/31.  PCCM consulted for post-op respiratory failure.     SIGNIFICANT EVENTS / STUDIES:  10/28 - Admit post mechanical fall w/ SOB, back pain.  Loculated effusion on CT + pulm nodule 10/31 - VATS with drainage of empyema / decortication   LINES / TUBES: 10-31 OTT>>10/31 10-31 rt [place] cvl>> 10-31 lt rad aline>> 10-31 lt c t x 2>>  CULTURES: 10-30 pleural fluid>> 10-28 uc>>ecoli ss cipro 10-30 bc x 2>>  ANTIBIOTICS: 10-31 cipro>>  SUBJECTIVE/OVERNIGHT: Feeling much better.  OOB in chair.  No events overnight.   VITAL SIGNS: Temp:  [98 F (36.7 C)-98.6 F (37 C)] 98.6 F (37 C) (11/01 0817) Pulse Rate:  [85-107] 93 (11/01 0800) Resp:  [11-31] 22 (11/01 0800) BP: (106-166)/(51-83) 124/56 mmHg (11/01 0800) SpO2:  [88 %-99 %] 97 % (11/01 0800) Arterial Line BP: (104-162)/(48-69) 137/53 mmHg (11/01 0800) FiO2 (%):  [40 %-50 %] 40 % (10/31 1530) Weight:  [147 lb 4.3 oz (66.8 kg)] 147 lb 4.3 oz (66.8 kg) (11/01 0600) HEMODYNAMICS:   VENTILATOR SETTINGS: Vent Mode:  [-] PSV FiO2 (%):  [40 %-50 %] 40 % Set Rate:  [12 bmp-16 bmp] 16 bmp Vt Set:  [500 mL] 500 mL PEEP:  [5 cmH20] 5 cmH20 Pressure Support:  [5 cmH20-10 cmH20] 5 cmH20 Plateau Pressure:  [15 cmH20-17 cmH20] 17 cmH20 INTAKE / OUTPUT: Intake/Output     10/31 0701 - 11/01 0700 11/01 0701 - 11/02 0700   P.O.  30   I.V. (mL/kg) 2957 (44.3) 311 (4.7)   IV Piggyback 450 100   Total Intake(mL/kg) 3407 (51) 441 (6.6)   Urine (mL/kg/hr) 1010 (0.6) 100 (0.4)   Blood 150  (0.1)    Chest Tube 260 (0.2)    Total Output 1420 100   Net +1987 +341          PHYSICAL EXAMINATION: General: wnwdwf  NAD OOB in chair  Neuro: awake, alert, appropriate, MAE  HEENT:  No JVD. No LAN Cardiovascular:  HSR RRR no mumur Lungs:  resps even non labored on [place], diminished left, ct x 2 from left, no air leak, rt lung clear Abdomen:  +bs, soft Musculoskeletal: intact Skin:  Cool , no edema  LABS:  CBC Recent Labs     10/22/13  1249  10/22/13  2210  10/24/13  0415  WBC  31.0*  26.9*  24.6*  HGB  14.0  13.1  11.8*  HCT  40.9  38.0  34.6*  PLT  297  259  256   Coag's Recent Labs     10/22/13  2210  APTT  34  INR  1.36   BMET Recent Labs     10/22/13  2210  10/24/13  0415  NA  130*  129*  K  3.5  3.2*  CL  92*  95*  CO2  25  24  BUN  35*  24*  CREATININE  1.44*  1.11*  GLUCOSE  99  182*   Electrolytes Recent Labs  10/22/13  2210  10/24/13  0415  CALCIUM  7.9*  7.6*  MG   --   1.9  PHOS   --   2.9   Sepsis Markers No results found for this basename: LACTICACIDVEN, PROCALCITON, O2SATVEN,  in the last 72 hours ABG Recent Labs     10/23/13  0105  10/24/13  1015  PHART  7.379  7.387  PCO2ART  42.5  36.9  PO2ART  60.6*  57.0*   Liver Enzymes Recent Labs     10/22/13  2210  AST  12  ALT  10  ALKPHOS  75  BILITOT  0.3  ALBUMIN  2.4*   Cardiac Enzymes Recent Labs     10/21/13  1123  TROPONINI  <0.30  PROBNP  642.7*   Glucose Recent Labs     10/22/13  2115  GLUCAP  97    Imaging Ct Head W Wo Contrast  10/22/2013   CLINICAL DATA:  Weakness. Abnormal chest x-ray. Fell in shower 5 days ago. Metastatic survey. Hypertension.  EXAM: CT HEAD WITHOUT AND WITH CONTRAST  TECHNIQUE: Contiguous axial images were obtained from the base of the skull through the vertex without and with intravenous contrast  CONTRAST:  50mL OMNIPAQUE IOHEXOL 300 MG/ML  SOLN  COMPARISON:  None.  FINDINGS: Exam is motion degraded.  No skull fracture or  intracranial hemorrhage.  No CT evidence of large acute infarct.  No intracranial enhancing lesion.  Tiny lucencies calvarium may be normal. Small osseous metastatic lesions not entirely excluded.  No hydrocephalus.  IMPRESSION: Exam is motion degraded.  No skull fracture or intracranial hemorrhage.  No CT evidence of large acute infarct.  No intracranial enhancing lesion.  Tiny lucencies calvarium may be normal. Small osseous metastatic lesions not entirely excluded.   Electronically Signed   By: Bridgett Larsson M.D.   On: 10/22/2013 18:41   Ct Abdomen Pelvis W Contrast  10/22/2013   CLINICAL DATA:  Weakness, shortness of breath, back pain, dizziness, chest pain, smoker, fell in shower 5 days ago, abnormal chest radiograph  EXAM: CT ABDOMEN AND PELVIS WITH CONTRAST  TECHNIQUE: Multidetector CT imaging of the abdomen and pelvis was performed using the standard protocol following bolus administration of intravenous contrast. Sagittal and coronal MPR images reconstructed from axial data set.  CONTRAST:  50mL OMNIPAQUE IOHEXOL 300 MG/ML SOLN, OMNIPAQUE IOHEXOL 300 MG/ML SOLN  COMPARISON:  10/20/2013 CT chest  FINDINGS: Bilateral breast prostheses with partial capsular calcification.  Large hiatal hernia.  Small pericardial effusion with moderate sized left pleural effusion and left lower lobe atelectasis.  Minimal nonspecific pleural enhancement is identified at the left lung base similar to prior CT chest, can be seen with reactive processes, infection and tumor.  Dependent density within gallbladder question gallstones versus vicarious excretion of contrast.  Scattered respiratory motion artifacts.  Liver, spleen, pancreas, kidneys, and adrenal glands grossly normal for degree of motion.  Stomach and bowel loops normal appearance.  Excreted material within urinary bladder.  Mild scattered atherosclerotic calcifications.  Stomach and bowel loops normal appearance.  No mass, adenopathy, ascites, or free air.  No  definite fractures.  Scattered degenerative disc disease changes of the thoracic and lumbar spine.  IMPRESSION: No acute intra-abdominal or intrapelvic abnormalities.  Small pericardial effusion.  Moderate left pleural effusion with enhancing pleural margins as discussed above.  Bibasilar atelectasis.  Large hiatal hernia.   Electronically Signed   By: Ulyses Southward M.D.   On: 10/22/2013 17:57  Dg Chest Port 1 View  10/24/2013   CLINICAL DATA:  Status post video assisted thoracoscopic surgery  EXAM: PORTABLE CHEST - 1 VIEW  COMPARISON:  10/23/2013  FINDINGS: Aeration is decreased since previously. Patchy bibasilar airspace opacities most likely represent atelectasis. Support apparatus is stable with the exception of interval removal of previously seen endotracheal tube. Small left apical pneumothorax persists. No mediastinal shift.  IMPRESSION: Decreased aeration with presumably increased bibasilar atelectasis.  Stable trace left apical pneumothorax with chest tubes in place.   Electronically Signed   By: Christiana Pellant M.D.   On: 10/24/2013 10:04   Dg Chest Portable 1 View  10/23/2013   CLINICAL DATA:  Postop vats. Left chest tube.  EXAM: PORTABLE CHEST - 1 VIEW  COMPARISON:  10/21/2013  FINDINGS: Postoperative changes on the left. Two left chest tubes are in place with decreasing left pleural effusion. Tiny left apical pneumothorax. Right subclavian central line is in place with the tip in the upper SVC. No right pneumothorax. Endotracheal tube is approximately 2 cm above the carina.  Small to moderate left pleural effusion persists with left lower lobe atelectasis or infiltrate. No confluent opacity on the right. Heart is mildly enlarged.  IMPRESSION: Postoperative changes on the left. Two left chest tubes in place with small left apical pneumothorax. Decrease in left effusion with continued small left effusion and left lower lobe atelectasis or infiltrate.   Electronically Signed   By: Charlett Nose M.D.    On: 10/23/2013 14:52     CXR: see above  ASSESSMENT / PLAN:  PULMONARY A:VDRF post vats 10-31    Vats for empyema  P:   S/p extubation 10/31 Pulm hygiene  Mobilize  F/u cxr  CT per CVTS  Keep dry as tol  Change BD to PRN   CARDIOVASCULAR A: Hypotension/sepsis from presumed empyema - resolved       CHF P:  KVO fluids  Lasix x 1 11/14 per CVTS   RENAL A: Mild renal insuff base .9 - improved  Hyponatremia  Hypokalemia  P:   F/u chem   GASTROINTESTINAL A:  No active issue  P:   PPI  HEMATOLOGIC A:  No acute issue P:  Monitor cbc  lovenox   INFECTIOUS A:  Post VATS for empyema P:   See flows for abx/ cultures  ENDOCRINE A:  No acute issue P:     NEUROLOGIC A: Post op pain  P:   Pain rx per CVTS    WHITEHEART,KATHRYN, NP 10/24/2013  11:10 AM Pager: [phone] or [phone]  *Care during the described time interval was provided by me and/or other providers on the critical care team. I have reviewed this patient's available data, including medical history, events of note, physical examination and test results as part of my evaluation.  Attending:  I have seen and examined the patient with nurse practitioner/resident and agree with the note above.   Overall improved, volume up today -lasix today -ambulate -hopefully SDU 11/2  Yolonda Kida PCCM Pager: [phone] Cell: [phone] If no response, call [phone]

## 2013-10-24 NOTE — Progress Notes (Signed)
1 Day Post-Op Procedure(s) (LRB): VIDEO BRONCHOSCOPY (N/A) VIDEO ASSISTED THORACOSCOPY (Left) Subjective: Patient progressing after urgent left VATS and drainage-decortication left basilar empyema Operative cultures reviewed and are negative so far Postop chest x-ray with improvement but some generalized edema, Lasix IV dose ordered Morning blood gas on nasal cannula-PO2 59-will increase oxygen from 2-4 L nasal cannula and mobilize out of bed Minimal chest tube drainage Pain control with PCA afebrile sinus rhythm  Objective: Vital signs in last 24 hours: Temp:  [97.8 F (36.6 C)-98.6 F (37 C)] 97.8 F (36.6 C) (11/01 1158) Pulse Rate:  [85-107] 101 (11/01 1400) Cardiac Rhythm:  [-] Normal sinus rhythm (11/01 1200) Resp:  [15-31] 22 (11/01 1400) BP: (109-166)/(51-83) 111/59 mmHg (11/01 1400) SpO2:  [88 %-99 %] 96 % (11/01 1400) Arterial Line BP: (127-162)/(53-69) 127/53 mmHg (11/01 1100) Weight:  [147 lb 4.3 oz (66.8 kg)] 147 lb 4.3 oz (66.8 kg) (11/01 0600)  Hemodynamic parameters for last 24 hours:    Intake/Output from previous day: 10/31 0701 - 11/01 0700 In: 3407 [I.V.:2957; IV Piggyback:450] Out: 1420 [Urine:1010; Blood:150; Chest Tube:260] Intake/Output this shift: Total I/O In: 839 [P.O.:160; I.V.:579; IV Piggyback:100] Out: 1040 [Urine:1000; Chest Tube:40]  Exam Diminished breath sounds left base No air leak from chest tubes Neuro intact  Lab Results:  Recent Labs  10/22/13 2210 10/24/13 0415  WBC 26.9* 24.6*  HGB 13.1 11.8*  HCT 38.0 34.6*  PLT 259 256   BMET:  Recent Labs  10/22/13 2210 10/24/13 0415  NA 130* 129*  K 3.5 3.2*  CL 92* 95*  CO2 25 24  GLUCOSE 99 182*  BUN 35* 24*  CREATININE 1.44* 1.11*  CALCIUM 7.9* 7.6*    PT/INR:  Recent Labs  10/22/13 2210  LABPROT 16.4*  INR 1.36   ABG    Component Value Date/Time   PHART 7.387 10/24/2013 1015   HCO3 22.2 10/24/2013 1015   TCO2 23 10/24/2013 1015   ACIDBASEDEF 2.0 10/24/2013  1015   O2SAT 89.0 10/24/2013 1015   CBG (last 3)   Recent Labs  10/22/13 2115  GLUCAP 97    Assessment/Plan: S/P Procedure(s) (LRB): VIDEO BRONCHOSCOPY (N/A) VIDEO ASSISTED THORACOSCOPY (Left) Plan Mobilize out of bed Nutritional assessment and supplementation Continue broad-spectrum antibiotics, follow culture results We will chest tubes today   LOS: 4 days    VAN TRIGT III,PETER 10/24/2013

## 2013-10-25 ENCOUNTER — Inpatient Hospital Stay (HOSPITAL_COMMUNITY): Payer: Medicare Other

## 2013-10-25 DIAGNOSIS — D72829 Elevated white blood cell count, unspecified: Secondary | ICD-10-CM

## 2013-10-25 LAB — PREALBUMIN: Prealbumin: 6.2 mg/dL — ABNORMAL LOW (ref 17.0–34.0)

## 2013-10-25 LAB — CBC
HCT: 34.4 % — ABNORMAL LOW (ref 36.0–46.0)
Hemoglobin: 12 g/dL (ref 12.0–15.0)
MCH: 32.3 pg (ref 26.0–34.0)
MCHC: 34.9 g/dL (ref 30.0–36.0)
MCV: 92.5 fL (ref 78.0–100.0)
Platelets: 255 10*3/uL (ref 150–400)
RBC: 3.72 MIL/uL — ABNORMAL LOW (ref 3.87–5.11)

## 2013-10-25 LAB — CULTURE, RESPIRATORY W GRAM STAIN

## 2013-10-25 LAB — BODY FLUID CULTURE

## 2013-10-25 LAB — COMPREHENSIVE METABOLIC PANEL
ALT: 13 U/L (ref 0–35)
Albumin: 2.2 g/dL — ABNORMAL LOW (ref 3.5–5.2)
Calcium: 7.9 mg/dL — ABNORMAL LOW (ref 8.4–10.5)
Creatinine, Ser: 0.91 mg/dL (ref 0.50–1.10)
GFR calc Af Amer: 73 mL/min — ABNORMAL LOW (ref >90)
GFR calc non Af Amer: 63 mL/min — ABNORMAL LOW (ref >90)
Glucose, Bld: 154 mg/dL — ABNORMAL HIGH (ref 70–99)
Sodium: 127 mEq/L — ABNORMAL LOW (ref 135–145)
Total Protein: 5.4 g/dL — ABNORMAL LOW (ref 6.0–8.3)

## 2013-10-25 LAB — SURGICAL PCR SCREEN
MRSA, PCR: NEGATIVE
Staphylococcus aureus: NEGATIVE

## 2013-10-25 MED ORDER — VANCOMYCIN HCL 500 MG IV SOLR
500.0000 mg | Freq: Two times a day (BID) | INTRAVENOUS | Status: DC
  Administered 2013-10-25 – 2013-10-27 (×4): 500 mg via INTRAVENOUS
  Filled 2013-10-25 (×5): qty 500

## 2013-10-25 MED ORDER — GUAIFENESIN 100 MG/5ML PO SYRP
200.0000 mg | ORAL_SOLUTION | ORAL | Status: DC | PRN
  Administered 2013-10-25 – 2013-10-27 (×4): 200 mg via ORAL
  Filled 2013-10-25 (×4): qty 10

## 2013-10-25 MED ORDER — SODIUM CHLORIDE 0.9 % IV SOLN
250.0000 mg | Freq: Four times a day (QID) | INTRAVENOUS | Status: DC
  Administered 2013-10-25 – 2013-10-27 (×8): 250 mg via INTRAVENOUS
  Filled 2013-10-25 (×10): qty 250

## 2013-10-25 MED ORDER — FUROSEMIDE 10 MG/ML IJ SOLN
40.0000 mg | Freq: Every day | INTRAMUSCULAR | Status: DC
  Administered 2013-10-25 – 2013-10-26 (×2): 40 mg via INTRAVENOUS
  Filled 2013-10-25 (×2): qty 4

## 2013-10-25 MED ORDER — POTASSIUM CHLORIDE 10 MEQ/50ML IV SOLN
10.0000 meq | INTRAVENOUS | Status: AC
  Administered 2013-10-25 (×2): 10 meq via INTRAVENOUS
  Filled 2013-10-25 (×2): qty 50

## 2013-10-25 MED ORDER — METOPROLOL TARTRATE 12.5 MG HALF TABLET
12.5000 mg | ORAL_TABLET | Freq: Two times a day (BID) | ORAL | Status: DC
  Administered 2013-10-25 – 2013-10-30 (×11): 12.5 mg via ORAL
  Filled 2013-10-25 (×13): qty 1

## 2013-10-25 NOTE — Progress Notes (Signed)
2 Days Post-Op Procedure(s) (LRB): VIDEO BRONCHOSCOPY (N/A) VIDEO ASSISTED THORACOSCOPY (Left) Subjective: Slow improvement after left VATS with drainage and decortication of empyema Chest x-ray today shows decrease in aeration at left base O2 sats adequate on nasal cannula Patient with a week aprotinin and symptoms from it or so flutter valve has been started Breath sounds only slightly diminished on the left by exam, minimal chest tube drainage and no airleak Antibiotic coverage broadened now to vancomycin and Primaxin-cultures are unrevealing and white count is 19,000 Patient evaluated by nutrition and started on supplemental protein drinks Patient remains in sinus tachycardia and we'll start low-dose oral beta blocker. Hemoglobin has been stable  Objective: Vital signs in last 24 hours: Temp:  [97.8 F (36.6 C)-98.7 F (37.1 C)] 97.8 F (36.6 C) (11/02 1142) Pulse Rate:  [89-124] 89 (11/02 1400) Cardiac Rhythm:  [-] Normal sinus rhythm (11/02 1200) Resp:  [16-26] 24 (11/02 1400) BP: (92-138)/(58-102) 92/58 mmHg (11/02 1400) SpO2:  [90 %-100 %] 100 % (11/02 1400)  Hemodynamic parameters for last 24 hours:   afebrile sinus tachycardia  Intake/Output from previous day: 11/01 0701 - 11/02 0700 In: 1805.8 [P.O.:330; I.V.:725.8; IV Piggyback:750] Out: 3035 [Urine:2945; Chest Tube:90] Intake/Output this shift: Total I/O In: 490 [I.V.:140; IV Piggyback:350] Out: 260 [Urine:210; Chest Tube:50]  Comfortable, pain well-controlled Breath sounds only slightly diminished at left base No significant edema  Lab Results:  Recent Labs  10/24/13 0415 10/25/13 0345  WBC 24.6* 19.7*  HGB 11.8* 12.0  HCT 34.6* 34.4*  PLT 256 255   BMET:  Recent Labs  10/24/13 1700 10/25/13 0345  NA 127* 127*  K 3.4* 3.3*  CL 93* 92*  CO2 23 26  GLUCOSE 140* 154*  BUN 20 17  CREATININE 1.01 0.91  CALCIUM 7.8* 7.9*    PT/INR:  Recent Labs  10/22/13 2210  LABPROT 16.4*  INR 1.36    ABG    Component Value Date/Time   PHART 7.387 10/24/2013 1015   HCO3 22.2 10/24/2013 1015   TCO2 23 10/24/2013 1015   ACIDBASEDEF 2.0 10/24/2013 1015   O2SAT 89.0 10/24/2013 1015   CBG (last 3)   Recent Labs  10/22/13 2115  GLUCAP 97    Assessment/Plan: S/P Procedure(s) (LRB): VIDEO BRONCHOSCOPY (N/A) VIDEO ASSISTED THORACOSCOPY (Left) Continue IV antibiotics-vancomycin and Primaxin Leave chest tubes in place Mobilize and continue with physical therapy Keep in ICU due to borderline O2 sat   LOS: 5 days    VAN TRIGT III,Emila Steinhauser 10/25/2013

## 2013-10-25 NOTE — Progress Notes (Signed)
Patient examined and record reviewed.Hemodynamics stable,labs satisfactory.Patient had stable day.Continue current care. VAN TRIGT III,PETER 10/25/2013

## 2013-10-25 NOTE — Progress Notes (Addendum)
K+= 3.3 and creat= 0.91w/ urine o/p > 30cc/hr; TCTS KCL protocol initiated with 10 mEq KCL in 50 cc x 3, each over one hour.

## 2013-10-25 NOTE — Progress Notes (Addendum)
Approximately 32 cc of Fentanyl from PCA syringe wasted in sink.  Witnessed. Eshaan Titzer, [facility]

## 2013-10-25 NOTE — Progress Notes (Signed)
PULMONARY  / CRITICAL CARE MEDICINE  Name: Rachel Dodson MRN: [id number] DOB: [birth year]    ADMISSION DATE:  10/20/2013 CONSULTATION DATE:  10/23/13   REFERRING MD :  Dr. Tyrone Sage PRIMARY SERVICE: [facility]-->PCCM  CHIEF COMPLAINT:  Post-Op Resp Fx  BRIEF PATIENT DESCRIPTION: 67 y/o F admitted on 10/28 with 3d hx of SOB, back pain & dizziness after a fall.  CT Chest found moderate loculate L pleural effusion & pulmonary nodule.  Underwent VATS drainage 10/31.  PCCM consulted for post-op respiratory failure.     SIGNIFICANT EVENTS / STUDIES:  10/28 - Admit post mechanical fall w/ SOB, back pain.  Loculated effusion on CT + pulm nodule 10/31 - VATS with drainage of empyema / decortication   LINES / TUBES: 10-31 OTT>>10/31 10-31 rt [place] cvl>> 10-31 lt rad aline>> 10-31 lt c t x 2>>  CULTURES: 10-30 pleural fluid>> 10-28 uc>>ecoli ss cipro 10-30 bc x 2>>  ANTIBIOTICS: 10-31 cipro>> 11/1  11/2 vanc >> 11/2 imi >>  SUBJECTIVE/OVERNIGHT: Feels about the same, back pain, headache Diuresed better  VITAL SIGNS: Temp:  [97.8 F (36.6 C)-98.7 F (37.1 C)] 97.8 F (36.6 C) (11/02 0719) Pulse Rate:  [92-124] 112 (11/02 1000) Resp:  [16-26] 26 (11/02 1000) BP: (111-139)/(59-102) 123/64 mmHg (11/02 1000) SpO2:  [90 %-100 %] 94 % (11/02 1000) Arterial Line BP: (127)/(53) 127/53 mmHg (11/01 1100) HEMODYNAMICS:   VENTILATOR SETTINGS:   INTAKE / OUTPUT: Intake/Output     11/01 0701 - 11/02 0700 11/02 0701 - 11/03 0700   P.O. 330    I.V. (mL/kg) 725.8 (10.9) 60 (0.9)   IV Piggyback 750    Total Intake(mL/kg) 1805.8 (27) 60 (0.9)   Urine (mL/kg/hr) 2945 (1.8) 100 (0.4)   Blood     Chest Tube 90 (0.1) 20 (0.1)   Total Output 3035 120   Net -1229.2 -60          PHYSICAL EXAMINATION: General: chronically ill appearing HEENT: NCAT, OP clear PULM: diminished s, few crackles r CV: s1/s2 AB: bS+, nontender Ext: cool, trace edema  LABS:  CBC Recent Labs     10/22/13  2210   10/24/13  0415  10/25/13  0345  WBC  26.9*  24.6*  19.7*  HGB  13.1  11.8*  12.0  HCT  38.0  34.6*  34.4*  PLT  259  256  255   Coag's Recent Labs     10/22/13  2210  APTT  34  INR  1.36   BMET Recent Labs     10/24/13  0415  10/24/13  1700  10/25/13  0345  NA  129*  127*  127*  K  3.2*  3.4*  3.3*  CL  95*  93*  92*  CO2  24  23  26   BUN  24*  20  17  CREATININE  1.11*  1.01  0.91  GLUCOSE  182*  140*  154*   Electrolytes Recent Labs     10/24/13  0415  10/24/13  1700  10/25/13  0345  CALCIUM  7.6*  7.8*  7.9*  MG  1.9   --    --   PHOS  2.9   --    --    Sepsis Markers No results found for this basename: LACTICACIDVEN, PROCALCITON, O2SATVEN,  in the last 72 hours ABG Recent Labs     10/23/13  0105  10/24/13  1015  PHART  7.379  7.387  PCO2ART  42.5  36.9  PO2ART  60.6*  57.0*   Liver Enzymes Recent Labs     10/22/13  2210  10/25/13  0345  AST  12  14  ALT  10  13  ALKPHOS  75  67  BILITOT  0.3  0.3  ALBUMIN  2.4*  2.2*   Cardiac Enzymes No results found for this basename: TROPONINI, PROBNP,  in the last 72 hours Glucose Recent Labs     10/22/13  2115  GLUCAP  97    Imaging Dg Chest Port 1 View  10/25/2013   CLINICAL DATA:  Status post VATS.  EXAM: PORTABLE CHEST - 1 VIEW  COMPARISON:  10/24/2013  FINDINGS: Right-sided central line tip overlies the level of the superior vena cava.  The heart is enlarged. There is a small left apical pneumothorax, unchanged. Significant left lower lobe parenchymal opacity and pleural effusion again noted. A left-sided chest tube is in place, unchanged in appearance. There is density at the medial right lung base, with some interval improvement in aeration.  IMPRESSION: 1. Cardiomegaly and bibasilar opacities, left greater than right. 2. Some interval improvement in aeration at the right lung base. 3. Stable left apical pneumothorax.   Electronically Signed   By: Rosalie Gums M.D.   On: 10/25/2013 07:40   Dg  Chest Port 1 View  10/24/2013   CLINICAL DATA:  Status post video assisted thoracoscopic surgery  EXAM: PORTABLE CHEST - 1 VIEW  COMPARISON:  10/23/2013  FINDINGS: Aeration is decreased since previously. Patchy bibasilar airspace opacities most likely represent atelectasis. Support apparatus is stable with the exception of interval removal of previously seen endotracheal tube. Small left apical pneumothorax persists. No mediastinal shift.  IMPRESSION: Decreased aeration with presumably increased bibasilar atelectasis.  Stable trace left apical pneumothorax with chest tubes in place.   Electronically Signed   By: Christiana Pellant M.D.   On: 10/24/2013 10:04   Dg Chest Portable 1 View  10/23/2013   CLINICAL DATA:  Postop vats. Left chest tube.  EXAM: PORTABLE CHEST - 1 VIEW  COMPARISON:  10/21/2013  FINDINGS: Postoperative changes on the left. Two left chest tubes are in place with decreasing left pleural effusion. Tiny left apical pneumothorax. Right subclavian central line is in place with the tip in the upper SVC. No right pneumothorax. Endotracheal tube is approximately 2 cm above the carina.  Small to moderate left pleural effusion persists with left lower lobe atelectasis or infiltrate. No confluent opacity on the right. Heart is mildly enlarged.  IMPRESSION: Postoperative changes on the left. Two left chest tubes in place with small left apical pneumothorax. Decrease in left effusion with continued small left effusion and left lower lobe atelectasis or infiltrate.   Electronically Signed   By: Charlett Nose M.D.   On: 10/23/2013 14:52     CXR: see above  ASSESSMENT / PLAN:  PULMONARY A:VDRF post vats 10-31 > resolved    Vats for empyema  P:   Pulm hygiene  Mobilize  Lasix again 11/2 F/u cxr  CT per CVTS  Keep dry as tol   CARDIOVASCULAR A: Hypotension/sepsis from presumed empyema - resolved       CHF P:  KVO fluids  Lasix x 2 11/14 per CVTS   RENAL A: Mild renal insuff base .9 -  improved  Hyponatremia > likely [place], stable Hypokalemia  P:   F/u chem   GASTROINTESTINAL A:  No active issue  P:   PPI  HEMATOLOGIC A:  No acute issue P:  Monitor cbc  lovenox   INFECTIOUS A:  PostVATS for empyema> cultures negative, wbc improvign P:   Vanc/Imi started by CVTS F/u culture  ENDOCRINE A:  No acute issue P:     NEUROLOGIC A: Post op pain  P:   Pain rx per CVTS   Overall improving on my exam and objectively (WBC, renal function, CXR)  SDU when bed available.  Yolonda Kida PCCM Pager: [phone] Cell: [phone] If no response, call [phone]

## 2013-10-25 NOTE — Progress Notes (Addendum)
K+= 3.4 and creat= 1.01 w/ urine o/p > 30cc/hr; TCTS KCL protocol initiated with KCL in 50 cc x 3, each over one hour.

## 2013-10-25 NOTE — Evaluation (Signed)
Physical Therapy Evaluation Patient Details Name: Rachel Dodson MRN: [id number] DOB: [birth year] Today's Date: 10/25/2013 Time: 1207-1230 PT Time Calculation (min): 23 min  PT Assessment / Plan / Recommendation History of Present Illness  67 y/o F admitted on 10/28 with 3d hx of SOB, back pain & dizziness after a fall.  CT Chest found moderate loculate L pleural effusion & pulmonary nodule.  Underwent VATS drainage 10/31.   Clinical Impression  Pt admitted with the above. Pt currently with functional limitations due to the deficits listed below (see PT Problem List). Pt with anxiety and overall concern about d/c home without assistance available.  Therefore recommend further therapy prior to d/c home.  Pt would be a great inpatient rehabilitation candidate to become modified independent prior to d/c home.  Pt will benefit from skilled PT to increase their independence and safety with mobility to allow discharge to the venue listed below.      PT Assessment  Patient needs continued PT services    Follow Up Recommendations  CIR    Equipment Recommendations  Rolling walker with 5" wheels    Recommendations for Other Services Rehab consult   Frequency Min 3X/week    Precautions / Restrictions Precautions Precautions: Fall   Pertinent Vitals/Pain No c/o pain      Mobility  Bed Mobility Bed Mobility: Not assessed (pt sitting in recliner when entering) Transfers Transfers: Sit to Stand;Stand to Sit Sit to Stand: 4: Min assist;From chair/3-in-1 Stand to Sit: 4: Min assist;To chair/3-in-1 Details for Transfer Assistance: (A) to initiate transfer and slowly descend to recliner with max cues for hand placement. Pt tends to keep hands on RW during transfers.  Ambulation/Gait Ambulation/Gait Assistance: 4: Min guard;4: Min Environmental consultant (Feet): 100 Feet Assistive device: Other (Comment) (w/c to hold lines) Ambulation/Gait Assistance Details: Occasional min (A) to maintain  balance with cues for upright posture.  Noticeable LE fatigue with increase distance.  Gait Pattern: Step-through pattern;Decreased stride length;Shuffle;Trunk flexed Gait velocity: decreased Stairs: No    Exercises     PT Diagnosis: Difficulty walking;Generalized weakness  PT Problem List: Decreased strength;Decreased activity tolerance;Decreased balance;Decreased mobility;Decreased knowledge of use of DME;Cardiopulmonary status limiting activity PT Treatment Interventions: DME instruction;Gait training;Stair training;Functional mobility training;Therapeutic activities;Therapeutic exercise;Patient/family education;Balance training     PT Goals(Current goals can be found in the care plan section) Acute Rehab PT Goals Patient Stated Goal: Did not set; pt very concerned about d/c home PT Goal Formulation: With patient Time For Goal Achievement: 11/01/13 Potential to Achieve Goals: Good  Visit Information  Last PT Received On: 10/25/13 Assistance Needed: +2 History of Present Illness: 67 y/o F admitted on 10/28 with 3d hx of SOB, back pain & dizziness after a fall.  CT Chest found moderate loculate L pleural effusion & pulmonary nodule.  Underwent VATS drainage 10/31.        Prior Functioning  Home Living Family/patient expects to be discharged to:: Private residence Living Arrangements: Alone Type of Home: House Home Access: Stairs to enter Secretary/administrator of Steps: 2 Entrance Stairs-Rails: None Home Layout: One level Home Equipment: None Prior Function Level of Independence: Independent Dominant Hand: Right    Cognition  Cognition Arousal/Alertness: Awake/alert Behavior During Therapy: Flat affect Overall Cognitive Status: Within Functional Limits for tasks assessed    Extremity/Trunk Assessment Lower Extremity Assessment Lower Extremity Assessment: Overall WFL for tasks assessed   Balance    End of Session PT - End of Session Equipment Utilized During  Treatment: Gait belt;Oxygen (  3L) Activity Tolerance: Patient limited by fatigue Patient left: in chair;with call bell/phone within reach Nurse Communication: Mobility status  GP     Nakeia Calvi 10/25/2013, 1:48 PM  Jake Shark, PT DPT [phone]

## 2013-10-26 ENCOUNTER — Inpatient Hospital Stay (HOSPITAL_COMMUNITY): Payer: Medicare Other

## 2013-10-26 LAB — POCT I-STAT 3, ART BLOOD GAS (G3+)
Bicarbonate: 24 mEq/L (ref 20.0–24.0)
Bicarbonate: 23.5 mEq/L (ref 20.0–24.0)
O2 Saturation: 93 %
O2 Saturation: 90 %
TCO2: 25 mmol/L (ref 0–100)
TCO2: 25 mmol/L (ref 0–100)
pCO2 arterial: 46.1 mmHg — ABNORMAL HIGH (ref 35.0–45.0)
pCO2 arterial: 41 mmHg (ref 35.0–45.0)
pH, Arterial: 7.323 — ABNORMAL LOW (ref 7.350–7.450)
pH, Arterial: 7.365 (ref 7.350–7.450)
pO2, Arterial: 72 mmHg — ABNORMAL LOW (ref 80.0–100.0)
pO2, Arterial: 60 mmHg — ABNORMAL LOW (ref 80.0–100.0)

## 2013-10-26 LAB — COMPREHENSIVE METABOLIC PANEL
ALT: 16 U/L (ref 0–35)
AST: 19 U/L (ref 0–37)
Albumin: 2.1 g/dL — ABNORMAL LOW (ref 3.5–5.2)
Alkaline Phosphatase: 97 U/L (ref 39–117)
BUN: 16 mg/dL (ref 6–23)
CO2: 26 mEq/L (ref 19–32)
Calcium: 8 mg/dL — ABNORMAL LOW (ref 8.4–10.5)
Chloride: 94 mEq/L — ABNORMAL LOW (ref 96–112)
Creatinine, Ser: 0.81 mg/dL (ref 0.50–1.10)
GFR calc Af Amer: 85 mL/min — ABNORMAL LOW (ref >90)
GFR calc non Af Amer: 73 mL/min — ABNORMAL LOW (ref >90)
Glucose, Bld: 116 mg/dL — ABNORMAL HIGH (ref 70–99)
Potassium: 3.5 mEq/L (ref 3.5–5.1)
Sodium: 128 mEq/L — ABNORMAL LOW (ref 135–145)
Total Bilirubin: 0.3 mg/dL (ref 0.3–1.2)
Total Protein: 5.5 g/dL — ABNORMAL LOW (ref 6.0–8.3)

## 2013-10-26 LAB — CBC
HCT: 34.1 % — ABNORMAL LOW (ref 36.0–46.0)
MCHC: 33.4 g/dL (ref 30.0–36.0)
Platelets: 267 10*3/uL (ref 150–400)
RDW: 13.7 % (ref 11.5–15.5)
WBC: 18.5 10*3/uL — ABNORMAL HIGH (ref 4.0–10.5)

## 2013-10-26 LAB — BODY FLUID CULTURE: Culture: NO GROWTH

## 2013-10-26 MED ORDER — FUROSEMIDE 10 MG/ML IJ SOLN
40.0000 mg | Freq: Two times a day (BID) | INTRAMUSCULAR | Status: DC
  Administered 2013-10-26 – 2013-10-27 (×2): 40 mg via INTRAVENOUS
  Filled 2013-10-26 (×4): qty 4

## 2013-10-26 MED ORDER — POTASSIUM CHLORIDE 10 MEQ/50ML IV SOLN
10.0000 meq | INTRAVENOUS | Status: AC
  Administered 2013-10-26 (×3): 10 meq via INTRAVENOUS
  Filled 2013-10-26 (×3): qty 50

## 2013-10-26 NOTE — Progress Notes (Signed)
PULMONARY  / CRITICAL CARE MEDICINE  Name: Rachel Dodson MRN: [id number] DOB: 1946-09-05    ADMISSION DATE:  10/20/2013 CONSULTATION DATE:  10/23/13   REFERRING MD :  Dr. Tyrone Sage PRIMARY SERVICE: TRH-->PCCM  CHIEF COMPLAINT:  Post-Op Resp Fx  BRIEF PATIENT DESCRIPTION: 67 y/o F admitted on 10/28 with 3d hx of SOB, back pain & dizziness after a fall.  CT Chest found moderate loculate L pleural effusion & pulmonary nodule.  Underwent VATS drainage 10/31.  PCCM consulted for post-op respiratory failure.     SIGNIFICANT EVENTS / STUDIES:  10/28 - Admit post mechanical fall w/ SOB, back pain.  Loculated effusion on CT + pulm nodule 10/31 - VATS with drainage of empyema / decortication   LINES / TUBES: 10-31 OTT>>10/31 10-31 rt [place] cvl>> 10-31 lt rad aline>> 10-31 lt c t x 2>>  CULTURES: 10-30 pleural fluid>>  Neg afb  Neg yeast  10-28 uc>>ecoli ss cipro 10-30 bc x 2>>  ANTIBIOTICS: 10-31 cipro>> 11/1  11/2 vanc >> 11/2 imi >>  SUBJECTIVE/OVERNIGHT: In chair , no distress  VITAL SIGNS: Temp:  [97.8 F (36.6 C)-98.3 F (36.8 C)] 98.3 F (36.8 C) (11/03 0831) Pulse Rate:  [74-120] 110 (11/03 0900) Resp:  [13-26] 18 (11/03 0900) BP: (92-133)/(56-80) 111/56 mmHg (11/03 0900) SpO2:  [90 %-100 %] 99 % (11/03 0900) HEMODYNAMICS:   VENTILATOR SETTINGS:   INTAKE / OUTPUT: Intake/Output     11/02 0701 - 11/03 0700 11/03 0701 - 11/04 0700   P.O. 320 250   I.V. (mL/kg) 380 (5.7) 40 (0.6)   Other 360    IV Piggyback 850 50   Total Intake(mL/kg) 1910 (28.6) 340 (5.1)   Urine (mL/kg/hr) 1165 (0.7)    Chest Tube 70 (0)    Total Output 1235     Net +675 +340          PHYSICAL EXAMINATION: General: no distres in chair HEENT: jvd  PULM: diminished left base CV: s1/s2 AB: bS+, nontender Ext: cool, trace edema  LABS:  CBC Recent Labs     10/24/13  0415  10/25/13  0345  10/26/13  0400  WBC  24.6*  19.7*  18.5*  HGB  11.8*  12.0  11.4*  HCT  34.6*  34.4*   34.1*  PLT  256  255  267   Coag's No results found for this basename: APTT, INR,  in the last 72 hours BMET Recent Labs     10/24/13  1700  10/25/13  0345  10/26/13  0400  NA  127*  127*  128*  K  3.4*  3.3*  3.5  CL  93*  92*  94*  CO2  23  26  26   BUN  20  17  16   CREATININE  1.01  0.91  0.81  GLUCOSE  140*  154*  116*   Electrolytes Recent Labs     10/24/13  0415  10/24/13  1700  10/25/13  0345  10/26/13  0400  CALCIUM  7.6*  7.8*  7.9*  8.0*  MG  1.9   --    --    --   PHOS  2.9   --    --    --    Sepsis Markers No results found for this basename: LACTICACIDVEN, PROCALCITON, O2SATVEN,  in the last 72 hours ABG Recent Labs     10/24/13  1015  PHART  7.387  PCO2ART  36.9  PO2ART  57.0*  Liver Enzymes Recent Labs     10/25/13  0345  10/26/13  0400  AST  14  19  ALT  13  16  ALKPHOS  67  97  BILITOT  0.3  0.3  ALBUMIN  2.2*  2.1*   Cardiac Enzymes No results found for this basename: TROPONINI, PROBNP,  in the last 72 hours Glucose No results found for this basename: GLUCAP,  in the last 72 hours  Imaging Dg Chest Port 1 View  10/26/2013   CLINICAL DATA:  VATS.  EXAM: PORTABLE CHEST - 1 VIEW  COMPARISON:  10/25/2013 and 10/24/2013  FINDINGS: Patient is rotated to the right. Right subclavian central venous catheter is unchanged. 2 left-sided chest tubes are unchanged. No definite pleural line seen over the left apex. Pleural-based left apical density likely fluid in the pleural space. Lungs are hypoinflated with continued evidence of a moderate size left effusion/atelectasis. Stable cardiomegaly. Remainder of the exam is unchanged.  IMPRESSION: Stable moderate left pleural fluid collection likely with associated atelectasis. Likely fluid tracking to the left apex.  No definite left pneumothorax seen.  Tubes and lines unchanged.  Stable cardiomegaly.   Electronically Signed   By: Elberta Fortis M.D.   On: 10/26/2013 07:37   Dg Chest Port 1 View  10/25/2013    CLINICAL DATA:  Status post VATS.  EXAM: PORTABLE CHEST - 1 VIEW  COMPARISON:  10/24/2013  FINDINGS: Right-sided central line tip overlies the level of the superior vena cava.  The heart is enlarged. There is a small left apical pneumothorax, unchanged. Significant left lower lobe parenchymal opacity and pleural effusion again noted. A left-sided chest tube is in place, unchanged in appearance. There is density at the medial right lung base, with some interval improvement in aeration.  IMPRESSION: 1. Cardiomegaly and bibasilar opacities, left greater than right. 2. Some interval improvement in aeration at the right lung base. 3. Stable left apical pneumothorax.   Electronically Signed   By: Rosalie Gums M.D.   On: 10/25/2013 07:40   Dg Chest Port 1 View  10/24/2013   CLINICAL DATA:  Status post video assisted thoracoscopic surgery  EXAM: PORTABLE CHEST - 1 VIEW  COMPARISON:  10/23/2013  FINDINGS: Aeration is decreased since previously. Patchy bibasilar airspace opacities most likely represent atelectasis. Support apparatus is stable with the exception of interval removal of previously seen endotracheal tube. Small left apical pneumothorax persists. No mediastinal shift.  IMPRESSION: Decreased aeration with presumably increased bibasilar atelectasis.  Stable trace left apical pneumothorax with chest tubes in place.   Electronically Signed   By: Christiana Pellant M.D.   On: 10/24/2013 10:04     CXR: see above  ASSESSMENT / PLAN:  PULMONARY A:VDRF post vats 10-31 > resolved    Vats for empyema  P:   Pulm hygiene  Mobilize, she is ambulating Lasix again 11/2 with still pos balance, would like to increase this to neg 500 cc to 1 liter as goal pcxr in am , no ptx noted on todays film  CARDIOVASCULAR A: Hypotension/sepsis from presumed empyema - resolved       CHF P:  KVO fluids  Lasix consider increase   RENAL A: Mild renal insuff base .9 - improved  Hyponatremia > likely siadh,  stable Hypokalemia Continue pos balance P:   F/u chem in am Consider increase lasix and follow NA, especially if SIADH on differential  GASTROINTESTINAL A:  No active issue  P:   PPI diet  HEMATOLOGIC  A:  No acute issue P:  Monitor cbc  Lovenox, follow daily crt  INFECTIOUS A:  PostVATS for empyema> cultures negative P:   Vanc/Imi started by CVTS, would narrow off this inam if remains culture negative in am on micro F/u culture in am , neg afb, yeast noted  NEUROLOGIC A: Post op pain  P:   Pain rx per CVTS  ambulating  Consider transfer sdu Narrow abx in am if remains neg Consider lasix increase  Kizzie Bane PCCM Pager: [phone] Cell: [phone] If no response, call [phone]

## 2013-10-26 NOTE — Progress Notes (Signed)
Patient ID: Rachel Dodson, female   DOB: [birth year], 68 y.o.   MRN: [id number] TCTS DAILY ICU PROGRESS NOTE                   [address]            [place] [id number]          [phone]   3 Days Post-Op Procedure(s) (LRB): VIDEO BRONCHOSCOPY (N/A) VIDEO ASSISTED THORACOSCOPY (Left)  Total Length of Stay:  LOS: 6 days   Subjective: Feels better, cough improved  Objective: Vital signs in last 24 hours: Temp:  [97.8 F (36.6 C)-98.3 F (36.8 C)] 98.3 F (36.8 C) (11/03 0831) Pulse Rate:  [74-120] 90 (11/03 1100) Cardiac Rhythm:  [-] Normal sinus rhythm (11/03 1000) Resp:  [13-24] 18 (11/03 1100) BP: (92-133)/(56-80) 106/63 mmHg (11/03 1100) SpO2:  [90 %-100 %] 98 % (11/03 1100)  Filed Weights   10/20/13 2147 10/22/13 2152 10/24/13 0600  Weight: 139 lb 12.4 oz (63.4 kg) 141 lb 12.1 oz (64.3 kg) 147 lb 4.3 oz (66.8 kg)    Weight change:    Hemodynamic parameters for last 24 hours:    Intake/Output from previous day: 11/02 0701 - 11/03 0700 In: 1910 [P.O.:320; I.V.:380; IV Piggyback:850] Out: 1235 [Urine:1165; Chest Tube:70]  Intake/Output this shift: Total I/O In: 380 [P.O.:250; I.V.:80; IV Piggyback:50] Out: -   Current Meds: Scheduled Meds: . bisacodyl  10 mg Oral Daily  . clonazePAM  0.5 mg Oral BID  . DULoxetine  60 mg Oral Daily  . enoxaparin (LOVENOX) injection  40 mg Subcutaneous Q24H  . feeding supplement (RESOURCE BREEZE)  237 mL Oral TID WC  . ferrous fumarate-b12-vitamic C-folic acid  1 capsule Oral TID PC  . furosemide  40 mg Intravenous BID  . imipenem-cilastatin  250 mg Intravenous Q6H  . metoprolol tartrate  12.5 mg Oral BID  . pantoprazole  40 mg Oral Q1200  . sodium chloride  10-40 mL Intracatheter Q12H  . sodium chloride  3 mL Intravenous Q12H  . vancomycin  500 mg Intravenous Q12H   Continuous Infusions: . sodium chloride    . dextrose 5 % and 0.45% NaCl 20 mL/hr at 10/25/13 1156   PRN Meds:.sodium chloride,  acetaminophen, albuterol, alum & mag hydroxide-simeth, guaifenesin, labetalol, ondansetron (ZOFRAN) IV, ondansetron (ZOFRAN) IV, oxyCODONE-acetaminophen, potassium chloride, promethazine, sodium chloride, sodium chloride  General appearance: alert and cooperative Neurologic: intact Heart: regular rate and rhythm, S1, S2 normal, no murmur, click, rub or gallop Lungs: diminished breath sounds LLL Abdomen: soft, non-tender; bowel sounds normal; no masses,  no organomegaly Extremities: extremities normal, atraumatic, no cyanosis or edema and Homans sign is negative, no sign of DVT Wound: no air leak  Lab Results: CBC: Recent Labs  10/25/13 0345 10/26/13 0400  WBC 19.7* 18.5*  HGB 12.0 11.4*  HCT 34.4* 34.1*  PLT 255 267   BMET:  Recent Labs  10/25/13 0345 10/26/13 0400  NA 127* 128*  K 3.3* 3.5  CL 92* 94*  CO2 26 26  GLUCOSE 154* 116*  BUN 17 16  CREATININE 0.91 0.81  CALCIUM 7.9* 8.0*    PT/INR: No results found for this basename: LABPROT, INR,  in the last 72 hours Radiology: Dg Chest Port 1 View  10/26/2013   CLINICAL DATA:  VATS.  EXAM: PORTABLE CHEST - 1 VIEW  COMPARISON:  10/25/2013 and 10/24/2013  FINDINGS: Patient is rotated to the right. Right subclavian central venous catheter is unchanged.  2 left-sided chest tubes are unchanged. No definite pleural line seen over the left apex. Pleural-based left apical density likely fluid in the pleural space. Lungs are hypoinflated with continued evidence of a moderate size left effusion/atelectasis. Stable cardiomegaly. Remainder of the exam is unchanged.  IMPRESSION: Stable moderate left pleural fluid collection likely with associated atelectasis. Likely fluid tracking to the left apex.  No definite left pneumothorax seen.  Tubes and lines unchanged.  Stable cardiomegaly.   Electronically Signed   By: Elberta Fortis M.D.   On: 10/26/2013 07:37   Dg Chest Port 1 View  10/25/2013   CLINICAL DATA:  Status post VATS.  EXAM: PORTABLE  CHEST - 1 VIEW  COMPARISON:  10/24/2013  FINDINGS: Right-sided central line tip overlies the level of the superior vena cava.  The heart is enlarged. There is a small left apical pneumothorax, unchanged. Significant left lower lobe parenchymal opacity and pleural effusion again noted. A left-sided chest tube is in place, unchanged in appearance. There is density at the medial right lung base, with some interval improvement in aeration.  IMPRESSION: 1. Cardiomegaly and bibasilar opacities, left greater than right. 2. Some interval improvement in aeration at the right lung base. 3. Stable left apical pneumothorax.   Electronically Signed   By: Rosalie Gums M.D.   On: 10/25/2013 07:40     Assessment/Plan: S/P Procedure(s) (LRB): VIDEO BRONCHOSCOPY (N/A) VIDEO ASSISTED THORACOSCOPY (Left) Mobilize Diuresis transfer to step down Cultures and path still pending D/c one chest tube today    Rachel Dodson 10/26/2013 11:31 AM

## 2013-10-26 NOTE — Progress Notes (Signed)
Patient ID: Rachel Dodson, female   DOB: 20-Apr-1946, 67 y.o.   MRN: [id number]   SICU Evening Rounds:   Hemodynamically stable up in chair  Urine output good  CT output low  CBC    Component Value Date/Time   WBC 18.5* 10/26/2013 0400   RBC 3.62* 10/26/2013 0400   HGB 11.4* 10/26/2013 0400   HCT 34.1* 10/26/2013 0400   PLT 267 10/26/2013 0400   MCV 94.2 10/26/2013 0400   MCH 31.5 10/26/2013 0400   MCHC 33.4 10/26/2013 0400   RDW 13.7 10/26/2013 0400   LYMPHSABS 1.2 10/22/2013 1249   MONOABS 2.2* 10/22/2013 1249   EOSABS 0.0 10/22/2013 1249   BASOSABS 0.0 10/22/2013 1249     BMET    Component Value Date/Time   NA 128* 10/26/2013 0400   K 3.5 10/26/2013 0400   CL 94* 10/26/2013 0400   CO2 26 10/26/2013 0400   GLUCOSE 116* 10/26/2013 0400   BUN 16 10/26/2013 0400   CREATININE 0.81 10/26/2013 0400   CALCIUM 8.0* 10/26/2013 0400   GFRNONAA 73* 10/26/2013 0400   GFRAA 85* 10/26/2013 0400     A/P:  Stable postop course. Awaiting stepdown bed.

## 2013-10-26 NOTE — Evaluation (Signed)
Occupational Therapy Evaluation Patient Details Name: Rachel Dodson MRN: [id number] DOB: 30-Oct-1946 Today's Date: 10/26/2013 Time: 4696-2952 OT Time Calculation (min): 27 min  OT Assessment / Plan / Recommendation History of present illness 67 y/o F admitted on 10/28 with 3d hx of SOB, back pain & dizziness after a fall.  CT Chest found moderate loculate L pleural effusion & pulmonary nodule.  Underwent VATS drainage 10/31.    Clinical Impression   Pt admitted with above diagnosis and has the deficits listed below.  Pt would benefit from OT to increase I with all basic adls so she can eventually d/c home without assist.    OT Assessment  Patient needs continued OT Services    Follow Up Recommendations  CIR    Barriers to Discharge Decreased caregiver support Pt lives alone but feel she could become mod I if she went to rehab.  Equipment Recommendations  3 in 1 bedside comode;Tub/shower seat    Recommendations for Other Services Rehab consult  Frequency  Min 2X/week    Precautions / Restrictions Precautions Precautions: Fall Restrictions Weight Bearing Restrictions: No   Pertinent Vitals/Pain Pt c/o back pain.  Stats all stable.    ADL  Eating/Feeding: Performed;Set up Where Assessed - Eating/Feeding: Chair Grooming: Performed;Teeth care;Brushing hair;Set up Where Assessed - Grooming: Unsupported sitting Upper Body Bathing: Simulated;Set up Where Assessed - Upper Body Bathing: Unsupported sitting Lower Body Bathing: Performed;Moderate assistance Where Assessed - Lower Body Bathing: Supported sit to stand Upper Body Dressing: Simulated;Set up Where Assessed - Upper Body Dressing: Supported sitting Lower Body Dressing: Performed;Moderate assistance Where Assessed - Lower Body Dressing: Supported sit to stand Toilet Transfer: Performed;Minimal Dentist Method: Surveyor, minerals: Materials engineer and  Hygiene: Performed;Supervision/safety Where Assessed - Engineer, mining and Hygiene: Sit on 3-in-1 or toilet Transfers/Ambulation Related to ADLs: Pt transferred in room with min assist to initially get balance.   ADL Comments: Pt does well with adls.  Pt able to access feet w/o AE.  Pt needs assist when standing to let go to pull up pants.    OT Diagnosis: Generalized weakness  OT Problem List: Decreased strength;Decreased activity tolerance;Decreased knowledge of use of DME or AE;Pain OT Treatment Interventions: Self-care/ADL training;DME and/or AE instruction;Therapeutic activities   OT Goals(Current goals can be found in the care plan section) Acute Rehab OT Goals Patient Stated Goal: PT would like to go home w/o assist if possible. OT Goal Formulation: With patient Time For Goal Achievement: 11/09/13 Potential to Achieve Goals: Good ADL Goals Pt Will Perform Grooming: with modified independence;standing Pt Will Perform Lower Body Bathing: with supervision;sit to/from stand Pt Will Perform Lower Body Dressing: with supervision;sit to/from stand Pt Will Perform Tub/Shower Transfer: with supervision;rolling walker;shower seat;ambulating Additional ADL Goal #1: Pt will complete all toileting with S with 3:1 over commode.  Visit Information  Last OT Received On: 10/26/13 Assistance Needed: +1 History of Present Illness: 66 y/o F admitted on 10/28 with 3d hx of SOB, back pain & dizziness after a fall.  CT Chest found moderate loculate L pleural effusion & pulmonary nodule.  Underwent VATS drainage 10/31.        Prior Functioning     Home Living Family/patient expects to be discharged to:: Private residence Living Arrangements: Alone Available Help at Discharge: Friend(s);Available PRN/intermittently Type of Home: House Home Access: Stairs to enter [employer] of Steps: 2 Entrance Stairs-Rails: None Home Layout: One level Home Equipment: None Prior  Function Level of Independence: Independent Communication Communication: No difficulties Dominant Hand: Right         Vision/Perception Vision - History Baseline Vision: No visual deficits Patient Visual Report: No change from baseline   Cognition  Cognition Arousal/Alertness: Awake/alert Behavior During Therapy: WFL for tasks assessed/performed Overall Cognitive Status: Within Functional Limits for tasks assessed    Extremity/Trunk Assessment Upper Extremity Assessment Upper Extremity Assessment: Overall WFL for tasks assessed Lower Extremity Assessment Lower Extremity Assessment: Defer to PT evaluation Cervical / Trunk Assessment Cervical / Trunk Assessment: Normal     Mobility Bed Mobility Bed Mobility: Not assessed Details for Bed Mobility Assistance: Pt up in chair on arrival. Transfers Transfers: Sit to Stand;Stand to Sit Sit to Stand: 4: Min assist;From chair/3-in-1 Stand to Sit: 4: Min assist;To chair/3-in-1 Details for Transfer Assistance: Pt required cues to not hold onto therapist and push up from chair.     Exercise     Balance Balance Balance Assessed: Yes   End of Session OT - End of Session Activity Tolerance: Patient tolerated treatment well Patient left: in chair;with call bell/phone within reach Nurse Communication: Mobility status  GO     Hope Budds 10/26/2013, 10:05 AM [phone]

## 2013-10-26 NOTE — Progress Notes (Signed)
K+= 3.5 and creat= 0.81 w/ urine o/p > 30cc/hr; TCTS KCL protocol initiated with 10 mEq KCL in 50 cc IV x 3, each over one hour.

## 2013-10-26 NOTE — Progress Notes (Signed)
PT Cancellation Note  Patient Details Name: Rachel Dodson MRN: [id number] DOB: Mar 15, 1946   Cancelled Treatment:    Reason Eval/Treat Not Completed: Patient at procedure or test/unavailable (Chest tube being removed)   Van Clines [facility] 10/26/2013, 3:43 PM

## 2013-10-26 NOTE — Progress Notes (Signed)
Rehab Admissions Coordinator Note:  Patient was screened by Trish Mage for appropriateness for an Inpatient Acute Rehab Consult.  Noted PT and OT recommending CIR.  I spoke with [employer] insurance case Production designer, theatre/television/film.  Based on current diagnosis, does not meet insurance criteria for acute inpatient rehab admission.  At this time, we are recommending Skilled Nursing Facility.  Trish Mage 10/26/2013, 11:49 AM  I can be reached at [phone].

## 2013-10-27 ENCOUNTER — Inpatient Hospital Stay (HOSPITAL_COMMUNITY): Payer: Medicare Other

## 2013-10-27 ENCOUNTER — Encounter (HOSPITAL_COMMUNITY): Payer: Self-pay | Admitting: Cardiothoracic Surgery

## 2013-10-27 LAB — MAGNESIUM: Magnesium: 1.7 mg/dL (ref 1.5–2.5)

## 2013-10-27 LAB — BASIC METABOLIC PANEL
BUN: 14 mg/dL (ref 6–23)
CO2: 27 mEq/L (ref 19–32)
CO2: 27 mEq/L (ref 19–32)
Calcium: 8.1 mg/dL — ABNORMAL LOW (ref 8.4–10.5)
Chloride: 95 mEq/L — ABNORMAL LOW (ref 96–112)
Chloride: 92 mEq/L — ABNORMAL LOW (ref 96–112)
Creatinine, Ser: 0.71 mg/dL (ref 0.50–1.10)
Creatinine, Ser: 0.67 mg/dL (ref 0.50–1.10)
GFR calc Af Amer: >90 mL/min (ref >90)
Glucose, Bld: 96 mg/dL (ref 70–99)
Glucose, Bld: 105 mg/dL — ABNORMAL HIGH (ref 70–99)
Potassium: 4 mEq/L (ref 3.5–5.1)
Potassium: 3.7 mEq/L (ref 3.5–5.1)
Sodium: 129 mEq/L — ABNORMAL LOW (ref 135–145)
Sodium: 129 mEq/L — ABNORMAL LOW (ref 135–145)

## 2013-10-27 LAB — CBC
HCT: 32.7 % — ABNORMAL LOW (ref 36.0–46.0)
Hemoglobin: 11.2 g/dL — ABNORMAL LOW (ref 12.0–15.0)
MCH: 32.2 pg (ref 26.0–34.0)
MCHC: 34.3 g/dL (ref 30.0–36.0)
MCV: 94 fL (ref 78.0–100.0)
Platelets: 238 10*3/uL (ref 150–400)
RBC: 3.48 MIL/uL — ABNORMAL LOW (ref 3.87–5.11)
RDW: 13.7 % (ref 11.5–15.5)
WBC: 19.4 10*3/uL — ABNORMAL HIGH (ref 4.0–10.5)

## 2013-10-27 LAB — CULTURE, BLOOD (ROUTINE X 2)
Culture: NO GROWTH
Culture: NO GROWTH

## 2013-10-27 LAB — PHOSPHORUS: Phosphorus: 3.1 mg/dL (ref 2.3–4.6)

## 2013-10-27 MED ORDER — FUROSEMIDE 10 MG/ML IJ SOLN
INTRAMUSCULAR | Status: AC
Start: 2013-10-27 — End: 2013-10-28
  Filled 2013-10-27: qty 8

## 2013-10-27 MED ORDER — FUROSEMIDE 10 MG/ML IJ SOLN
60.0000 mg | Freq: Two times a day (BID) | INTRAMUSCULAR | Status: DC
  Administered 2013-10-27 – 2013-10-30 (×6): 60 mg via INTRAVENOUS
  Filled 2013-10-27 (×7): qty 6

## 2013-10-27 MED ORDER — DEXTROSE 5 % IV SOLN
1.0000 g | INTRAVENOUS | Status: DC
  Administered 2013-10-27 – 2013-10-29 (×3): 1 g via INTRAVENOUS
  Filled 2013-10-27 (×4): qty 10

## 2013-10-27 MED ORDER — MAGNESIUM SULFATE 50 % IJ SOLN
2.0000 g | Freq: Once | INTRAMUSCULAR | Status: AC
  Administered 2013-10-27: 2 g via INTRAVENOUS
  Filled 2013-10-27: qty 4

## 2013-10-27 NOTE — Progress Notes (Signed)
Clinical Social Work Department BRIEF PSYCHOSOCIAL ASSESSMENT 10/27/2013  Patient:  Rachel Dodson, Rachel Dodson     Account Number:  [id number]     Admit date:  10/20/2013  Clinical Social Worker:  Carren Rang  Date/Time:  10/27/2013 03:43 PM  Referred by:  Care Management  Date Referred:  10/27/2013 Referred for  SNF Placement   Other Referral:   Interview type:  Patient Other interview type:    PSYCHOSOCIAL DATA Living Status:  ALONE Admitted from facility:   Level of care:   Primary support name:   Primary support relationship to patient:   Degree of support available:   Good    CURRENT CONCERNS Current Concerns  Post-Acute Placement   Other Concerns:    SOCIAL WORK ASSESSMENT / PLAN Clinical Social Worker received referral for SNF placement at d/c. CSW introduced self and explained reason for visit. CSW explained SNF process to patient. Patient reported she is agreeable for SNF placement, but does not want [facility]. CSW will complete FL2 for MD's signature and will update patient when bed offers are received.   Assessment/plan status:  Psychosocial Support/Ongoing Assessment of Needs Other assessment/ plan:   Information/referral to community resources:   SNF    PATIENT'S/FAMILY'S RESPONSE TO PLAN OF CARE: Patient is agreeable for SNF placement.       Maree Krabbe, MSW, Theresia Majors [phone]

## 2013-10-27 NOTE — Progress Notes (Signed)
Physical Therapy Treatment Patient Details Name: Rachel Dodson MRN: [id number] DOB: July 25, 1946 Today's Date: 10/27/2013 Time: 1029-1109 PT Time Calculation (min): 40 min  PT Assessment / Plan / Recommendation  History of Present Illness 67 y/o F admitted on 10/28 with 3d hx of SOB, back pain & dizziness after a fall.  CT Chest found moderate loculate L pleural effusion & pulmonary nodule.  Underwent VATS drainage 10/31.    PT Comments   Pt is progressing slowly, but well with her gait and mobility.  She continues to be limited by pain and DOE.   I discussed with her d/c therapy options SNF vs CIR and I believe she is leaning more towards SNF before going home alone.    Follow Up Recommendations  SNF;Other (comment) (CIR was not approved by her insurance provider)     Does the patient have the potential to tolerate intense rehabilitation    NA  Barriers to Discharge   None      Equipment Recommendations  Rolling walker with 5" wheels    Recommendations for Other Services   None  Frequency Min 3X/week   Progress towards PT Goals Progress towards PT goals: Progressing toward goals  Plan Discharge plan needs to be updated    Precautions / Restrictions Precautions Precautions: Fall   Pertinent Vitals/Pain See vitals flow sheet.     Mobility  Bed Mobility Bed Mobility: Not assessed (pt seated in recliner chair) Transfers Transfers: Sit to Stand;Stand to Sit Sit to Stand: 4: Min assist;With upper extremity assist;With armrests;From chair/3-in-1 Stand to Sit: 4: Min assist;With upper extremity assist;With armrests;To chair/3-in-1 Details for Transfer Assistance: Pt needs verbal cues for hand placement and safety with WC use.   Ambulation/Gait Ambulation/Gait Assistance: 4: Min assist Ambulation Distance (Feet): 400 Feet Assistive device: Other (Comment) (WC to hold O2 and lines) Ambulation/Gait Assistance Details: Occational min assist for LOB when walking while trying to talk  or say hi to RN staff in hallway.  Verbal cues to stay focused on task at hand and if she wants to speak with someone stop insteada of continuing walking.  Gait Pattern: Step-through pattern;Decreased stride length;Shuffle;Trunk flexed Gait velocity: decreased General Gait Details: 2-3 standing rest breaks due to DOE.  O2 sats stable on 2 L O2 [place]    Exercises General Exercises - Lower Extremity Long Arc Quad: AROM;Both;10 reps;Seated Hip ABduction/ADduction: AROM;Both;10 reps;Seated Hip Flexion/Marching: AROM;Both;10 reps;Seated Toe Raises: AROM;Both;10 reps;Seated Heel Raises: AROM;Both;10 reps;Seated    PT Goals (current goals can now be found in the care plan section) Acute Rehab PT Goals Patient Stated Goal: PT would like to go home w/o assist if possible.  Visit Information  Last PT Received On: 10/27/13 Assistance Needed: +1 History of Present Illness: 67 y/o F admitted on 10/28 with 3d hx of SOB, back pain & dizziness after a fall.  CT Chest found moderate loculate L pleural effusion & pulmonary nodule.  Underwent VATS drainage 10/31.     Subjective Data  Subjective: Pt reports that she is feeling stronger every day.  Friend visitng also reports she looks better.   Patient Stated Goal: PT would like to go home w/o assist if possible.   Cognition  Cognition Arousal/Alertness: Awake/alert Behavior During Therapy: WFL for tasks assessed/performed Overall Cognitive Status: Impaired/Different from baseline Area of Impairment: Problem solving Problem Solving: Slow processing General Comments: pt still seems a little slow to process information       End of Session PT - End of Session  Equipment Utilized During Treatment: Gait belt;Oxygen Activity Tolerance: Patient limited by fatigue;Patient limited by pain Patient left: in chair;with call bell/phone within reach Nurse Communication: Mobility status     Lurena Joiner B. Julliette Frentz, PT, DPT [phone]   10/27/2013, 4:51 PM

## 2013-10-27 NOTE — Progress Notes (Signed)
PULMONARY  / CRITICAL CARE MEDICINE  Name: Rachel Dodson MRN: [id number] DOB: [birth year]    ADMISSION DATE:  10/20/2013 CONSULTATION DATE:  10/23/13   REFERRING MD :  Dr. Tyrone Sage PRIMARY SERVICE: TRH-->PCCM  CHIEF COMPLAINT:  Post-Op Resp Fx  BRIEF PATIENT DESCRIPTION: 67 y/o F admitted on 10/28 with 3d hx of SOB, back pain & dizziness after a fall.  CT Chest found moderate loculate L pleural effusion & pulmonary nodule.  Underwent VATS drainage 10/31.  PCCM consulted for post-op respiratory failure.     SIGNIFICANT EVENTS / STUDIES:  10/28 - Admit post mechanical fall w/ SOB, back pain.  Loculated effusion on CT + pulm nodule 10/31 - VATS with drainage of empyema / decortication   LINES / TUBES: 10-31 OTT>>10/31 10-31 rt [place] cvl>> 10-31 lt rad aline>>out 10-31 lt c t x 2>>1 posterior remains>>>  CULTURES: 10-30 pleural fluid>>  Neg afb  Neg yeast  10-28 uc>>ecoli ss cipro 10-30 bc x 2>>  ANTIBIOTICS: 10-31 cipro>> 11/1  11/2 vanc >> 11/2 imi >>  SUBJECTIVE/OVERNIGHT: Walking in halls well, no fevers  VITAL SIGNS: Temp:  [97.5 F (36.4 C)-98.3 F (36.8 C)] 97.5 F (36.4 C) (11/04 0824) Pulse Rate:  [83-111] 111 (11/04 0800) Resp:  [10-19] 17 (11/04 0800) BP: (102-129)/(46-90) 118/65 mmHg (11/04 0800) SpO2:  [85 %-100 %] 98 % (11/04 0800) Weight:  [65.7 kg (144 lb 13.5 oz)] 65.7 kg (144 lb 13.5 oz) (11/04 0600) HEMODYNAMICS:   VENTILATOR SETTINGS:   INTAKE / OUTPUT: Intake/Output     11/03 0701 - 11/04 0700 11/04 0701 - 11/05 0700   P.O. 1000    I.V. (mL/kg) 340 (5.2) 20 (0.3)   Other     IV Piggyback 650    Total Intake(mL/kg) 1990 (30.3) 20 (0.3)   Urine (mL/kg/hr) 1650 (1)    Chest Tube 20 (0)    Total Output 1670     Net +320 +20          PHYSICAL EXAMINATION: General: no distres in chair HEENT: jvd wnl PULM: diminished left base remains CV: s1/s2 rrt AB: bS+, nontender Ext: cool, trace edema  LABS:  CBC Recent Labs     10/25/13   0345  10/26/13  0400  10/27/13  0405  WBC  19.7*  18.5*  19.4*  HGB  12.0  11.4*  11.2*  HCT  34.4*  34.1*  32.7*  PLT  255  267  238   Coag's No results found for this basename: APTT, INR,  in the last 72 hours BMET Recent Labs     10/25/13  0345  10/26/13  0400  10/27/13  0405  NA  127*  128*  129*  K  3.3*  3.5  4.0  CL  92*  94*  95*  CO2  26  26  27   BUN  17  16  15   CREATININE  0.91  0.81  0.71  GLUCOSE  154*  116*  96   Electrolytes Recent Labs     10/25/13  0345  10/26/13  0400  10/27/13  0405  CALCIUM  7.9*  8.0*  8.1*  MG   --    --   1.7  PHOS   --    --   3.1   Sepsis Markers No results found for this basename: LACTICACIDVEN, PROCALCITON, O2SATVEN,  in the last 72 hours ABG Recent Labs     10/24/13  1015  PHART  7.387  PCO2ART  36.9  PO2ART  57.0*   Liver Enzymes Recent Labs     10/25/13  0345  10/26/13  0400  AST  14  19  ALT  13  16  ALKPHOS  67  97  BILITOT  0.3  0.3  ALBUMIN  2.2*  2.1*   Cardiac Enzymes No results found for this basename: TROPONINI, PROBNP,  in the last 72 hours Glucose No results found for this basename: GLUCAP,  in the last 72 hours  Imaging Dg Chest 2 View  10/27/2013   CLINICAL DATA:  Shortness of breath  EXAM: CHEST  2 VIEW  COMPARISON:  10/26/2013  FINDINGS: There is been interval removal of 1 of the left-sided chest tubes. The 2nd tube remains in stable position. A right-sided central venous line is again noted. The cardiac shadow is stable. Persistent left basilar infiltrate is seen with associated effusion. The right lung remains clear.  IMPRESSION: No evidence of pneumothorax following are left chest tube removal. Persistent left basilar changes are seen.   Electronically Signed   By: Alcide Clever M.D.   On: 10/27/2013 07:30   Dg Chest Port 1 View  10/26/2013   CLINICAL DATA:  VATS.  EXAM: PORTABLE CHEST - 1 VIEW  COMPARISON:  10/25/2013 and 10/24/2013  FINDINGS: Patient is rotated to the right. Right  subclavian central venous catheter is unchanged. 2 left-sided chest tubes are unchanged. No definite pleural line seen over the left apex. Pleural-based left apical density likely fluid in the pleural space. Lungs are hypoinflated with continued evidence of a moderate size left effusion/atelectasis. Stable cardiomegaly. Remainder of the exam is unchanged.  IMPRESSION: Stable moderate left pleural fluid collection likely with associated atelectasis. Likely fluid tracking to the left apex.  No definite left pneumothorax seen.  Tubes and lines unchanged.  Stable cardiomegaly.   Electronically Signed   By: Elberta Fortis M.D.   On: 10/26/2013 07:37     CXR: see above  ASSESSMENT / PLAN:  PULMONARY A:VDRF post vats 10-31 > resolved    Vats for empyema  P:   Pulm hygiene  Mobilize, she is ambulating in halls Neg balance goals pcxr in am , remains with atx but clinically doing well One chest tube remains, per cvts  CARDIOVASCULAR A: Hypotension/sepsis from presumed empyema - resolved       CHF P:  KVO fluids  Lasix consider increase   RENAL A: Mild renal insuff base .9 - improved  Hyponatremia > likely siadh, dilutional Hypomag Continue pos balance slight 300 cc P:   F/u chem in am Lasix increase to neg balance goal 500 cc Replace Mag  GASTROINTESTINAL A:  No active issue  P:   PPI diet  HEMATOLOGIC A:  Mild leukocytosis P:  Lovenox, follow daily crt, she is ambulating well, consider dc  INFECTIOUS A:  PostVATS for empyema> cultures negative P:   Vanc/Imi narrow off as remains culture neg Add ceftriaxone (unilateral, from home)  NEUROLOGIC A: Post op pain  P:   Pain rx per CVTS  ambulating  Increase lasix to avoid daily pos balance, narrow abx, consider dc lovenox as ambulating Replace mag  Kizzie Bane PCCM Pager: [phone] Cell: [phone] If no response, call [phone]

## 2013-10-27 NOTE — Progress Notes (Signed)
Chaplain responded to spiritual care consult by offering spiritual/emotional care to pt. Pt was sitting up after a walk around the unit and being visited by a friend. Pt said she is "anxiously waiting" and "frustrated" by the wait for pathology results. She and her friend said they were specifically concerned about "cancer." Pt's PA checked on pt while chaplain was present, calming some anxiety and providing encouragement for the wait. Pt requested prayer from the chaplain for "test results, strength, health, and faith."   Chaplain provided emotional and spiritual support, empathic listening, presence, and prayer. Pt and friend said they appreciated chaplain support.   Maurene Capes [phone]

## 2013-10-27 NOTE — Progress Notes (Signed)
Clinical Social Work Department CLINICAL SOCIAL WORK PLACEMENT NOTE 10/27/2013  Patient:  Rachel Dodson, Rachel Dodson  Account Number:  [id number] Admit date:  10/20/2013  Clinical Social Worker:  Carren Rang  Date/time:  10/27/2013 03:55 PM  Clinical Social Work is seeking post-discharge placement for this patient at the following level of care:   SKILLED NURSING   (*CSW will update this form in Epic as items are completed)   10/27/2013  Patient/family provided with [facility] Department of Clinical Social Work's list of facilities offering this level of care within the geographic area requested by the patient (or if unable, by the patient's family).  10/27/2013  Patient/family informed of their freedom to choose among providers that offer the needed level of care, that participate in Medicare, Medicaid or managed care program needed by the patient, have an available bed and are willing to accept the patient.  10/27/2013  Patient/family informed of [facility]' ownership interest in [facility], as well as of the fact that they are under no obligation to receive care at this facility.  PASARR submitted to EDS on 10/27/2013 PASARR number received from EDS on   FL2 transmitted to all facilities in geographic area requested by pt/family on  10/27/2013 FL2 transmitted to all facilities within larger geographic area on   Patient informed that his/her managed care company has contracts with or will negotiate with  certain facilities, including the following:     Patient/family informed of bed offers received:   Patient chooses bed at  Physician recommends and patient chooses bed at    Patient to be transferred to  on   Patient to be transferred to facility by   The following physician request were entered in Epic:   Additional Comments:  Maree Krabbe, MSW, [employer] [phone]

## 2013-10-27 NOTE — Progress Notes (Addendum)
      [address]       [place] [address]             [phone]       4 Days Post-Op Procedure(s) (LRB): VIDEO BRONCHOSCOPY (N/A) VIDEO ASSISTED THORACOSCOPY (Left)  Subjective: Patient sitting in chair without complaints.  Objective: Vital signs in last 24 hours: Temp:  [97.5 F (36.4 C)-98.3 F (36.8 C)] 97.5 F (36.4 C) (11/04 0824) Pulse Rate:  [83-111] 111 (11/04 0800) Cardiac Rhythm:  [-] Sinus tachycardia (11/04 0800) Resp:  [10-19] 17 (11/04 0800) BP: (102-129)/(46-90) 118/65 mmHg (11/04 0800) SpO2:  [85 %-98 %] 98 % (11/04 0800) Weight:  [65.7 kg (144 lb 13.5 oz)] 65.7 kg (144 lb 13.5 oz) (11/04 0600)      Intake/Output from previous day: 11/03 0701 - 11/04 0700 In: 1990 [P.O.:1000; I.V.:340; IV Piggyback:650] Out: 1670 [Urine:1650; Chest Tube:20]   Physical Exam:  Cardiovascular: Slightly tachy Pulmonary: Clear to auscultation on the right and slightly diminished at left base;no rales, wheezes, or rhonchi. Abdomen: Soft, non tender, bowel sounds present. Extremities: Mild bilateral lower extremity edema. Wounds: Clean and dry.  No erythema or signs of infection. Chest Tube: to water seal and no air leak  Lab Results: CBC: Recent Labs  10/26/13 0400 10/27/13 0405  WBC 18.5* 19.4*  HGB 11.4* 11.2*  HCT 34.1* 32.7*  PLT 267 238   BMET:  Recent Labs  10/26/13 0400 10/27/13 0405  NA 128* 129*  K 3.5 4.0  CL 94* 95*  CO2 26 27  GLUCOSE 116* 96  BUN 16 15  CREATININE 0.81 0.71  CALCIUM 8.0* 8.1*    PT/INR: No results found for this basename: LABPROT, INR,  in the last 72 hours ABG:  INR: Will add last result for INR, ABG once components are confirmed Will add last 4 CBG results once components are confirmed  Assessment/Plan:  1. CV - SR/ST. On Lopressor 12.5 bid. 2.  Pulmonary - Chest tube with 20 cc of output last 24 hours. There is no air leak. CXR this am shows no pneumothorax, small left pleural  effusion/opacity.Chest tube placed to water seal earlier this am. S/p drainage of empyema/decortication. On Rocephin. Hope to remove remaining chest tube soon, possible in the am.Pathology showed MARKEDLY INFLAMED SOFT TISSUE WITH ABSCESS AND INFLAMMATORY FIBRINOID DEBRIS. - NEGATIVE FOR MALIGNANCY. On 2 liters of oxygen via [place]-wean as tolerates. Encourage incentive spirometer and flutter valve. 3. Management per pulmonary/CCM. OK to transfer to stepdown.  ZIMMERMAN,DONIELLE MPA-C 10/27/2013,11:14 AM I have seen and examined Rachel Dodson and agree with the above assessment  and plan.  Delight Ovens MD Beeper [phone] Office [phone]

## 2013-10-27 NOTE — Progress Notes (Signed)
Report received from Lyn Hollingshead , RN on 2300. Awaiting patient arrival

## 2013-10-27 NOTE — Op Note (Signed)
[name], Rachel Dodson                ACCOUNT NO.:  [id number]  MEDICAL RECORD NO.:  [id number]  LOCATION:  2S14C                        FACILITY:  [facility]  PHYSICIAN:  Sheliah Plane, MD    DATE OF BIRTH:  1946/06/27  DATE OF PROCEDURE:  10/23/2013 DATE OF DISCHARGE:                              OPERATIVE REPORT   PREOPERATIVE DIAGNOSIS:  Complex large left pleural effusion, probable empyema.  POSTOPERATIVE DIAGNOSIS:  Complex large left pleural effusion, probable empyema.  SURGICAL PROCEDURE:  Bronchoscopy, left video-assisted thoracoscopy, mini thoracotomy, decortication of the left lung and drainage of empyema.  SURGEON:  Sheliah Plane, MD  FIRST ASSISTANT:  Kristin Bruins, PA-C  BRIEF HISTORY:  The patient is a 67 year old female, who has been feeling poorly for several weeks  __________ EMS taken to the [facility] Emergency Room, had large complex effusion and question of bilateral Effusion was seen on chest x-ray.  CT scan showed a large complex left pleural effusion.  Attempted thoracentesis was unsuccessful and in fact diffusion appeared to get worse.  The patient was transferred to [facility] for further care.  Her white count was 30,000 though she was afebrile.  Bronchoscopy, left video-assisted thoracoscopy, and drainage and mini thoracotomy was recommended to the patient to drain the effusion and for diagnostic purposes also.  She agreed and signed informed consent.  DESCRIPTION OF PROCEDURE:  The patient underwent general endotracheal anesthesia without incident.  Appropriate time-out was performed. Through the single-lumen endotracheal tube, a fiberoptic bronchoscopy was performed to the subsegment bilaterally.  Bronchial washings were sent for culture from the left lower lobe.  The scope was then removed. The patient was turned in lateral decubitus position with the left side up.  A small port incision was made approximately at fourth intercostal space  midaxillary line.  A pleural space could be entered and it was obvious there was extensive loculations throughout the left chest with pockets of purulent material and complex effusion.  Aerobic and anaerobic cultures were obtained.  Chemical studies on the pleural fluid were obtained to adequately decorticate the lung which had a moderate amount of peel.  A small incision was made with the scope and through this small incision, the extensive loculated adhesions were broken up. The lungs were decorticated.  Pathology was sent on the material removed.  Two Blake drains were placed through separate sites in the chest wall into the pleural space.  With adequate drainage, the left lung was reinflated.  Two pericostal sutures were placed.  The muscle layer was closed with interrupted 0 Vicryl, subcutaneous tissue in a running 2-0 Vicryl, 3-0 subcuticular stitch in the skin edge.  Dry dressings were applied and the patient was attempted to be extubated in the operating room; however, her tidal volumes were low and she was transferred to surgical intensive care unit on a ventilator for further postoperative care.  Sponge and needle count was reported as correct. Estimated blood loss approximately 200 mL.     Sheliah Plane, MD     EG/MEDQ  D:  10/26/2013  T:  10/27/2013  Job:  [id number]

## 2013-10-27 NOTE — Care Management Note (Unsigned)
    Page 1 of 2   10/30/2013     3:44:42 PM   CARE MANAGEMENT NOTE 10/30/2013  Patient:  Rachel Dodson, Rachel Dodson   Account Number:  [id number]  Date Initiated:  10/23/2013  Documentation initiated by:  [facility]  Subjective/Objective Assessment:   Admitted to [facility] for CP - dizziness - SOB.  Found to have loculated pleural effusion.  10-31 - post op VATS at [facility].     Action/Plan:   Anticipated DC Date:  11/02/2013   Anticipated DC Plan:  SKILLED NURSING FACILITY  In-house referral  Clinical Social Worker      DC Planning Services  CM consult      Choice offered to / List presented to:             Status of service:  In process, will continue to follow Medicare Important Message given?   (If response is "NO", the following Medicare IM given date fields will be blank) Date Medicare IM given:   Date Additional Medicare IM given:    Discharge Disposition:    Per UR Regulation:  Reviewed for med. necessity/level of care/duration of stay  If discussed at Long Length of Stay Meetings, dates discussed:   10/27/2013  10/29/2013    Comments:  Contact:  Pierce,David Relative [phone]  10/30/13 Januel Doolan,RN,BSN [phone] PT HAS SNF BED AVAILABLE AT [facility] WHEN MEDICALLY STABLE.  WILL FOLLOW PROGRESS.  10-27-13 1:40pm Avie Arenas, RNBSN [phone] Patient does live at home alone.  Does have dogs at home and friends around.  CIR declined to insurance did not appove.  Patient ok with ST rehab at SNF.  She lives in [place] - friend lives in [place].  Informed they would have options on where she could go once beds were offered.  SW consult already in - SW updated.

## 2013-10-28 ENCOUNTER — Inpatient Hospital Stay (HOSPITAL_COMMUNITY): Payer: Medicare Other

## 2013-10-28 LAB — CBC
HCT: 33.9 % — ABNORMAL LOW (ref 36.0–46.0)
Hemoglobin: 11.4 g/dL — ABNORMAL LOW (ref 12.0–15.0)
MCH: 31.6 pg (ref 26.0–34.0)
MCHC: 33.6 g/dL (ref 30.0–36.0)
MCV: 93.9 fL (ref 78.0–100.0)
RBC: 3.61 MIL/uL — ABNORMAL LOW (ref 3.87–5.11)

## 2013-10-28 LAB — ANAEROBIC CULTURE: Gram Stain: NONE SEEN

## 2013-10-28 LAB — BASIC METABOLIC PANEL
BUN: 14 mg/dL (ref 6–23)
CO2: 25 mEq/L (ref 19–32)
Calcium: 8.5 mg/dL (ref 8.4–10.5)
Creatinine, Ser: 0.71 mg/dL (ref 0.50–1.10)
GFR calc non Af Amer: 87 mL/min — ABNORMAL LOW (ref >90)
Glucose, Bld: 113 mg/dL — ABNORMAL HIGH (ref 70–99)
Potassium: 4 mEq/L (ref 3.5–5.1)
Sodium: 128 mEq/L — ABNORMAL LOW (ref 135–145)

## 2013-10-28 LAB — MAGNESIUM: Magnesium: 2 mg/dL (ref 1.5–2.5)

## 2013-10-28 NOTE — Progress Notes (Signed)
PULMONARY  / CRITICAL CARE MEDICINE  Name: Rachel Dodson MRN: [id number] DOB: 04/08/1946    ADMISSION DATE:  10/20/2013 CONSULTATION DATE:  10/23/13   REFERRING MD :  Dr. Tyrone Sage PRIMARY SERVICE: [facility]-->PCCM  CHIEF COMPLAINT:  Post-Op Resp Fx  BRIEF PATIENT DESCRIPTION: 67 y/o F admitted on 10/28 with 3d hx of SOB, back pain & dizziness after a fall.  CT Chest found moderate loculate L pleural effusion & pulmonary nodule.  Underwent VATS drainage 10/31.  PCCM consulted for post-op respiratory failure.     SIGNIFICANT EVENTS / STUDIES:  10/28 - Admit post mechanical fall w/ SOB, back pain.  Loculated effusion on CT + pulm nodule 10/31 - VATS with drainage of empyema / decortication   LINES / TUBES: 10-31 OTT>>10/31 10-31 rt [place] cvl>> 10-31 lt rad aline>>out 10-31 lt c t x 2>>1 posterior remains>>>  CULTURES: 10-30 pleural fluid>>  Neg afb  Neg yeast  10-28 uc>>ecoli ss cipro 10-30 bc x 2>>not done  ANTIBIOTICS: 10-31 cipro>> 11/1  11/2 vanc >>11-4 11/2 imi >>11-4 11-4 roc>> SUBJECTIVE/OVERNIGHT: NAD, chest tube remains an on o2  VITAL SIGNS: Temp:  [97 F (36.1 C)-98.2 F (36.8 C)] 98.2 F (36.8 C) (11/05 0340) Pulse Rate:  [44-112] 99 (11/05 0340) Resp:  [14-18] 18 (11/05 0340) BP: (118-126)/(54-67) 126/59 mmHg (11/05 0340) SpO2:  [96 %-100 %] 96 % (11/05 0340) HEMODYNAMICS:   VENTILATOR SETTINGS:   INTAKE / OUTPUT: Intake/Output     11/04 0701 - 11/05 0700 11/05 0701 - 11/06 0700   P.O. 990    I.V. (mL/kg) 203.5 (3.1)    IV Piggyback 50    Total Intake(mL/kg) 1243.5 (18.9)    Urine (mL/kg/hr) 2100 (1.3) 400 (1.4)   Chest Tube 200 (0.1)    Total Output 2300 400   Net -1056.5 -400        Urine Occurrence 1 x      PHYSICAL EXAMINATION: General: no distress in bed HEENT: jvd wnl PULM: diminished left base remains, left ct to water seal , no air leak CV: s1/s2 rrt AB: bS+, nontender Ext: cool, trace edema  LABS:  CBC Recent Labs      10/26/13  0400  10/27/13  0405  10/28/13  0425  WBC  18.5*  19.4*  19.2*  HGB  11.4*  11.2*  11.4*  HCT  34.1*  32.7*  33.9*  PLT  267  238  253   Coag's No results found for this basename: APTT, INR,  in the last 72 hours BMET Recent Labs     10/27/13  0405  10/27/13  1735  10/28/13  0425  NA  129*  129*  128*  K  4.0  3.7  4.0  CL  95*  92*  93*  CO2  27  27  25   BUN  15  14  14   CREATININE  0.71  0.67  0.71  GLUCOSE  96  105*  113*   Electrolytes Recent Labs     10/27/13  0405  10/27/13  1735  10/28/13  0425  CALCIUM  8.1*  8.3*  8.5  MG  1.7   --   2.0  PHOS  3.1   --   3.3   Sepsis Markers No results found for this basename: LACTICACIDVEN, PROCALCITON, O2SATVEN,  in the last 72 hours ABG No results found for this basename: PHART, PCO2ART, PO2ART,  in the last 72 hours Liver Enzymes Recent Labs  10/26/13  0400  AST  19  ALT  16  ALKPHOS  97  BILITOT  0.3  ALBUMIN  2.1*   Cardiac Enzymes No results found for this basename: TROPONINI, PROBNP,  in the last 72 hours Glucose No results found for this basename: GLUCAP,  in the last 72 hours  Imaging Dg Chest 2 View  10/27/2013   CLINICAL DATA:  Shortness of breath  EXAM: CHEST  2 VIEW  COMPARISON:  10/26/2013  FINDINGS: There is been interval removal of 1 of the left-sided chest tubes. The 2nd tube remains in stable position. A right-sided central venous line is again noted. The cardiac shadow is stable. Persistent left basilar infiltrate is seen with associated effusion. The right lung remains clear.  IMPRESSION: No evidence of pneumothorax following are left chest tube removal. Persistent left basilar changes are seen.   Electronically Signed   By: Alcide Clever M.D.   On: 10/27/2013 07:30   Dg Chest Port 1 View  10/28/2013   CLINICAL DATA:  Difficulty breathing; recent pneumothorax  EXAM: PORTABLE CHEST - 1 VIEW  COMPARISON:  October 27, 2013 chest radiograph and chest CT October 20, 2013  FINDINGS: The  chest tube position on the left is stable. Central catheter tip is in the superior vena cava. There is the currently a minimal left apical pneumothorax.  There is extensive consolidation throughout the left lower lobe. Right lung is clear. 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 Heart is enlarged with normal pulmonary vascularity.  IMPRESSION: Minimal left apical pneumothorax. Persistent extensive left lower lobe consolidation. Stable cardiac enlargement. Sizable paraesophageal type hernia.   Electronically Signed   By: Bretta Bang M.D.   On: 10/28/2013 08:00     CXR: see above  ASSESSMENT / PLAN:  PULMONARY A:VDRF post vats 10-31 > resolved    Vats for empyema  P:   Pulm hygiene  Mobilize, she is ambulating in halls Neg balance goalsam , remains with atx but clinically doing well One chest tube remains, per cvts, no air leak, likely to dc in am   CARDIOVASCULAR A: Hypotension/sepsis from presumed empyema - resolved       CHF P:  KVO fluids  Lasix  Increased 11-4, had neg 1 liter, maintain, has tolerated well Chem in am   RENAL Lab Results  Component Value Date   CREATININE 0.71 10/28/2013   CREATININE 0.67 10/27/2013   CREATININE 0.71 10/27/2013    Recent Labs Lab 10/27/13 0405 10/27/13 1735 10/28/13 0425  NA 129* 129* 128*    Intake/Output Summary (Last 24 hours) at 10/28/13 1130 Last data filed at 10/28/13 0842  Gross per 24 hour  Intake  613.5 ml  Output   2150 ml  Net -1536.5 ml   A: Mild renal insuff base .9 - improved  Hyponatremia > likely siadh, dilutional P:   F/u chem in am  Lasix continue  GASTROINTESTINAL A:  No active issue  P:   PPI diet  HEMATOLOGIC A:  Mild leukocytosis P:  Lovenox, she is ambulating well, consider dc  INFECTIOUS A:  PostVATS for empyema> cultures negative P:   Ceftriaxone, see stop date  NEUROLOGIC A: Post op pain  P:   Pain rx per CVTS  Ambulating  Brett Canales Minor ACNP [facility] PCCM Pager [phone]  till 3 pm If no answer page [phone] 10/28/2013, 11:29 AM  I have fully examined this patient and agree with above findings.    And edited infull  Reuel Boom  J. Titus Mould, MD, [place] Pgr: [facility]

## 2013-10-28 NOTE — Progress Notes (Signed)
CSW provided bed offers to patient. Patient states that she wants to go to [facility]. CSW spoke to [facility] who states they can provide a bed for patient when patient is medically ready. FL2 on chart for MD signature.  Maree Krabbe, MSW, [facility] [phone]

## 2013-10-28 NOTE — Progress Notes (Signed)
Occupational Therapy Treatment Patient Details Name: Rachel Dodson MRN: [id number] DOB: 03-20-1946 Today's Date: 10/28/2013 Time: 4098-1191 OT Time Calculation (min): 29 min  OT Assessment / Plan / Recommendation  History of present illness 67 y/o F admitted on 10/28 with 3d hx of SOB, back pain & dizziness after a fall.  CT Chest found moderate loculate L pleural effusion & pulmonary nodule.  Underwent VATS drainage 10/31.    OT comments  Pt with improving independence with BADLs.  Pt. Able to perform BADLs with 3 rests with sats 89-94% on RA; HR 108.    Follow Up Recommendations  SNF    Barriers to Discharge       Equipment Recommendations  3 in 1 bedside comode;Tub/shower seat    Recommendations for Other Services    Frequency Min 2X/week   Progress towards OT Goals Progress towards OT goals: Progressing toward goals  Plan Discharge plan needs to be updated    Precautions / Restrictions Precautions Precautions: Fall   Pertinent Vitals/Pain     ADL  Grooming: Wash/dry hands;Teeth care;Supervision/safety Where Assessed - Grooming: Unsupported standing Lower Body Dressing: Min guard Where Assessed - Lower Body Dressing: Supported sit to Pharmacist, hospital: Minimal assistance Statistician Method: Surveyor, minerals: Regular height toilet;Comfort height toilet;Grab bars Toileting - Architect and Hygiene: Min guard Where Assessed - Engineer, mining and Hygiene: Sit to stand from 3-in-1 or toilet Equipment Used: Rolling walker Transfers/Ambulation Related to ADLs: min guard to min a with ambulation with RW ADL Comments: Pt able to perform above ADL tasks with 3 rest breaks and 02 sats 89-95% on RA    OT Diagnosis:    OT Problem List:   OT Treatment Interventions:     OT Goals(current goals can now be found in the care plan section) Acute Rehab OT Goals Patient Stated Goal: PT would like to go home w/o assist if  possible. OT Goal Formulation: With patient Time For Goal Achievement: 11/09/13 Potential to Achieve Goals: Good ADL Goals Pt Will Perform Grooming: with modified independence;standing Pt Will Perform Lower Body Bathing: with supervision;sit to/from stand Pt Will Perform Lower Body Dressing: with supervision;sit to/from stand Pt Will Perform Tub/Shower Transfer: with supervision;rolling walker;shower seat;ambulating Additional ADL Goal #1: Pt will complete all toileting with S with 3:1 over commode.  Visit Information  Last OT Received On: 10/28/13 Assistance Needed: +1 History of Present Illness: 67 y/o F admitted on 10/28 with 3d hx of SOB, back pain & dizziness after a fall.  CT Chest found moderate loculate L pleural effusion & pulmonary nodule.  Underwent VATS drainage 10/31.     Subjective Data      Prior Functioning       Cognition  Cognition Arousal/Alertness: Awake/alert Behavior During Therapy: Flat affect Overall Cognitive Status: Impaired/Different from baseline Area of Impairment: Memory Memory: Decreased short-term memory Problem Solving: Slow processing    Mobility  Bed Mobility Bed Mobility: Rolling Right;Right Sidelying to Sit;Sit to Sidelying Right;Sitting - Scoot to Edge of Bed Rolling Right: 5: Supervision Right Sidelying to Sit: 5: Supervision Sitting - Scoot to Edge of Bed: 5: Supervision Sit to Sidelying Right: 5: Supervision Details for Bed Mobility Assistance: increased time Transfers Transfers: Sit to Stand;Stand to Sit Sit to Stand: With upper extremity assist;From bed;4: Min guard;From chair/3-in-1 Stand to Sit: 4: Min guard;With upper extremity assist;To bed;To chair/3-in-1 Details for Transfer Assistance: min guard assist for safety     Exercises  Balance Balance Balance Assessed: Yes Dynamic Standing Balance Dynamic Standing - Balance Support: Left upper extremity supported Dynamic Standing - Level of Assistance: 5: Stand by  assistance Dynamic Standing - Balance Activities: Other (comment) (grooming at sink)   End of Session OT - End of Session Equipment Utilized During Treatment: Rolling walker Activity Tolerance: Patient tolerated treatment well Patient left: in bed;with call bell/phone within reach;with bed alarm set Nurse Communication: Mobility status  GO     Azayla Polo M 10/28/2013, 3:26 PM

## 2013-10-28 NOTE — Progress Notes (Addendum)
      [address]       [place] [address]             [phone]      5 Days Post-Op Procedure(s) (LRB): VIDEO BRONCHOSCOPY (N/A) VIDEO ASSISTED THORACOSCOPY (Left)  Subjective:  Mrs. Huertas states she is doing okay this morning.  Her pain is under control.  Objective: Vital signs in last 24 hours: Temp:  [97 F (36.1 C)-98.2 F (36.8 C)] 98.2 F (36.8 C) (11/05 0340) Pulse Rate:  [44-112] 99 (11/05 0340) Cardiac Rhythm:  [-] Normal sinus rhythm (11/04 2045) Resp:  [12-18] 18 (11/05 0340) BP: (118-126)/(54-67) 126/59 mmHg (11/05 0340) SpO2:  [92 %-100 %] 96 % (11/05 0340)  Intake/Output from previous day: 11/04 0701 - 11/05 0700 In: 1243.5 [P.O.:990; I.V.:203.5; IV Piggyback:50] Out: 2300 [Urine:2100; Chest Tube:200]  General appearance: alert, cooperative and no distress Heart: regular rate and rhythm Lungs: CTA on right, slightly diminshed on left Abdomen: soft, non-tender; bowel sounds normal; no masses,  no organomegaly Extremities: extremities normal, atraumatic, no cyanosis or edema Wound: clean and dry  Lab Results:  Recent Labs  10/27/13 0405 10/28/13 0425  WBC 19.4* 19.2*  HGB 11.2* 11.4*  HCT 32.7* 33.9*  PLT 238 253   BMET:  Recent Labs  10/27/13 1735 10/28/13 0425  NA 129* 128*  K 3.7 4.0  CL 92* 93*  CO2 27 25  GLUCOSE 105* 113*  BUN 14 14  CREATININE 0.67 0.71  CALCIUM 8.3* 8.5    PT/INR: No results found for this basename: LABPROT, INR,  in the last 72 hours ABG    Component Value Date/Time   PHART 7.387 10/24/2013 1015   HCO3 22.2 10/24/2013 1015   TCO2 23 10/24/2013 1015   ACIDBASEDEF 2.0 10/24/2013 1015   O2SAT 89.0 10/24/2013 1015   CBG (last 3)  No results found for this basename: GLUCAP,  in the last 72 hours  Assessment/Plan: S/P Procedure(s) (LRB): VIDEO BRONCHOSCOPY (N/A) VIDEO ASSISTED THORACOSCOPY (Left)  1. CV- NSR on Lopressor 2. + Empyema- Chest tube in place, 200 cc output yesterday, on  water seal no air leak, leave in place for now 3. Care per primary   LOS: 8 days    Lowella Dandy 10/28/2013  Leave ct in one more day Patient has no care at home, likely will need SNF Path results reviewed with patient I have seen and examined Krystal Eaton and agree with the above assessment  and plan.  Delight Ovens MD Beeper [phone] Office [phone] 10/28/2013 9:32 AM

## 2013-10-29 ENCOUNTER — Inpatient Hospital Stay (HOSPITAL_COMMUNITY): Payer: Medicare Other

## 2013-10-29 LAB — BASIC METABOLIC PANEL
BUN: 11 mg/dL (ref 6–23)
CO2: 30 mEq/L (ref 19–32)
Calcium: 8.5 mg/dL (ref 8.4–10.5)
Creatinine, Ser: 0.65 mg/dL (ref 0.50–1.10)
GFR calc non Af Amer: 89 mL/min — ABNORMAL LOW (ref >90)
Glucose, Bld: 101 mg/dL — ABNORMAL HIGH (ref 70–99)
Sodium: 133 mEq/L — ABNORMAL LOW (ref 135–145)

## 2013-10-29 LAB — MAGNESIUM: Magnesium: 1.7 mg/dL (ref 1.5–2.5)

## 2013-10-29 MED ORDER — MAGNESIUM SULFATE 50 % IJ SOLN: 2.0000 g | Freq: Once | INTRAVENOUS | Status: DC

## 2013-10-29 MED ORDER — MIDAZOLAM HCL 2 MG/2ML IJ SOLN: 4.0000 mg | Freq: Once | INTRAMUSCULAR | Status: DC

## 2013-10-29 MED ORDER — ETOMIDATE 2 MG/ML IV SOLN
40.0000 mg | Freq: Once | INTRAVENOUS | Status: DC
  Filled 2013-10-29: qty 20

## 2013-10-29 MED ORDER — PROPOFOL 10 MG/ML IV EMUL
5.0000 ug/kg/min | Freq: Once | INTRAVENOUS | Status: DC
  Filled 2013-10-29: qty 100

## 2013-10-29 MED ORDER — FENTANYL CITRATE 0.05 MG/ML IJ SOLN: 200.0000 ug | Freq: Once | INTRAMUSCULAR | Status: DC

## 2013-10-29 MED ORDER — POTASSIUM CHLORIDE CRYS ER 20 MEQ PO TBCR
40.0000 meq | EXTENDED_RELEASE_TABLET | Freq: Once | ORAL | Status: AC
  Administered 2013-10-29: 40 meq via ORAL
  Filled 2013-10-29: qty 2

## 2013-10-29 MED ORDER — MAGNESIUM SULFATE 40 MG/ML IJ SOLN
2.0000 g | Freq: Once | INTRAMUSCULAR | Status: AC
  Administered 2013-10-29: 2 g via INTRAVENOUS
  Filled 2013-10-29: qty 50

## 2013-10-29 MED ORDER — VECURONIUM BROMIDE 10 MG IV SOLR
10.0000 mg | Freq: Once | INTRAVENOUS | Status: DC
  Filled 2013-10-29: qty 10

## 2013-10-29 NOTE — Progress Notes (Signed)
Physical Therapy Treatment Patient Details Name: Rachel Dodson MRN: [id number] DOB: September 09, 1946 Today's Date: 10/29/2013 Time: 4098-1191 PT Time Calculation (min): 26 min  PT Assessment / Plan / Recommendation  History of Present Illness 67 y/o F admitted on 10/28 with 3d hx of SOB, back pain & dizziness after a fall.  CT Chest found moderate loculate L pleural effusion & pulmonary nodule.  Underwent VATS drainage 10/31.    PT Comments   Pt continues to have pain, DOE, O2 dependent, generalized weakness and needs assist to walk.  We discussed d/c home or d/c to SNF for rehab today and she does not feel [place] enough to take care of herself yet.  I agree with SNF for rehab before d/c home alone.    Follow Up Recommendations  SNF     Does the patient have the potential to tolerate intense rehabilitation    NA  Barriers to Discharge    Decreased caregiver support.        Equipment Recommendations  Rolling walker with 5" wheels    Recommendations for Other Services   None  Frequency Min 2X/week   Progress towards PT Goals Progress towards PT goals: Progressing toward goals  Plan Frequency needs to be updated    Precautions / Restrictions Precautions Precautions: Fall   Pertinent Vitals/Pain VSS on 2 L O2 [place], DOE 2/4 with gait.     Mobility  Bed Mobility Bed Mobility: Not assessed (pt is seated on 3-in-1 with RN) Transfers Sit to Stand: 5: Supervision;With upper extremity assist;With armrests;From chair/3-in-1;From bed Stand to Sit: 5: Supervision;With upper extremity assist;With armrests;To bed;To chair/3-in-1 Details for Transfer Assistance: supervision for safety, cues for hand placement, and uncontrolled "plop" descent to sit in lower recliner chair, even with upper extremities assisting to lower.  Ambulation/Gait Ambulation/Gait Assistance: 4: Min guard Ambulation Distance (Feet): 100 Feet Assistive device: Rolling walker Ambulation/Gait Assistance Details: Min guard  assist for balance and safety as pt fatiguing quickly today.   Gait Pattern: Step-through pattern;Decreased stride length;Shuffle;Trunk flexed Gait velocity: decreased General Gait Details: one standing rest break due to DOE.      Exercises General Exercises - Lower Extremity Long Arc Quad: AROM;Both;10 reps;Seated Hip ABduction/ADduction: AROM;Both;10 reps;Seated Hip Flexion/Marching: AROM;Both;10 reps;Seated Toe Raises: AROM;Both;10 reps;Seated Heel Raises: AROM;Both;10 reps;Seated Other Exercises Other Exercises: IS x 5 reps with cues for slower, deeper breaths.  Pt was able to get to 750 mL max inspired volume.       PT Goals (current goals can now be found in the care plan section) Acute Rehab PT Goals Patient Stated Goal: Pt would like to go home w/o assist if possible.  Visit Information  Last PT Received On: 10/29/13 Assistance Needed: +1 History of Present Illness: 67 y/o F admitted on 10/28 with 3d hx of SOB, back pain & dizziness after a fall.  CT Chest found moderate loculate L pleural effusion & pulmonary nodule.  Underwent VATS drainage 10/31.     Subjective Data  Subjective: Pt reports chest tube was removed ~ 1 hour ago.  Patient Stated Goal: Pt would like to go home w/o assist if possible.   Cognition  Cognition Arousal/Alertness: Awake/alert Behavior During Therapy: Flat affect Overall Cognitive Status: Impaired/Different from baseline Area of Impairment: Memory;Problem solving Memory: Decreased short-term memory Problem Solving: Slow processing General Comments: Pt continues to be slow to process and respond to questions.  I am not sure of her baseline and there is no one present at this time to  report if this is her usual state of speaking and appearance.         End of Session PT - End of Session Equipment Utilized During Treatment: Oxygen Activity Tolerance: Patient limited by fatigue;Patient limited by pain Patient left: in chair;with call bell/phone  within reach Nurse Communication: Mobility status     Rachel Dodson, PT, DPT [phone]   10/29/2013, 12:49 PM

## 2013-10-29 NOTE — Progress Notes (Addendum)
      [address]       [place] [address]             [phone]       6 Days Post-Op Procedure(s) (LRB): VIDEO BRONCHOSCOPY (N/A) VIDEO ASSISTED THORACOSCOPY (Left)  Subjective: Patient states her breathing is "pretty good". She does have a productive cough.  Objective: Vital signs in last 24 hours: Temp:  [98.3 F (36.8 C)-98.5 F (36.9 C)] 98.5 F (36.9 C) (11/06 0448) Pulse Rate:  [86-101] 101 (11/06 0448) Cardiac Rhythm:  [-] Normal sinus rhythm (11/05 1940) Resp:  [18-20] 20 (11/06 0448) BP: (118-131)/(58-68) 121/60 mmHg (11/06 0448) SpO2:  [9 %-99 %] 98 % (11/06 0448)     Intake/Output from previous day: 11/05 0701 - 11/06 0700 In: 530 [P.O.:480; IV Piggyback:50] Out: 1800 [Urine:1800]   Physical Exam:  Cardiovascular: Tachycardic Pulmonary: Clear to auscultation on the right and slightly diminished at left base;no rales, wheezes, or rhonchi. Abdomen: Soft, non tender, bowel sounds present. Extremities: Mild bilateral lower extremity edema. Wounds: Clean and dry.  No erythema or signs of infection. Chest Tube: to water seal and no air leak  Lab Results: CBC:  Recent Labs  10/27/13 0405 10/28/13 0425  WBC 19.4* 19.2*  HGB 11.2* 11.4*  HCT 32.7* 33.9*  PLT 238 253   BMET:   Recent Labs  10/28/13 0425 10/29/13 0450  NA 128* 133*  K 4.0 3.3*  CL 93* 92*  CO2 25 30  GLUCOSE 113* 101*  BUN 14 11  CREATININE 0.71 0.65  CALCIUM 8.5 8.5    PT/INR: No results found for this basename: LABPROT, INR,  in the last 72 hours ABG:  INR: Will add last result for INR, ABG once components are confirmed Will add last 4 CBG results once components are confirmed  Assessment/Plan:  1. CV - SR/ST. On Lopressor 12.5 bid. 2.  Pulmonary - Chest tube with scant output last 24 hours. There is no air leak.  Likely remove chest tube today.On Rocephin.On 2 liters of oxygen via [place] as tolerates. Encourage incentive spirometer and flutter  valve. Check CXR this am 3. Hypokalemia-supplement potassium 4. Will need SNF when ready for discharge  ZIMMERMAN,Rachel Dodson 10/29/2013,7:39 AM  No drainage from ct will remove, still with consolidation left lower lobe Fu pa and lat chest xray I have seen and examined Rachel Dodson and agree with the above assessment  and plan.  Delight Ovens MD Beeper [phone] Office [phone] 10/29/2013 8:47 AM

## 2013-10-29 NOTE — Progress Notes (Addendum)
Left chest tube removed per MD order; sutures tied and secured; Vaseline gauze dressing applied; 2 view chest x-ray ordered per MD; patient states relief of discomfort; no pain; will continue to monitor.  BARNETT, Geroge Baseman

## 2013-10-29 NOTE — Progress Notes (Signed)
PULMONARY  / CRITICAL CARE MEDICINE  Name: Rachel Dodson MRN: [id number] DOB: Aug 06, 1946    ADMISSION DATE:  10/20/2013 CONSULTATION DATE:  10/23/13   REFERRING MD :  Dr. Tyrone Sage PRIMARY SERVICE: TRH-->PCCM  CHIEF COMPLAINT:  Post-Op Resp Fx  BRIEF PATIENT DESCRIPTION: 67 y/o F admitted on 10/28 with 3d hx of SOB, back pain & dizziness after a fall.  CT Chest found moderate loculate L pleural effusion & pulmonary nodule.  Underwent VATS drainage 10/31.  PCCM consulted for post-op respiratory failure.     SIGNIFICANT EVENTS / STUDIES:  10/28 - Admit post mechanical fall w/ SOB, back pain.  Loculated effusion on CT + pulm nodule 10/31 - VATS with drainage of empyema / decortication   LINES / TUBES: 10-31 OTT>>10/31 10-31 rt [place] cvl>> 10-31 lt rad aline>>out 10-31 lt c t x 2>>1 posterior remains>>>  CULTURES: 10-30 pleural fluid>>  Neg afb  Neg yeast  10-28 uc>>ecoli ss cipro 10-30 bc x 2>>not done  PATH: Cytology neg   ANTIBIOTICS: 10-31 cipro>> 11/1  11/2 vanc >>11-4 11/2 imi >>11-4 11-4 roc>>  SUBJECTIVE/OVERNIGHT: NAD, chest tube remains an on o2, neg balance with good crrt  VITAL SIGNS: Temp:  [98.3 F (36.8 C)-98.5 F (36.9 C)] 98.5 F (36.9 C) (11/06 0448) Pulse Rate:  [86-101] 101 (11/06 0448) Resp:  [18-20] 20 (11/06 0448) BP: (118-131)/(58-68) 121/60 mmHg (11/06 0448) SpO2:  [9 %-99 %] 98 % (11/06 0448) HEMODYNAMICS:   VENTILATOR SETTINGS:   INTAKE / OUTPUT: Intake/Output     11/05 0701 - 11/06 0700 11/06 0701 - 11/07 0700   P.O. 480    I.V. (mL/kg)     IV Piggyback 50    Total Intake(mL/kg) 530 (8.1)    Urine (mL/kg/hr) 1800 (1.1)    Chest Tube     Total Output 1800     Net -1270          Urine Occurrence 3 x      PHYSICAL EXAMINATION: General: no distress in bed HEENT: jvd wnl PULM: diminished left base remains, left ct to water seal , no air leak CV: s1/s2 rrt AB: bS+, nontender Ext: cool, trace edema  LABS:  CBC Recent Labs      10/27/13  0405  10/28/13  0425  WBC  19.4*  19.2*  HGB  11.2*  11.4*  HCT  32.7*  33.9*  PLT  238  253   Coag's No results found for this basename: APTT, INR,  in the last 72 hours BMET Recent Labs     10/27/13  1735  10/28/13  0425  10/29/13  0450  NA  129*  128*  133*  K  3.7  4.0  3.3*  CL  92*  93*  92*  CO2  27  25  30   BUN  14  14  11   CREATININE  0.67  0.71  0.65  GLUCOSE  105*  113*  101*   Electrolytes Recent Labs     10/27/13  0405  10/27/13  1735  10/28/13  0425  10/29/13  0450  CALCIUM  8.1*  8.3*  8.5  8.5  MG  1.7   --   2.0  1.7  PHOS  3.1   --   3.3  3.4   Sepsis Markers No results found for this basename: LACTICACIDVEN, PROCALCITON, O2SATVEN,  in the last 72 hours ABG No results found for this basename: PHART, PCO2ART, PO2ART,  in the last 72 hours  Liver Enzymes No results found for this basename: AST, ALT, ALKPHOS, BILITOT, ALBUMIN,  in the last 72 hours Cardiac Enzymes No results found for this basename: TROPONINI, PROBNP,  in the last 72 hours Glucose No results found for this basename: GLUCAP,  in the last 72 hours  Imaging Dg Chest Port 1 View  10/29/2013   CLINICAL DATA:  Post left-sided VATS  EXAM: PORTABLE CHEST - 1 VIEW  COMPARISON:  10/28/2013; 10/20/2013; chest CT -10/20/2013  FINDINGS: Grossly unchanged cardiac silhouette and mediastinal contours with obscuration of the left heart border secondary to moderate-sized left-sided partially loculated left-sided effusion with associated left mid and lower lung heterogeneous /consolidative opacities. Unchanged tiny left apical pneumothorax. Stable positioning of support apparatus. The right hemithorax is unchanged. Unchanged bones.  IMPRESSION: 1. Stable positioning of support apparatus. The tiny amount of air within the moderate-sized left-sided hydropneumothorax is grossly unchanged.  2. Grossly unchanged left mid and lower lung heterogeneous size consolidative opacities, atelectasis versus  infiltrate.   Electronically Signed   By: Simonne Come M.D.   On: 10/29/2013 08:44   Dg Chest Port 1 View  10/28/2013   CLINICAL DATA:  Difficulty breathing; recent pneumothorax  EXAM: PORTABLE CHEST - 1 VIEW  COMPARISON:  October 27, 2013 chest radiograph and chest CT October 20, 2013  FINDINGS: The chest tube position on the left is stable. Central catheter tip is in the superior vena cava. There is the currently a minimal left apical pneumothorax.  There is extensive consolidation throughout the left lower lobe. Right lung is clear. 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 Heart is enlarged with normal pulmonary vascularity.  IMPRESSION: Minimal left apical pneumothorax. Persistent extensive left lower lobe consolidation. Stable cardiac enlargement. Sizable paraesophageal type hernia.   Electronically Signed   By: Bretta Bang M.D.   On: 10/28/2013 08:00     CXR: see above  ASSESSMENT / PLAN:  PULMONARY A:VDRF post vats 10-31 > resolved    Vats for empyema  Dense infiltrate P:   Neg balance goal remain One chest tube remains, per cvts, no air leak, likely to dc 11-6 Cont IV abx for now Will need follow up pcxr, she remains with dense infiltrate, hope after CT removal aeration will improve If remains with dense changes, will consider CT chest  CARDIOVASCULAR A:      CHF  Intake/Output Summary (Last 24 hours) at 10/29/13 0944 Last data filed at 10/29/13 0700  Gross per 24 hour  Intake    530 ml  Output   1400 ml  Net   -870 ml    P:  KVO fluids  Lasix  Increased 11-4, had neg .8 liter, maintain, has tolerated well Chem in am   RENAL Lab Results  Component Value Date   CREATININE 0.65 10/29/2013   CREATININE 0.71 10/28/2013   CREATININE 0.67 10/27/2013    Recent Labs Lab 10/27/13 1735 10/28/13 0425 10/29/13 0450  NA 129* 128* 133*    Intake/Output Summary (Last 24 hours) at 10/29/13 0943 Last data filed at 10/29/13 0700  Gross per 24 hour  Intake     530 ml  Output   1400 ml  Net   -870 ml   A: Mild renal insuff base .9 - improved  Hyponatremia > likely siadh, dilutional Hypok, hypoMag P:   F/u chem in am  Lasix continue Replace mag, k  GASTROINTESTINAL A:  No active issue  P:   PPI diet  HEMATOLOGIC A:  Mild leukocytosis,  hemoconcetration P:  Ambulates lawix  INFECTIOUS A:  PostVATS for empyema> cultures negative P:   Ceftriaxone, see stop date Will change to Augmentin soon orals  NEUROLOGIC A: Post op pain  P:   controlled Ambulating  Brett Canales Minor ACNP [facility] PCCM Pager [phone] till 3 pm If no answer page [phone] 10/29/2013, 9:43 AM  I have fully examined this patient and agree with above findings.    And edited note in full  Mcarthur Rossetti. Tyson Alias, MD, FACP Pgr: [phone] [facility]\

## 2013-10-30 ENCOUNTER — Encounter (HOSPITAL_COMMUNITY): Payer: Self-pay | Admitting: Radiology

## 2013-10-30 ENCOUNTER — Inpatient Hospital Stay (HOSPITAL_COMMUNITY): Payer: Medicare Other

## 2013-10-30 ENCOUNTER — Inpatient Hospital Stay
Admission: RE | Admit: 2013-10-30 | Discharge: 2013-11-28 | Disposition: A | Discharge status: Home / Self Care | Payer: Medicare Other | Source: Ambulatory Visit | Attending: Internal Medicine | Admitting: Internal Medicine

## 2013-10-30 DIAGNOSIS — G8929 Other chronic pain: Principal | ICD-10-CM

## 2013-10-30 LAB — BASIC METABOLIC PANEL
BUN: 9 mg/dL (ref 6–23)
Calcium: 8 mg/dL — ABNORMAL LOW (ref 8.4–10.5)
Chloride: 92 mEq/L — ABNORMAL LOW (ref 96–112)
Creatinine, Ser: 0.64 mg/dL (ref 0.50–1.10)
GFR calc Af Amer: >90 mL/min (ref >90)
GFR calc non Af Amer: 90 mL/min — ABNORMAL LOW (ref >90)

## 2013-10-30 LAB — CBC
HCT: 32 % — ABNORMAL LOW (ref 36.0–46.0)
MCH: 31.2 pg (ref 26.0–34.0)
MCHC: 33.4 g/dL (ref 30.0–36.0)
MCV: 93.3 fL (ref 78.0–100.0)
Platelets: 268 10*3/uL (ref 150–400)
RDW: 13.5 % (ref 11.5–15.5)

## 2013-10-30 MED ORDER — POTASSIUM CHLORIDE CRYS ER 20 MEQ PO TBCR: 20.0000 meq | EXTENDED_RELEASE_TABLET | Freq: Every day | ORAL | Status: DC

## 2013-10-30 MED ORDER — FUROSEMIDE 40 MG PO TABS
40.0000 mg | ORAL_TABLET | Freq: Every day | ORAL | Status: DC
  Filled 2013-10-30: qty 1

## 2013-10-30 MED ORDER — AMOXICILLIN-POT CLAVULANATE 875-125 MG PO TABS: 1.0000 | ORAL_TABLET | Freq: Two times a day (BID) | ORAL | Status: DC

## 2013-10-30 MED ORDER — POTASSIUM CHLORIDE CRYS ER 20 MEQ PO TBCR
30.0000 meq | EXTENDED_RELEASE_TABLET | Freq: Once | ORAL | Status: AC
  Administered 2013-10-30: 30 meq via ORAL
  Filled 2013-10-30: qty 1

## 2013-10-30 MED ORDER — FE FUMARATE-B12-VIT C-FA-IFC PO CAPS: 1.0000 | ORAL_CAPSULE | Freq: Three times a day (TID) | ORAL | Status: DC

## 2013-10-30 MED ORDER — FUROSEMIDE 40 MG PO TABS: 40.0000 mg | ORAL_TABLET | Freq: Every day | ORAL | Status: DC

## 2013-10-30 MED ORDER — POTASSIUM CHLORIDE CRYS ER 20 MEQ PO TBCR
30.0000 meq | EXTENDED_RELEASE_TABLET | Freq: Two times a day (BID) | ORAL | Status: DC
  Filled 2013-10-30 (×2): qty 1

## 2013-10-30 MED ORDER — AMOXICILLIN-POT CLAVULANATE 875-125 MG PO TABS
1.0000 | ORAL_TABLET | Freq: Two times a day (BID) | ORAL | Status: DC
  Administered 2013-10-30: 1 via ORAL
  Filled 2013-10-30 (×2): qty 1

## 2013-10-30 MED ORDER — IOHEXOL 300 MG/ML  SOLN
80.0000 mL | Freq: Once | INTRAMUSCULAR | Status: AC | PRN
  Administered 2013-10-30: 80 mL via INTRAVENOUS

## 2013-10-30 MED ORDER — OXYCODONE-ACETAMINOPHEN 5-325 MG PO TABS: 1.0000 | ORAL_TABLET | ORAL | Status: DC | PRN

## 2013-10-30 MED ORDER — ALPRAZOLAM 0.25 MG PO TABS
0.5000 mg | ORAL_TABLET | Freq: Once | ORAL | Status: AC
  Administered 2013-10-30: 0.5 mg via ORAL
  Filled 2013-10-30: qty 2

## 2013-10-30 MED ORDER — POTASSIUM CHLORIDE CRYS ER 20 MEQ PO TBCR
30.0000 meq | EXTENDED_RELEASE_TABLET | Freq: Every day | ORAL | Status: DC
  Administered 2013-10-30: 30 meq via ORAL
  Filled 2013-10-30: qty 1

## 2013-10-30 MED ORDER — ALBUTEROL SULFATE (5 MG/ML) 0.5% IN NEBU: 2.5000 mg | INHALATION_SOLUTION | RESPIRATORY_TRACT | Status: DC | PRN

## 2013-10-30 NOTE — Progress Notes (Signed)
Clinical Social Work Department CLINICAL SOCIAL WORK PLACEMENT NOTE 10/30/2013  Patient:  Rachel Dodson, Rachel Dodson  Account Number:  [id number] Admit date:  10/20/2013  Clinical Social Worker:  Carren Rang  Date/time:  10/27/2013 03:55 PM  Clinical Social Work is seeking post-discharge placement for this patient at the following level of care:   SKILLED NURSING   (*CSW will update this form in Epic as items are completed)   10/27/2013  Patient/family provided with [facility] Department of Clinical Social Work's list of facilities offering this level of care within the geographic area requested by the patient (or if unable, by the patient's family).  10/27/2013  Patient/family informed of their freedom to choose among providers that offer the needed level of care, that participate in Medicare, Medicaid or managed care program needed by the patient, have an available bed and are willing to accept the patient.  10/27/2013  Patient/family informed of [facility]' ownership interest in [facility], as well as of the fact that they are under no obligation to receive care at this facility.  PASARR submitted to EDS on 10/27/2013 PASARR number received from EDS on   FL2 transmitted to all facilities in geographic area requested by pt/family on  10/27/2013 FL2 transmitted to all facilities within larger geographic area on   Patient informed that his/her managed care company has contracts with or will negotiate with  certain facilities, including the following:     Patient/family informed of bed offers received:  10/28/2013 Patient chooses bed at [facility] Physician recommends and patient chooses bed at    Patient to be transferred to [facility] on  10/30/2013 Patient to be transferred to facility by EMS  The following physician request were entered in Epic:   Additional Comments:  Maree Krabbe, MSW, [employer] [phone]

## 2013-10-30 NOTE — Progress Notes (Signed)
10/30/2013 1500 Discharge avs form, medications already taken today and those due this evening given and explained to patient. Follow up appointments, incision site care and when to call MD also reviewed. Received verbal order from Brett Canales Minor NP to leave CTS in place at time of discharge and leave in place 4-5 days. Report called to [facility] and Questions and concerns addressed. Central line d/c earlier in shift by Ander Purpura RN and PIV d/c prior to d/c home by NT. D/c tele. D/c SNF via ambulance transport per orders.  Astou Lada, Blanchard Kelch

## 2013-10-30 NOTE — Progress Notes (Signed)
Clinical Social Worker facilitated patient discharge by contacting the patient and facility, [facility]. Patient agreeable to this plan and arranging transport via EMS. When asked if CSW needed to contact family, patient declined stating they already know and she has talked to her cousin. CSW arranged transportation for 3 pm . CSW will sign off, as social work intervention is no longer needed.  Maree Krabbe, MSW, Theresia Majors [phone]

## 2013-10-30 NOTE — Progress Notes (Signed)
PULMONARY  / CRITICAL CARE MEDICINE  Name: Rachel Dodson MRN: [id number] DOB: 03/30/1946    ADMISSION DATE:  10/20/2013 CONSULTATION DATE:  10/23/13   REFERRING MD :  Dr. Tyrone Sage PRIMARY SERVICE: TRH-->PCCM  CHIEF COMPLAINT:  Post-Op Resp Fx  BRIEF PATIENT DESCRIPTION:  67 yo female admitted with dyspnea, dizziness and back pain.  Found to have loculated Lt pleural effusion and pulmonary nodule requiring VATS.  PCCM assumed care while in ICU.  SIGNIFICANT EVENTS: 10/28 Admit 10/31 Lt VATS decortication  STUDIES:  10/28 CT chest >> Lt loculated effusion, 14 mm nodule RLL, large hiatal hernia  10/30 CT abd/pelvis >> no acute process 11/07 CT chest >>   LINES / TUBES: 10-31 OTT>>10/31 10-31 rt [place] cvl>>11-7 10-31 lt rad aline>>out 10-31 lt c t x 2>>1 posterior remains>>>11-6  CULTURES: 10-30 pleural fluid>>  Neg afb  Neg yeast  10-28 uc>>ecoli ss cipro 10-30 bc x 2>>not done  PATH: Cytology neg   ANTIBIOTICS: 10-31 cipro>> 11/1  11/2 vanc >>11-4 11/2 imi >>11-4 11-4 roc>>11-7 11-7 Augmentin>>  SUBJECTIVE: Chest tube out. NAD at rest  VITAL SIGNS: Temp:  [97.8 F (36.6 C)-98.7 F (37.1 C)] 97.9 F (36.6 C) (11/07 0431) Pulse Rate:  [90-107] 107 (11/07 0431) Resp:  [18] 18 (11/07 0431) BP: (125-140)/(62-69) 140/69 mmHg (11/07 0431) SpO2:  [91 %-98 %] 91 % (11/07 0431) 2 liters [place]  INTAKE / OUTPUT: Intake/Output     11/06 0701 - 11/07 0700 11/07 0701 - 11/08 0700   P.O. 960    I.V. (mL/kg) 350 (5.3)    IV Piggyback 50    Total Intake(mL/kg) 1360 (20.7)    Urine (mL/kg/hr) 2025 (1.3)    Total Output 2025     Net -665            PHYSICAL EXAMINATION: General: no distress in chair HEENT: jvd wnl PULM: diminished left base remains, no drainage from ct site CV: s1/s2 rrt AB: bS+, nontender Ext: cool, trace edema  LABS:  CBC Recent Labs     10/28/13  0425  10/30/13  0520  WBC  19.2*  16.8*  HGB  11.4*  10.7*  HCT  33.9*  32.0*  PLT  253   268   BMET Recent Labs     10/28/13  0425  10/29/13  0450  10/30/13  0520  NA  128*  133*  131*  K  4.0  3.3*  3.2*  CL  93*  92*  92*  CO2  25  30  30   BUN  14  11  9   CREATININE  0.71  0.65  0.64  GLUCOSE  113*  101*  111*   Electrolytes Recent Labs     10/28/13  0425  10/29/13  0450  10/30/13  0520  CALCIUM  8.5  8.5  8.0*  MG  2.0  1.7   --   PHOS  3.3  3.4   --    Imaging Dg Chest 2 View  10/30/2013   CLINICAL DATA:  Chest tube removal  EXAM: CHEST  2 VIEW  COMPARISON:  10/29/2013; 10/28/2013; 10/27/2013  FINDINGS: Grossly unchanged cardiac silhouette and mediastinal contours with aspiration of the left heart border secondary to grossly unchanged moderate sized partially loculated left-sided pleural effusion with associated left mid and lower lung heterogeneous/consolidative opacities. Additional loculated fluid is again noted within the posterior aspect of the left upper thorax. Suspected tiny left apical pneumothorax.  Stable position of support apparatus. The  right hemi thorax is unchanged. Grossly unchanged bones.  IMPRESSION: Suspected tiny amount of air within otherwise unchanged moderate size partially loculated left-sided hydro pneumothorax with associated left mid and lower lung heterogeneous/ consolidative opacities, atelectasis versus infiltrate.   Electronically Signed   By: Simonne Come M.D.   On: 10/30/2013 07:47   Dg Chest 2 View  10/29/2013   CLINICAL DATA:  Post chest tube removal  EXAM: CHEST  2 VIEW  COMPARISON:  10/29/2013  FINDINGS: Interval removal of the left thoracostomy tube.  Persistent partially loculated left pleural effusion is identified.  Slightly decreased effusion and atelectasis at left base noted.  Left subclavian central venous catheter with tip projecting over SVC.  Mild enlargement of cardiac silhouette.  Large hiatal hernia.  Right lung clear.  No definite pneumothorax.  Bones demineralized.  IMPRESSION: No pneumothorax following left  thoracostomy tube removal.  Persistent partially loculated left pleural effusion and basilar atelectasis though basilar aeration has slightly improved since previous exam.  Large hiatal hernia.   Electronically Signed   By: Ulyses Southward M.D.   On: 10/29/2013 14:38   Dg Chest Port 1 View  10/29/2013   CLINICAL DATA:  Post left-sided VATS  EXAM: PORTABLE CHEST - 1 VIEW  COMPARISON:  10/28/2013; 10/20/2013; chest CT -10/20/2013  FINDINGS: Grossly unchanged cardiac silhouette and mediastinal contours with obscuration of the left heart border secondary to moderate-sized left-sided partially loculated left-sided effusion with associated left mid and lower lung heterogeneous /consolidative opacities. Unchanged tiny left apical pneumothorax. Stable positioning of support apparatus. The right hemithorax is unchanged. Unchanged bones.  IMPRESSION: 1. Stable positioning of support apparatus. The tiny amount of air within the moderate-sized left-sided hydropneumothorax is grossly unchanged.  2. Grossly unchanged left mid and lower lung heterogeneous size consolidative opacities, atelectasis versus infiltrate.   Electronically Signed   By: Simonne Come M.D.   On: 10/29/2013 08:44     ASSESSMENT / PLAN:  A:  Lt empyema s/p VATS. RLL pulmonary nodule. P:   -continue Abx -f/u CXR -oxygen to keep SpO2 > 92% -bronchial hygiene -prn BD's -f/u CT chest 11/7  A: Hx of HTN. Hypervolemia. P:  -KVO fluids  -continue lasix for goal negative fluid balance -d/c CVL -continue lopressor  A: Anemia of critical illness. P: -f/u CBC -SQ heparin for DVT prevention  A: Post-op pain control. Anxiety. Deconditioning. P: -prn tylenol, percocet -cymbalta, klonopin -SNF placement  [facility] Minor ACNP [facility] PCCM Pager [phone] till 3 pm If no answer page [phone] 10/30/2013, 8:55 AM  Reviewed above, examined pt, and documentation changes made as needed.  If stable overnight and CT chest looks improved, then  consider d/c over weekend.  Coralyn Helling, MD [facility] Pulmonary/Critical Care 10/30/2013, 11:43 AM Pager:  [phone] After 3pm call: [phone]

## 2013-10-30 NOTE — Progress Notes (Signed)
      [address]       [place] [address]             [phone]       7 Days Post-Op Procedure(s) (LRB): VIDEO BRONCHOSCOPY (N/A) VIDEO ASSISTED THORACOSCOPY (Left)  Subjective: Patient without complaints this am  Objective: Vital signs in last 24 hours: Temp:  [97.8 F (36.6 C)-98.7 F (37.1 C)] 97.9 F (36.6 C) (11/07 0431) Pulse Rate:  [90-107] 107 (11/07 0431) Cardiac Rhythm:  [-] Normal sinus rhythm (11/06 1930) Resp:  [18] 18 (11/07 0431) BP: (125-140)/(62-69) 140/69 mmHg (11/07 0431) SpO2:  [91 %-98 %] 91 % (11/07 0431)     Intake/Output from previous day: 11/06 0701 - 11/07 0700 In: 1360 [P.O.:960; I.V.:350; IV Piggyback:50] Out: 2025 [Urine:2025]   Physical Exam:  Cardiovascular: Tachycardic Pulmonary: Clear to auscultation on the right and some crackles and diminished at left base;no rales, wheezes, or rhonchi. Abdomen: Soft, non tender, bowel sounds present. Wounds: Clean and dry.  No erythema or signs of infection.   Lab Results: CBC:  Recent Labs  10/28/13 0425 10/30/13 0520  WBC 19.2* 16.8*  HGB 11.4* 10.7*  HCT 33.9* 32.0*  PLT 253 268   BMET:   Recent Labs  10/29/13 0450 10/30/13 0520  NA 133* 131*  K 3.3* 3.2*  CL 92* 92*  CO2 30 30  GLUCOSE 101* 111*  BUN 11 9  CREATININE 0.65 0.64  CALCIUM 8.5 8.0*    PT/INR: No results found for this basename: LABPROT, INR,  in the last 72 hours ABG:  INR: Will add last result for INR, ABG once components are confirmed Will add last 4 CBG results once components are confirmed  Assessment/Plan:  1. CV - SR/ST. On Lopressor 12.5 bid. 2.  Pulmonary - Chest tube removed yesterday. CXR this am shows possible left apical pneumothorax and consolidation at left base.On Rocephin.On 2 liters of oxygen via -wean as tolerates. Encourage incentive spirometer and flutter valve.  3. Anemia - H and H 10.7 and 32. 4. Hypokalemia - will supplement. She is on Lasix 60 IV bid.  Will schedule daily potassium.  Briseis Aguilera MPA-C 10/30/2013,7:47 AM

## 2013-10-30 NOTE — Discharge Summary (Signed)
Physician Discharge Summary  Patient ID: Rachel Dodson MRN: [id number] DOB/AGE: July 25, 1946 67 y.o.  Admit date: 10/20/2013 Discharge date: 10/30/2013  Problem List Active Problems:   Pleural effusion   Lung mass   UTI (urinary tract infection)   Sepsis   Hypotension, unspecified   Leukocytosis, unspecified   Acute respiratory failure  HPI: 67 year old female who came to the ED with a chief complaint of weakness and ongoing shortness of breath, back pain dizziness. Patient says that she fell in the shower 3 days ago. She also admits to having some chest pain associated with shortness of breath. Patient lives by herself, patient is a former smoker. In the ED chest x-ray was done which showed large effusion on the left side and it was followed by CT chest which showed moderate loculated left pleural effusion and appearance of 40 mm superior segment right lower lobe pulmonary nodule rare the possibility of malignant effusion. The possibility of nodule representing primary tumor, bronchogenic carcinoma.  Hospital Course:  SIGNIFICANT EVENTS:  10/28 Admit  10/31 Lt VATS decortication  STUDIES:  10/28 CT chest >> Lt loculated effusion, 14 mm nodule RLL, large hiatal hernia  10/30 CT abd/pelvis >> no acute process  11/07 CT chest >> no acute process LINES / TUBES:  10-31 OTT>>10/31  10-31 rt [place] cvl>>11-7  10-31 lt rad aline>>out  10-31 lt c t x 2>>1 posterior remains>>>11-6  CULTURES:  10-30 pleural fluid>>  Neg afb  Neg yeast  10-28 uc>>ecoli ss cipro  10-30 bc x 2>>not done  PATH:  Cytology neg  ANTIBIOTICS:  10-31 cipro>> 11/1  11/2 vanc >>11-4  11/2 imi >>11-4  11-4 roc>>11-7  11-7 Augmentin>> x 5 days dc date 11-12  ASSESSMENT / PLAN:  A:  Lt empyema s/p VATS.  RLL pulmonary nodule.  P:  -continue Abx  -f/u CXR  -oxygen to keep SpO2 > 92%  -bronchial hygiene  -prn BD's  -f/u CT chest 11/7 no acute process A:  Hx of HTN.  Hypervolemia.  P:  -KVO fluids   -continue lasix for goal negative fluid balance  -d/c CVL  -continue lopressor  A: Anemia of critical illness.  P:  -f/u CBC  -SQ heparin for DVT prevention  A:  Post-op pain control.  Anxiety.  Deconditioning.  P:  -prn tylenol, percocet  -cymbalta, klonopin  -SNF placement        Labs at discharge Lab Results  Component Value Date   CREATININE 0.64 10/30/2013   BUN 9 10/30/2013   NA 131* 10/30/2013   K 3.2* 10/30/2013   CL 92* 10/30/2013   CO2 30 10/30/2013   Lab Results  Component Value Date   WBC 16.8* 10/30/2013   HGB 10.7* 10/30/2013   HCT 32.0* 10/30/2013   MCV 93.3 10/30/2013   PLT 268 10/30/2013   Lab Results  Component Value Date   ALT 16 10/26/2013   AST 19 10/26/2013   ALKPHOS 97 10/26/2013   BILITOT 0.3 10/26/2013   Lab Results  Component Value Date   INR 1.36 10/22/2013    Current radiology studies Dg Chest 2 View  10/30/2013   CLINICAL DATA:  Chest tube removal  EXAM: CHEST  2 VIEW  COMPARISON:  10/29/2013; 10/28/2013; 10/27/2013  FINDINGS: Grossly unchanged cardiac silhouette and mediastinal contours with aspiration of the left heart border secondary to grossly unchanged moderate sized partially loculated left-sided pleural effusion with associated left mid and lower lung heterogeneous/consolidative opacities. Additional loculated fluid is again noted within  the posterior aspect of the left upper thorax. Suspected tiny left apical pneumothorax.  Stable position of support apparatus. The right hemi thorax is unchanged. Grossly unchanged bones.  IMPRESSION: Suspected tiny amount of air within otherwise unchanged moderate size partially loculated left-sided hydro pneumothorax with associated left mid and lower lung heterogeneous/ consolidative opacities, atelectasis versus infiltrate.   Electronically Signed   By: Simonne Come M.D.   On: 10/30/2013 07:47   Dg Chest 2 View  10/29/2013   CLINICAL DATA:  Post chest tube removal  EXAM: CHEST  2 VIEW  COMPARISON:   10/29/2013  FINDINGS: Interval removal of the left thoracostomy tube.  Persistent partially loculated left pleural effusion is identified.  Slightly decreased effusion and atelectasis at left base noted.  Left subclavian central venous catheter with tip projecting over SVC.  Mild enlargement of cardiac silhouette.  Large hiatal hernia.  Right lung clear.  No definite pneumothorax.  Bones demineralized.  IMPRESSION: No pneumothorax following left thoracostomy tube removal.  Persistent partially loculated left pleural effusion and basilar atelectasis though basilar aeration has slightly improved since previous exam.  Large hiatal hernia.   Electronically Signed   By: Ulyses Southward M.D.   On: 10/29/2013 14:38   Ct Chest W Contrast  10/30/2013   CLINICAL DATA:  Follow up empyema. Chest tube removal.  EXAM: CT CHEST WITH CONTRAST  TECHNIQUE: Multidetector CT imaging of the chest was performed during intravenous contrast administration.  CONTRAST:  80mL OMNIPAQUE IOHEXOL 300 MG/ML  SOLN  COMPARISON:  Radiographs 10/30/2013. CT 10/20/2013.  FINDINGS: Left chest tube has been recently removed. The overall volume of the left pleural effusion has decreased. However, there are multiple residual loculated components associated with irregular pleural thickening. Posterior to left upper lobe is a 6.4 x 4.0 cm loculated component on image 15. There are smaller complex loculated components posterior and lateral to the lower lobe. Scattered air bubbles are present within the pleural space related to the recent chest tube. There is no significant pneumothorax.  There is no right-sided pleural or significant pericardial effusion. Large hiatal hernia is again noted. Small mediastinal and hilar lymph nodes are stable, likely reactive. There is atherosclerosis of the aorta, great vessels and coronary arteries.  Bilateral breast implants are noted. There is a right subclavian central venous catheter with its tip in the mid SVC.  1.2 cm  lobulated right lower lobe pulmonary nodule is again noted on image 22. No other pulmonary nodules are identified. There is interval improved aeration of the left lower lobe which demonstrates persistent partial collapse.  The visualized upper abdomen demonstrates no suspicious findings. The pancreas appears atrophied. There are stable degenerative changes in the mid thoracic spine. Left 6 rib fracture or thoracotomy defect is noted laterally.  IMPRESSION: 1. Residual loculated left pleural effusion with pleural enhancement consistent with residual empyema as described. The overall volume of fluid has decreased from the prior CT. 2. Interval improved aeration of left lower lobe. 3. Stable right lower lobe pulmonary nodule concerning for malignancy. 4. Large hiatal hernia. 5. Left 6 rib fracture or thoracotomy defect.   Electronically Signed   By: Roxy Horseman M.D.   On: 10/30/2013 11:20   Dg Chest Port 1 View  10/29/2013   CLINICAL DATA:  Post left-sided VATS  EXAM: PORTABLE CHEST - 1 VIEW  COMPARISON:  10/28/2013; 10/20/2013; chest CT -10/20/2013  FINDINGS: Grossly unchanged cardiac silhouette and mediastinal contours with obscuration of the left heart border secondary to moderate-sized left-sided  partially loculated left-sided effusion with associated left mid and lower lung heterogeneous /consolidative opacities. Unchanged tiny left apical pneumothorax. Stable positioning of support apparatus. The right hemithorax is unchanged. Unchanged bones.  IMPRESSION: 1. Stable positioning of support apparatus. The tiny amount of air within the moderate-sized left-sided hydropneumothorax is grossly unchanged.  2. Grossly unchanged left mid and lower lung heterogeneous size consolidative opacities, atelectasis versus infiltrate.   Electronically Signed   By: Simonne Come M.D.   On: 10/29/2013 08:44    Disposition:    Discharge Orders   Future Appointments Provider Department Dept Phone   11/12/2013 12:00 PM Delight Ovens, MD [facility] [phone]   Future Orders Complete By Expires   Discharge to SNF when bed available  As directed        Medication List         albuterol (5 MG/ML) 0.5% nebulizer solution  Commonly known as:  PROVENTIL  Take 0.5 mLs (2.5 mg total) by nebulization every 2 (two) hours as needed for wheezing or shortness of breath.     ALPRAZolam 0.5 MG tablet  Commonly known as:  XANAX  Take 0.5 mg by mouth at bedtime as needed for sleep.     amLODipine 5 MG tablet  Commonly known as:  NORVASC  Take 5 mg by mouth every other day.     amoxicillin-clavulanate 875-125 MG per tablet  Commonly known as:  AUGMENTIN  Take 1 tablet by mouth 2 (two) times daily.     bisoprolol-hydrochlorothiazide 5-6.25 MG per tablet  Commonly known as:  ZIAC  Take 1 tablet by mouth 2 (two) times daily.     clonazePAM 0.5 MG tablet  Commonly known as:  KLONOPIN  Take 0.5 mg by mouth 2 (two) times daily.     DULoxetine 60 MG capsule  Commonly known as:  CYMBALTA  Take 60 mg by mouth daily.     ferrous fumarate-b12-vitamic C-folic acid capsule  Commonly known as:  TRINSICON / FOLTRIN  Take 1 capsule by mouth 3 (three) times daily after meals.     furosemide 40 MG tablet  Commonly known as:  LASIX  Take 1 tablet (40 mg total) by mouth daily.     losartan 50 MG tablet  Commonly known as:  COZAAR  Take 50 mg by mouth daily.     ondansetron 4 MG tablet  Commonly known as:  ZOFRAN  Take 1 tablet (4 mg total) by mouth every 8 (eight) hours as needed for nausea.     oxyCODONE 15 MG immediate release tablet  Commonly known as:  ROXICODONE  Take 15 mg by mouth 3 (three) times daily.     oxyCODONE-acetaminophen 5-325 MG per tablet  Commonly known as:  PERCOCET/ROXICET  Take 1-2 tablets by mouth every 4 (four) hours as needed for severe pain.     pantoprazole 40 MG tablet  Commonly known as:  PROTONIX  Take 40 mg by mouth 2 (two) times  daily.     potassium chloride SA 20 MEQ tablet  Commonly known as:  K-DUR,KLOR-CON  Take 1 tablet (20 mEq total) by mouth daily.           Follow-up Information   Follow up with GERHARDT,EDWARD B, MD. (PA/LAT CXR to be taken (at [facility] which is in the same building as Dr. Dennie Maizes office) on 11/12/2013 at 11:00 am;Appointment with Dr. Tyrone Sage is on 11/12/2013 at 12:00 pm)    Specialty:  Cardiothoracic  Surgery   Contact information:   [address] [place] [address] [phone]        Discharged Condition: fair  Time spent on discharge greater than 40 minutes.  Vital signs at Discharge. Temp:  [97.8 F (36.6 C)-98.7 F (37.1 C)] 97.9 F (36.6 C) (11/07 0431) Pulse Rate:  [90-114] 114 (11/07 0930) Resp:  [18] 18 (11/07 0431) BP: (125-140)/(62-81) 125/81 mmHg (11/07 0930) SpO2:  [91 %-98 %] 91 % (11/07 0431) Office follow up Special Information or instructions. Follow up with Dr. Tyrone Sage as instructed. Signed: Brett Canales Minor ACNP [facility] PCCM Pager [phone] till 3 pm If no answer page [phone] 10/30/2013, 12:51 PM     Coralyn Helling, MD [facility] Pulmonary/Critical Care 10/30/2013, 1:28 PM Pager:  [phone] After 3pm call: [phone]

## 2013-10-30 NOTE — Progress Notes (Signed)
NUTRITION FOLLOW UP  Intervention:    No nutrition intervention warranted at this time  RD to continue to follow  Nutrition Dx:   Inadequate oral intake, resolved  Goal:   Pt to meet >/= 90% of their estimated nutrition needs, met  Monitor:   PO intake, weight, labs, I/O's  Assessment:   PMHx significant for HTN and depression. Admitted with weakness, back pain, dizziness and SOB; s/p fall 3 days PTA. Pt found to have R-sided lung mass and L-sided pleural effusion.  Patient s/p thoracentesis 10/29, VATS 10/31.  Diet advanced from Full Liquids to Carbohydrate Modified Medium Calorie 11/2.  Chest tube removed 11/6.  Patient's appetite much better.  PO intake 75% per flowsheet records.  PT recommending SNF for rehab prior to discharging home alone.  Height: Ht Readings from Last 1 Encounters:  10/22/13 5\' 4"  (1.626 m)    Weight Status:   Wt Readings from Last 1 Encounters:  10/27/13 144 lb 13.5 oz (65.7 kg)    Re-estimated needs:  Kcal: 1600-1800 Protein: 70-85 gm Fluid: 1.6-1.8 L  Skin: chest surgical incision   Diet Order: Carb Control   Intake/Output Summary (Last 24 hours) at 10/30/13 1107 Last data filed at 10/30/13 0900  Gross per 24 hour  Intake   1360 ml  Output   1975 ml  Net   -615 ml    Labs:   Recent Labs Lab 10/27/13 0405  10/28/13 0425 10/29/13 0450 10/30/13 0520  NA 129*  < > 128* 133* 131*  K 4.0  < > 4.0 3.3* 3.2*  CL 95*  < > 93* 92* 92*  CO2 27  < > 25 30 30   BUN 15  < > 14 11 9   CREATININE 0.71  < > 0.71 0.65 0.64  CALCIUM 8.1*  < > 8.5 8.5 8.0*  MG 1.7  --  2.0 1.7  --   PHOS 3.1  --  3.3 3.4  --   GLUCOSE 96  < > 113* 101* 111*  < > = values in this interval not displayed.   Scheduled Meds: . amoxicillin-clavulanate  1 tablet Oral BID  . bisacodyl  10 mg Oral Daily  . clonazePAM  0.5 mg Oral BID  . DULoxetine  60 mg Oral Daily  . feeding supplement (RESOURCE BREEZE)  237 mL Oral TID WC  . ferrous fumarate-b12-vitamic  C-folic acid  1 capsule Oral TID PC  . furosemide  40 mg Oral Daily  . metoprolol tartrate  12.5 mg Oral BID  . pantoprazole  40 mg Oral Q1200  . potassium chloride  30 mEq Oral Daily  . sodium chloride  10-40 mL Intracatheter Q12H  . sodium chloride  3 mL Intravenous Q12H    Continuous Infusions: . sodium chloride    . dextrose 5 % and 0.45% NaCl Stopped (10/28/13 0730)    Maureen Chatters, RD, LDN Pager #: [phone] After-Hours Pager #: [phone]

## 2013-10-31 ENCOUNTER — Telehealth: Payer: Self-pay | Admitting: Critical Care Medicine

## 2013-10-31 ENCOUNTER — Other Ambulatory Visit: Payer: Self-pay | Admitting: Critical Care Medicine

## 2013-10-31 MED ORDER — OXYCODONE HCL 15 MG PO TABS: 15.0000 mg | ORAL_TABLET | Freq: Three times a day (TID) | ORAL | Status: DC

## 2013-10-31 NOTE — Telephone Encounter (Signed)
Opened in error

## 2013-10-31 NOTE — Telephone Encounter (Signed)
SNF needs Rx for pain med not sent on d/c 10/30/13 I faxed OxyIR to SNF

## 2013-11-02 ENCOUNTER — Non-Acute Institutional Stay (SKILLED_NURSING_FACILITY): Payer: Medicare Other | Admitting: Internal Medicine

## 2013-11-02 ENCOUNTER — Other Ambulatory Visit: Payer: Self-pay | Admitting: *Deleted

## 2013-11-02 DIAGNOSIS — J449 Chronic obstructive pulmonary disease, unspecified: Secondary | ICD-10-CM

## 2013-11-02 DIAGNOSIS — I509 Heart failure, unspecified: Secondary | ICD-10-CM

## 2013-11-02 DIAGNOSIS — J869 Pyothorax without fistula: Secondary | ICD-10-CM

## 2013-11-02 MED ORDER — CLONAZEPAM 0.5 MG PO TABS: 0.5000 mg | ORAL_TABLET | Freq: Two times a day (BID) | ORAL | Status: DC

## 2013-11-02 MED ORDER — ALPRAZOLAM 0.5 MG PO TABS: 0.5000 mg | ORAL_TABLET | Freq: Every evening | ORAL | Status: DC | PRN

## 2013-11-02 MED ORDER — OXYCODONE HCL 15 MG PO TABS: 15.0000 mg | ORAL_TABLET | Freq: Three times a day (TID) | ORAL | Status: DC

## 2013-11-02 NOTE — Progress Notes (Signed)
Patient ID: Rachel Dodson, female   DOB: 10/17/1946, 67 y.o.   MRN: [id number]  Facility; [facility] Chief complaint; admission to SNF post admit to [facility] from October 28 to November 7  History;  This is a 67 year old previously healthy woman who is an ex-smoker quitting 4 months ago. Patient tells me she was unwell for the last month prior to admission with some intermittent cough shortness of breath complaints of generalized fatigue and back pain. She also really presented to the hospital where an x-ray showed a large left pleural effusion he was followed by a CT scan of the chest which showed moderate loculated left pleural effusion and a 40 mm superior segment right lower lobe pulmonary nodule. She was started on broad-spectrum antibiotics. As far as I can see cultures of the pleural fluid were negative for CNS, acid-fast bacilli, anaerobes and fungus.. pleural biopsy was done that was negative for malignancy the patient had a Vats procedure. She also had chest tubes which were just pulled out before transfer here. Chest x-ray on November 7 showed no acute process. She was diagnosed as having an empyema although her fluid white count was only 4000. The vats procedure she went into respiratory failure and was monitored by critical care medicine. He was also felt to have a urine culture with Escherichia coli blood cultures were apparently negative   has a past medical history of Hypertension; Depression; and Anxiety.  Past Surgical History  Procedure Laterality Date  . Abdominal hysterectomy    . Video bronchoscopy N/A 10/23/2013    Procedure: VIDEO BRONCHOSCOPY;  Surgeon: Delight Ovens, MD;  Location: [facility] OR;  Service: Thoracic;  Laterality: N/A;  . Video assisted thoracoscopy Left 10/23/2013    Procedure: VIDEO ASSISTED THORACOSCOPY;  Surgeon: Delight Ovens, MD;  Location: [facility] OR;  Service: Thoracic;  Laterality: Left;   Current medications albuterol nebulizers every 2 when necessary,  Augmentin 1 tablet 2 times daily, ferrous fumarate one capsule 3 times daily, Lasix 40 mg daily, Zofran 4 mg every 8 when necessary, oxycodone/acetaminophen 5/325 one to 2 tablets every 4 when necessary, K-Dur 20 milliequivalents daily, Xanax 0.5 mg each bedtime when necessary, Norvasc 5 mg every other day, Ziac 5/6.25 twice a day, Klonopin 0.5 twice a day, Cymbalta 60 daily, Cozaar 50 mg daily, protonic 40 twice a day,  Social history; patient tells me she was previously independent lady living in her own rental house just outside [place]. She was independent with ADLs and IADLs no prior use of ambulatory assist devices. Quit smoking 4 months ago  reports that she has quit smoking. Her smoking use included Cigarettes. She smoked 0.00 packs per day. She does not have any smokeless tobacco history on file. She reports that she does not drink alcohol or use illicit drugs.  Family history; none of relevance  Review of systems Respiratory; patient states she is not coughing and does not feel short of breath Cardiac she does not have exertional chest pain but still some pain in the left posterior chest where her chest tubes were Abdomen; no nausea vomiting or diarrhea Mental status; still admits to some depression  Physical examination Gen.; somewhat depressed looking woman but in no apparent distress O2 sat 90% on room air, pulse 77 respirations 18 she is in no distress Respiratory; coarse crackles over the right lower lobe left lung is clear both upper lungs are clear. There is no digital clubbing Cardiac heart sounds are normal no murmurs she appears to  be euvolemic Abdomen; no liver no spleen no tenderness is noted no masses Lymph none palpable in the cervical clavicular or exhilarated areas Neurologic; she is able to get herself up to a standing position walks with some mild loss of balance however this is very functional I don't see this as a major issue  Impression/plan #1 left-sided  pleural effusion diagnosed as an empyema. She is completing antibiotics although as far as I can tell her cultures were negative. Pleural biopsy was done at the time of the vats procedure which did not show malignancy #2 probable COPD on the background of all of this she quit smoking 4 months ago. Will need to monitor her O2 sats #3 depression on Cymbalta #4 at some point was felt to be fluid overloaded discharged on Lasix this will need to be followed #5 large hiatal hernia on twice a day proton #6 CT scan showing nodule in the superior segment of the right lower lobe. I am not completely certain what followup is planned for this.

## 2013-11-05 ENCOUNTER — Non-Acute Institutional Stay (SKILLED_NURSING_FACILITY): Payer: Medicare Other | Admitting: Internal Medicine

## 2013-11-05 DIAGNOSIS — I10 Essential (primary) hypertension: Secondary | ICD-10-CM

## 2013-11-05 DIAGNOSIS — E871 Hypo-osmolality and hyponatremia: Secondary | ICD-10-CM

## 2013-11-05 DIAGNOSIS — I959 Hypotension, unspecified: Secondary | ICD-10-CM

## 2013-11-05 NOTE — Progress Notes (Signed)
Patient ID: Rachel Dodson, female   DOB: 11/21/1946, 68 y.o.   MRN: [id number]   this is an acute visit.  Level care skilled.  Facility [facility].  Chief complaint-acute visit followup hypotension.  History of present illness.  Patient is a very pleasant 67 year old female here for rehabilitation after being diagnosed in the hospital with a pleural effusion most likely an empyema  She continues on antibiotic therapy for this.  She also has a history of hypertension is on Ziac---5/6.25 Norvasc 5 mg every other day as well as losartan 50 mg a day.  Her list of blood pressures appeared to run 106/65-120/69-however according to physical therapy when she is in therapy and standing her blood pressure at times drops --they got 92/59--she says occasionally in therapy she will feel a bit lightheaded and can tell that s her blood pressure is low he denies any chest pain or shortness of breath associated with this  A she also is on Lasix 40 mg a day for suspicions of some volume overload in the hospital.  Currently she is denying any shortness of breath chest pains headache or dizziness she is lying in bed appears to be comfortable she says she just does not feel all that great in the morning but gets to feel better later in the day.  Family medical social history has been reviewed per admission note on 11/02/2013 in discharge note of 10/30/2013.  Medications have been reviewed per MAR.--I do not she is on Clonopin 0.5 mg twice a day for anxiety as well as Xanax 0.5 mg each bedtime when necessary-she was seen by psych services today as well  In  the facility-for increased anxiety they considered increasing the frequency of her when necessary Xanax but secondary to her blood pressure held off on that for now  Review of systems.  In general denies any fever or chills.  Respiratory no complaints of shortness of breath currently.  Cardiac-does not complaining of chest pain.  GI-no complaints of nausea  vomiting diarrhea constipation.  Muscle skeletal complaints of weakness at times but does not complaining of joint pain today.  Neurologic-did not complain of headache does complain apparently at some dizziness-type feelings during therapy when apparently her blood pressure is low does not associate this however with any chest pain or [place] shortness of breath.  Physical exam.  She is afebrile pulse 66 respirations 20 blood pressures as noted I did take her blood pressures lying and standing-blood pressure lying was 122/62--standing was 114/62 3  In general this is a very pleasant elderly female in no distress.  Her skin is warm and dry.  Chest is clear to auscultation without rhonchi rales or wheezes.  Heart is regular rate and rhythm without murmur gallop or rub.  Abdomen soft nontender with active bowel sounds.  Muscle skeletal I do not note any deformity she moves all times for one actually had her stand up she did not complain of any dizziness or weakness I didt have her stand for some time and took her blood pressure which appear to be relatively stable with her lying blood pressure.  Neurologic is grossly intact her speech is clear cranial nerves appear grossly intact.  Psych she is alert and oriented x3 very pleasant and appropriate.  Labs.  11/04/2013.  WBC 13.8 hemoglobin 11.5 platelets 435.  Sodium 128 potassium 4.9 BUN 10 creatinine 0.85-liver function tests within normal limits except albumin of 2.7.  The liver 7 2014.  WBC 16.8 hemoglobin 10.7  platelets 268.  Liver function tests within normal limits.  Assessment and plan.  #1-hypertension with history of hypotension   duringtherapy-will order blood  pressures lying and standing daily x3 days with a log for provider review-she also received her every other day Norvasc this morning which may have contributed to hypotension will discontinue this  also will discontinue the hydrochlorothiazide aspect of her Ziac  secondary to her already being on Lasix.--If this hypotension persists would consider decreasing her losartan as well  Monitor her blood pressures every shift again with orthostatics once a day.  #2-history of chronic hyponatremia-update labs have been ordered for Monday this will be important to look at as well secondary to decreasing her diuretic hydrochlorothiazide  #3-anxiety-as noted above this was discussed with the psychiatric nurse practitioner who is in the facility today-at this point hesitant to increase  frequency the when necessary Xanax secondary to her low blood pressures-certainly if this stabilizes we'll reconsider      CPT  99309-of note greater than 25 minutes spent assessing patient -- discussing her status with physical therapy as well as nursing staff-reviewing her medical records-and formulating plan of care  Review of systems

## 2013-11-06 ENCOUNTER — Other Ambulatory Visit: Payer: Self-pay | Admitting: *Deleted

## 2013-11-06 MED ORDER — OXYCODONE-ACETAMINOPHEN 5-325 MG PO TABS: ORAL_TABLET | ORAL | Status: DC

## 2013-11-10 ENCOUNTER — Non-Acute Institutional Stay (SKILLED_NURSING_FACILITY): Payer: Medicare Other | Admitting: Internal Medicine

## 2013-11-10 ENCOUNTER — Other Ambulatory Visit: Payer: Self-pay | Admitting: *Deleted

## 2013-11-10 DIAGNOSIS — I959 Hypotension, unspecified: Secondary | ICD-10-CM

## 2013-11-10 DIAGNOSIS — J90 Pleural effusion, not elsewhere classified: Secondary | ICD-10-CM

## 2013-11-10 DIAGNOSIS — E875 Hyperkalemia: Secondary | ICD-10-CM

## 2013-11-10 DIAGNOSIS — E871 Hypo-osmolality and hyponatremia: Secondary | ICD-10-CM | POA: Insufficient documentation

## 2013-11-10 NOTE — Progress Notes (Signed)
Patient ID: Rachel Dodson, female   DOB: 08/14/1946, 67 y.o.   MRN: [id number] this is an acute visit.  Level care skilled.  Facility [facility].   Chief complaint-acute visit followup hypotension--hyponatremia .  History of present illness.  Patient is a very pleasant 67 year old female here for rehabilitation after being diagnosed in the hospital with a pleural effusion most likely an empyema  She continues on antibiotic therapy for this.-  She also has a history of hypertension on Ziac-I discontinued the diuretic component of the back last week she continues on Bisoprol  as well as losartan 50 mg a day-she was on Norvasc every other day this was discontinued last week as well  I did order orthostatic blood pressures in Blood pressures appear to be stable I got manually today 119/68-see previous levels of 122/57-however when standing it appears at times her blood pressure drops and I did get 90/60 today she says her dizziness actually is better compared to last week--nonetheless it does appear her blood pressure drops at times and per chart review it appears this happens occasionally.  Of note the lab today also showed an elevated potassium of 5.6 she is on potassium 20 mEq a day I suspect we will discontinue this  She also has a history of chronic hyponatremia her lab on November 17 show sodium of 1:30 which actually is up from 128 last week.  Her potassium is 5.6 however BUN is 12 creatinine 0.86.  Clinically she says she feels better than last week stronger and apparently is getting more out of therapy.  She does have a history of pleural effusion thought to be empyema--   .  Family medical social history has been reviewed per admission note on 11/02/2013 in discharge note of 10/30/2013.    Medications have been reviewed per MAR.   Review of systems.  In general denies any fever or chills.  Respiratory no complaints of shortness of breath currently.  Cardiac-does not complaining of  chest pain.  GI-no complaints of nausea vomiting diarrhea constipation.  Muscle skeletal complaints of weakness at times but does not complaining of joint pain today.  Neurologic-did not complain of headache does complain apparently at some dizziness-type feelings which have improved over the past week   Psych-does have history of anxiety apparently this is fairly well-controlled on Klonopin .  Physical exam.   She is afebrile pulse is 72 respirations of 19 blood pressure --lying was 119/68-standing was 90/60  In general this is a very pleasant elderly female in no distress.  Her skin is warm and dry.  Chest is clear to auscultation without rhonchi rales or wheezes--she does have a dry dressing left low back.  Heart is regular rate and rhythm without murmur gallop or rub.  Abdomen soft nontender with active bowel sounds.  Muscle skeletal I do not note any deformity she moves all times for one actually had her stand up she did not complain of any dizziness or weakness I didt have her stand for some time-- did complain of a small amount of dizziness but stated it was not bad   Neurologic is grossly intact her speech is clear cranial nerves appear grossly intact.  Psych she is alert and oriented x3 very pleasant and appropriate.   Labs  11/09/2013.  WBC 7.6 hemoglobin 10.7 platelets 354.  Sodium 1:30 potassium 5.6 BUN 12 creatinine 0.86.  .   11/04/2013.  WBC 13.8 hemoglobin 11.5 platelets 435.  Sodium 128 potassium 4.9 BUN 10  creatinine 0.85-liver function tests within normal limits except albumin of 2.7.  The liver 7 2014.  WBC 16.8 hemoglobin 10.7 platelets 268.  Liver function tests within normal limits .  Assessment and plan.  #1-hypertension with history of hypotension--she does appear to have some orthostatic hypotension-also note some chronically low sodium which actually is improved somewhat-I did discuss this with Dr. Leanord Hawking via phone we will decrease her Lasix to 20 mg a  day-also will discontinue potassium supplementation.--We'll order blood pressures lying and standing daily  Update basic metabolic panel later this week on Friday--    #2-history of chronic hyponatremia-this has improved somewhat again we are decreasing her Lasix with an updated metabolic panel later this week  #3-hyperkalemia-as noted above will DC potassium and updated metabolic panel later this week  #4-history pleural effusion-she will have followup per chart review with Dr. Tyrone Sage on November 20 at this point we'll continue the Augmentin until she is evaluated there-- she appears to be clinically stable  [id number]

## 2013-11-12 ENCOUNTER — Inpatient Hospital Stay
Admit: 2013-11-12 | Discharge: 2013-11-12 | Disposition: A | Discharge status: Home / Self Care | Payer: Medicare Other | Attending: Cardiothoracic Surgery | Admitting: Cardiothoracic Surgery

## 2013-11-12 ENCOUNTER — Other Ambulatory Visit: Payer: Self-pay | Admitting: *Deleted

## 2013-11-12 ENCOUNTER — Ambulatory Visit (INDEPENDENT_AMBULATORY_CARE_PROVIDER_SITE_OTHER): Payer: Self-pay | Admitting: Cardiothoracic Surgery

## 2013-11-12 ENCOUNTER — Encounter: Payer: Self-pay | Admitting: Cardiothoracic Surgery

## 2013-11-12 VITALS — BP 114/70 | HR 69 | Resp 20 | Ht 64.0 in | Wt 130.0 lb

## 2013-11-12 DIAGNOSIS — Z9889 Other specified postprocedural states: Secondary | ICD-10-CM

## 2013-11-12 DIAGNOSIS — J869 Pyothorax without fistula: Secondary | ICD-10-CM

## 2013-11-12 DIAGNOSIS — Z09 Encounter for follow-up examination after completed treatment for conditions other than malignant neoplasm: Secondary | ICD-10-CM

## 2013-11-12 DIAGNOSIS — R222 Localized swelling, mass and lump, trunk: Secondary | ICD-10-CM

## 2013-11-12 DIAGNOSIS — J90 Pleural effusion, not elsewhere classified: Secondary | ICD-10-CM

## 2013-11-12 NOTE — Progress Notes (Signed)
[address]       [place] [address]             [phone]                  Rachel Dodson [facility] Medical Record #[id number] Date of Birth: 01-30-1946  Joseph Art, DO Cassell Smiles., MD  Chief Complaint:   PostOp Follow Up Visit 10/23/2013  OPERATIVE REPORT  PREOPERATIVE DIAGNOSIS: Complex large left pleural effusion, probable  empyema.  POSTOPERATIVE DIAGNOSIS: Complex large left pleural effusion, probable  empyema.  SURGICAL PROCEDURE: Bronchoscopy, left video-assisted thoracoscopy,  mini thoracotomy, decortication of the left lung and drainage of  empyema.  SURGEON: Sheliah Plane, MD  History of Present Illness:     Patient returns for followup after recent admission by pulmonology because of respiratory failure, sepsis, right lung mass, left effusion and left empyema. At the end of October she underwent drainage of left pleural effusion/empyema and was continued on antibiotics for extensive lower lobe pneumonia and atelectasis. Cultures were negative at the time of surgery, pathology did not reveal any malignancy in the material removed from the left chest. Preop CT scan of the chest showed a right lower lobe lung nodule that has not been evaluated.  Patient returns to the office today for postop evaluation after drainage of empyema. She appears to be improving at the SNF. She denies fever or chills, respiratory status is improved.     History  Smoking status  . Former Smoker  . Types: Cigarettes  Smokeless tobacco  . Not on file       Allergies  Allergen Reactions  . Latex     Current Outpatient Prescriptions  Medication Sig Dispense Refill  . ALPRAZolam (XANAX) 0.5 MG tablet Take 1 tablet (0.5 mg total) by mouth at bedtime as needed for sleep.  30 tablet  5  . clonazePAM (KLONOPIN) 0.5 MG tablet Take 1 tablet (0.5 mg total) by mouth 2 (two) times daily.  60 tablet  5  . oxyCODONE (ROXICODONE) 15 MG immediate release  tablet Take 1 tablet (15 mg total) by mouth 3 (three) times daily.  90 tablet  0  . oxyCODONE-acetaminophen (PERCOCET/ROXICET) 5-325 MG per tablet Take one or two tablets by mouth every 4 hours as needed for severe pain  360 tablet  0   No current facility-administered medications for this visit.       Physical Exam: BP 114/70  Pulse 69  Resp 20  Ht 5\' 4"  (1.626 m)  Wt 130 lb (58.968 kg)  BMI 22.30 kg/m2  SpO2 97%  General appearance: alert and cooperative Neurologic: intact Heart: regular rate and rhythm, S1, S2 normal, no murmur, click, rub or gallop Lungs: diminished breath sounds LLL Abdomen: soft, non-tender; bowel sounds normal; no masses,  no organomegaly Extremities: extremities normal, atraumatic, no cyanosis or edema and Homans sign is negative, no sign of DVT Wound: The left chest incision and chest tube sites are well-healed the chest tube sutures were removed Wounds:  Diagnostic Studies & Laboratory data:         Recent Radiology Findings: Dg Chest 2 View  11/12/2013   CLINICAL DATA:  Chest mass.  Status post vats  EXAM: CHEST  2 VIEW  COMPARISON:  CT chest 10/30/2013.  Chest x-ray 10/30/2013  FINDINGS: Left pleural effusion/pleural thickening is unchanged. Left lower lobe consolidation is slightly improved. Improved aeration in left lung apex. No pneumothorax.  Right lung  is clear.  Large hiatal hernia.  Right lower lobe nodule seen on the CT is not visualized on today's chest x-ray.  IMPRESSION: Moderate left effusion unchanged. Improved aeration left upper lobe and left lower lobe consolidation  Large hiatal hernia.  No pneumothorax.   Electronically Signed   By: Marlan Palau M.D.   On: 11/12/2013 11:20      Recent Labs: Lab Results  Component Value Date   WBC 16.8* 10/30/2013   HGB 10.7* 10/30/2013   HCT 32.0* 10/30/2013   PLT 268 10/30/2013   GLUCOSE 111* 10/30/2013   ALT 16 10/26/2013   AST 19 10/26/2013   NA 131* 10/30/2013   K 3.2* 10/30/2013   CL 92*  10/30/2013   CREATININE 0.64 10/30/2013   BUN 9 10/30/2013   CO2 30 10/30/2013   INR 1.36 10/22/2013      Assessment / Plan:    #1 Persistent left pleural effusion on chest x-ray, without active evidence of infection #2 right lower lobe lung nodule suspicious for malignancy and long-term smoker but no evaluation yet.  I made arrangements for the patient have a followup CT scan of the chest, primarily to evaluate the persistent left effusion. Depending on the appearance of the scan likely she will have a CT-guided or ultrasound-guided drainage of the left pleural effusion.  Following this she'll only an outpatient PET scan and evaluation of the right lung nodule.   The patient would be an extremely poor operative candidate considering her current medical status, but if the right lung nodule was a malignancy stereotactic radiotherapy could be considered.  At discharge from the pulmonary service no followup pulmonary service was arranged, and called their office today and they will see her back.   I plan to see her after the CT scan is completed  Jerelle Virden B 11/12/2013 11:38 AM

## 2013-11-13 ENCOUNTER — Other Ambulatory Visit: Payer: Self-pay | Admitting: *Deleted

## 2013-11-13 ENCOUNTER — Telehealth: Payer: Self-pay | Admitting: *Deleted

## 2013-11-13 DIAGNOSIS — R918 Other nonspecific abnormal finding of lung field: Secondary | ICD-10-CM

## 2013-11-13 NOTE — Telephone Encounter (Signed)
Her nurse called from [facility] to inquire about the continuation of her antibiotics.  She said the patient said that Dr. Tyrone Sage said to continue them.  I told her to continue them and we would address the issue at her next office visit on 11/17/13.  She agreed.

## 2013-11-16 ENCOUNTER — Other Ambulatory Visit: Payer: Medicare Other

## 2013-11-16 ENCOUNTER — Encounter (HOSPITAL_COMMUNITY): Payer: Self-pay

## 2013-11-16 ENCOUNTER — Ambulatory Visit (HOSPITAL_COMMUNITY)
Admit: 2013-11-16 | Discharge: 2013-11-16 | Disposition: A | Discharge status: Home / Self Care | Payer: Medicare Other | Attending: Cardiothoracic Surgery | Admitting: Cardiothoracic Surgery

## 2013-11-16 DIAGNOSIS — Z978 Presence of other specified devices: Secondary | ICD-10-CM | POA: Insufficient documentation

## 2013-11-16 DIAGNOSIS — K449 Diaphragmatic hernia without obstruction or gangrene: Secondary | ICD-10-CM | POA: Insufficient documentation

## 2013-11-16 DIAGNOSIS — IMO0002 Reserved for concepts with insufficient information to code with codable children: Secondary | ICD-10-CM | POA: Insufficient documentation

## 2013-11-16 DIAGNOSIS — I10 Essential (primary) hypertension: Secondary | ICD-10-CM | POA: Insufficient documentation

## 2013-11-16 DIAGNOSIS — I700 Atherosclerosis of aorta: Secondary | ICD-10-CM | POA: Insufficient documentation

## 2013-11-16 DIAGNOSIS — J90 Pleural effusion, not elsewhere classified: Secondary | ICD-10-CM | POA: Insufficient documentation

## 2013-11-16 DIAGNOSIS — R911 Solitary pulmonary nodule: Secondary | ICD-10-CM | POA: Insufficient documentation

## 2013-11-16 DIAGNOSIS — I059 Rheumatic mitral valve disease, unspecified: Secondary | ICD-10-CM | POA: Insufficient documentation

## 2013-11-16 MED ORDER — IOHEXOL 300 MG/ML  SOLN
80.0000 mL | Freq: Once | INTRAMUSCULAR | Status: AC | PRN
  Administered 2013-11-16: 80 mL via INTRAVENOUS

## 2013-11-17 ENCOUNTER — Ambulatory Visit (INDEPENDENT_AMBULATORY_CARE_PROVIDER_SITE_OTHER): Payer: Self-pay | Admitting: Cardiothoracic Surgery

## 2013-11-17 ENCOUNTER — Encounter: Payer: Self-pay | Admitting: Cardiothoracic Surgery

## 2013-11-17 ENCOUNTER — Other Ambulatory Visit: Payer: Self-pay | Admitting: *Deleted

## 2013-11-17 VITALS — BP 117/68 | HR 66 | Resp 16 | Ht 64.0 in | Wt 131.6 lb

## 2013-11-17 DIAGNOSIS — R911 Solitary pulmonary nodule: Secondary | ICD-10-CM

## 2013-11-17 DIAGNOSIS — Z09 Encounter for follow-up examination after completed treatment for conditions other than malignant neoplasm: Secondary | ICD-10-CM

## 2013-11-17 DIAGNOSIS — J869 Pyothorax without fistula: Secondary | ICD-10-CM

## 2013-11-17 NOTE — Progress Notes (Signed)
Rachel Dodson [facility] Medical Record #[id number] Date of Birth: Nov 10, 1946  Joseph Art, DO Cassell Smiles., MD  Chief Complaint:   PostOp Follow Up Visit 10/23/2013  OPERATIVE REPORT  PREOPERATIVE DIAGNOSIS: Complex large left pleural effusion, probable  empyema.  POSTOPERATIVE DIAGNOSIS: Complex large left pleural effusion, probable  empyema.  SURGICAL PROCEDURE: Bronchoscopy, left video-assisted thoracoscopy,  mini thoracotomy, decortication of the left lung and drainage of  empyema.  SURGEON: Sheliah Plane, MD  History of Present Illness:     Patient returns for followup after recent admission by pulmonology because of respiratory failure, sepsis, right lung mass, left effusion and left empyema. At the end of October she underwent drainage of left pleural effusion/empyema and was continued on antibiotics for extensive lower lobe pneumonia and atelectasis. Cultures were negative at the time of surgery, pathology did not reveal any malignancy in the material removed from the left chest. Preop CT scan of the chest showed a right lower lobe lung nodule that has not been evaluated.  Patient returns to the office today for postop evaluation after drainage of empyema. She was seen last week and a followup CT scan was scheduled for today. Since she was last seen she continues to improve. He's had no fever chills, no night sweats. She is increasing her physical activity appropriately. She remains at skilled nursing facility.  She was discharged home by the pulmonary service on Augmentin. The nursing home was questioning how long this should be continued.  History  Smoking status  . Former Smoker  . Types: Cigarettes  Smokeless tobacco  . Not on file       Allergies  Allergen Reactions  . Latex     Current Outpatient Prescriptions  Medication Sig Dispense Refill  . ALPRAZolam (XANAX) 0.5 MG tablet Take 1 tablet (0.5 mg total) by mouth at bedtime as needed  for sleep.  30 tablet  5  . clonazePAM (KLONOPIN) 0.5 MG tablet Take 1 tablet (0.5 mg total) by mouth 2 (two) times daily.  60 tablet  5  . oxyCODONE (ROXICODONE) 15 MG immediate release tablet Take 1 tablet (15 mg total) by mouth 3 (three) times daily.  90 tablet  0  . oxyCODONE-acetaminophen (PERCOCET/ROXICET) 5-325 MG per tablet Take one or two tablets by mouth every 4 hours as needed for severe pain  360 tablet  0   No current facility-administered medications for this visit.       Physical Exam: BP 117/68  Pulse 66  Resp 16  Ht 5\' 4"  (1.626 m)  Wt 131 lb 9.6 oz (59.693 kg)  BMI 22.58 kg/m2  SpO2 95%  General appearance: alert and cooperative Neurologic: intact Heart: regular rate and rhythm, S1, S2 normal, no murmur, click, rub or gallop Lungs: diminished breath sounds LLL Abdomen: soft, non-tender; bowel sounds normal; no masses,  no organomegaly Extremities: extremities normal, atraumatic, no cyanosis or edema and Homans sign is negative, no sign of DVT Wound: The left chest incision and chest tube sites are well-healed   Diagnostic Studies & Laboratory data:         Recent Radiology Findings: Ct Chest W Contrast  11/16/2013   CLINICAL DATA:  Pleural effusion, abnormal chest x-ray, history hypertension, breast implants ; history of prior exam indicates follow-up empyema  EXAM: CT CHEST WITH CONTRAST  TECHNIQUE: Multidetector CT imaging of the chest was performed during intravenous contrast administration. Sagittal and coronal MPR images reconstructed from axial data  set.  CONTRAST:  80mL OMNIPAQUE IOHEXOL 300 MG/ML  SOLN  COMPARISON:  10/30/2013  Correlation:  Chest radiograph 11/12/2013  FINDINGS: Atherosclerotic calcifications aorta without aneurysm or dissection on nondedicated exam.  Mitral annular calcification present.  Large hiatal hernia.  Visualized portion of upper abdomen unremarkable.  Bilateral breast prostheses, demonstrating capsular calcification greater on  right.  No thoracic adenopathy.  Partially loculated pleural effusion with pleural thickening at the inferior left hemi thorax, decreased in size since previous exam, by history, patient underwent VATS on 10/23/2013 and per history of prior CT had an empyema.  Associated atelectasis in left lower lobe.  Lobulated 13 x 12 x 11 mm nodule superior segment right lower lobe, previously 13 x 12 x 10 mm.  Mild compressive atelectasis of right lower lobe by large hiatal hernia.  No additional pulmonary mass or nodule.  No pneumothorax.  Fracture lateral left 6th rib image 31.  Bones appear demineralized with scattered degenerative disc disease changes and a mild kyphosis of the mid thoracic spine.  IMPRESSION: Decrease in loculated pleural effusion in atelectasis at left base since previous exam ; these changes likely reflect sequela of prior VATS and thoracostomy tubes as well as empyema.  Slightly improved aeration at left lung base since previous exam.  Stable size of 13 x 12 x 11 mm diameter lobulated soft tissue nodule in the superior segment of the left lower lobe.   Electronically Signed   By: Ulyses Southward M.D.   On: 11/16/2013 17:03      Recent Labs: Lab Results  Component Value Date   WBC 16.8* 10/30/2013   HGB 10.7* 10/30/2013   HCT 32.0* 10/30/2013   PLT 268 10/30/2013   GLUCOSE 111* 10/30/2013   ALT 16 10/26/2013   AST 19 10/26/2013   NA 131* 10/30/2013   K 3.2* 10/30/2013   CL 92* 10/30/2013   CREATININE 0.64 10/30/2013   BUN 9 10/30/2013   CO2 30 10/30/2013   INR 1.36 10/22/2013      Assessment / Plan:   #1 Persistent left pleural effusion on chest x-ray, without active evidence of infection #2 right lower lobe lung nodule suspicious for malignancy in  long-term smoker but no evaluation yet.  To further evaluate the right lower lobe lung nodule a PET scan will be arranged The patient has been on by mouth antibiotics since discharge on November 7, these can be discontinued at this point.  I'll  plan to see her back in the next one to 2 weeks after the PET scan is completed.  The patient would be an extremely poor operative candidate considering her current medical status, but if the right lung nodule was a malignancy stereotactic radiotherapy could be considered.    Addyson Traub B 11/17/2013 2:11 PM

## 2013-11-19 LAB — FUNGUS CULTURE W SMEAR

## 2013-11-25 ENCOUNTER — Non-Acute Institutional Stay (SKILLED_NURSING_FACILITY): Payer: Medicare Other | Admitting: Internal Medicine

## 2013-11-25 DIAGNOSIS — J869 Pyothorax without fistula: Secondary | ICD-10-CM

## 2013-11-25 DIAGNOSIS — M545 Low back pain: Secondary | ICD-10-CM

## 2013-11-26 ENCOUNTER — Other Ambulatory Visit: Payer: Self-pay | Admitting: *Deleted

## 2013-11-26 ENCOUNTER — Ambulatory Visit: Payer: Medicare Other | Admitting: Internal Medicine

## 2013-11-26 ENCOUNTER — Ambulatory Visit (HOSPITAL_COMMUNITY)
Admit: 2013-11-26 | Discharge: 2013-11-26 | Disposition: A | Discharge status: Home / Self Care | Payer: No Typology Code available for payment source | Source: Skilled Nursing Facility | Attending: Internal Medicine | Admitting: Internal Medicine

## 2013-11-26 DIAGNOSIS — M51379 Other intervertebral disc degeneration, lumbosacral region without mention of lumbar back pain or lower extremity pain: Secondary | ICD-10-CM | POA: Insufficient documentation

## 2013-11-26 DIAGNOSIS — M47817 Spondylosis without myelopathy or radiculopathy, lumbosacral region: Secondary | ICD-10-CM | POA: Insufficient documentation

## 2013-11-26 DIAGNOSIS — M5137 Other intervertebral disc degeneration, lumbosacral region: Secondary | ICD-10-CM | POA: Insufficient documentation

## 2013-11-26 DIAGNOSIS — M4120 Other idiopathic scoliosis, site unspecified: Secondary | ICD-10-CM | POA: Insufficient documentation

## 2013-11-26 DIAGNOSIS — M545 Low back pain, unspecified: Secondary | ICD-10-CM | POA: Insufficient documentation

## 2013-11-26 MED ORDER — OXYCODONE-ACETAMINOPHEN 5-325 MG PO TABS: ORAL_TABLET | ORAL | Status: DC

## 2013-11-27 ENCOUNTER — Non-Acute Institutional Stay (SKILLED_NURSING_FACILITY): Payer: Medicare Other | Admitting: Internal Medicine

## 2013-11-27 DIAGNOSIS — I10 Essential (primary) hypertension: Secondary | ICD-10-CM

## 2013-11-27 DIAGNOSIS — J9600 Acute respiratory failure, unspecified whether with hypoxia or hypercapnia: Secondary | ICD-10-CM

## 2013-11-27 DIAGNOSIS — R222 Localized swelling, mass and lump, trunk: Secondary | ICD-10-CM

## 2013-11-27 DIAGNOSIS — F329 Major depressive disorder, single episode, unspecified: Secondary | ICD-10-CM

## 2013-11-27 DIAGNOSIS — E871 Hypo-osmolality and hyponatremia: Secondary | ICD-10-CM

## 2013-11-27 DIAGNOSIS — R918 Other nonspecific abnormal finding of lung field: Secondary | ICD-10-CM

## 2013-11-27 DIAGNOSIS — J90 Pleural effusion, not elsewhere classified: Secondary | ICD-10-CM

## 2013-11-29 NOTE — Progress Notes (Signed)
Patient ID: Rachel Dodson, female   DOB: 1946-09-04, 67 y.o.   MRN: [id number] Facility; [facility] This is a discharge note   History;   This is a 67 year old previously healthy woman who is an ex-smoker quitting 4 months ago. was she was unwell for the last month prior to admission with some intermittent cough shortness of breath complaints of generalized fatigue and back pain. She also really presented to the hospital where an x-ray showed a large left pleural effusion he was followed by a CT scan of the chest which showed moderate loculated left pleural effusion and a 40 mm superior segment right lower lobe pulmonary nodule. She was started on broad-spectrum antibiotics. cultures of the pleural fluid were negative for CNS, acid-fast bacilli, anaerobes and fungus.. pleural biopsy was done that was negative for malignancy the patient had a Vats procedure. She also had chest tubes which were just pulled out before transfer here. Chest x-ray on November 7 showed no acute process. She was diagnosed as having an empyema although her fluid white count was only 4000. The vats procedure she went into respiratory failure and was monitored by critical care medicine. He was also felt to have a urine culture with Escherichia coli blood cultures were apparently negative  Her stay here has been marked at times by some periods of hypotension but this appears to be improved her Lasix was decreased she also had low sodium which has improved with the decrease in Lasix.  She says she is doing better-she has been seen recently by pulmonology with recommendation for a PET scan to followup on the right lobe nodule- pulmonology will be following this.  She does not complaining of any dizziness headache or shortness of breath or chest pain-she will be going home apparently she has numerous family members and friends who will be involved with her--she has reached her therapy goals here    has a past medical history of  Hypertension; Depression; and Anxiety.    Past Surgical History   Procedure  Laterality  Date   .  Abdominal hysterectomy       .  Video bronchoscopy  N/A  10/23/2013       Procedure: VIDEO BRONCHOSCOPY;  Surgeon: Delight Ovens, MD;  Location: [facility] OR;  Service: Thoracic;  Laterality: N/A;   .  Video assisted thoracoscopy  Left  10/23/2013       Procedure: VIDEO ASSISTED THORACOSCOPY;  Surgeon: Delight Ovens, MD;  Location: [facility] OR;  Service: Thoracic;  Laterality: Left;      Current medications albuterol nebulizers every 2 when necessar, ferrous fumarate one capsule 3 times daily, Lasix 20 mg daily, Zofran 4 mg every 8 when necessary, Percocet 05/325-2 tabs every 6 hours when necessary.  OxyContin controlled release 50 mg twice a day routine.  , , Xanax 0.5 mg each bedtime when necessary, Norvasc 5 mg every other day, losartan 50 mg a day, Klonopin 0.5 twice a day, Cymbalta 60 daily, Cozaar 50 mg daily, prilosec 20 mg twice a day Bisoprolol 5 mg QD,   Social history; pa she was previously independent lady living in her own rental house just outside [place]. She was independent with ADLs and IADLs no prior use of ambulatory assist devices. Quit smoking 4 months ago  reports that she has quit smoking. Her smoking use included Cigarettes.. She does not have any smokeless tobacco history on file. She reports that she does not drink alcohol or use illicit drugs.  Family history; none of relevance   Review of systems Gen. no complaints of fever or chills.  Skin-does not complaining a rash or itching.   head ears eyes nose mouth throat-does not complaining of any visual changes-hearing changes-or sore throat.   Respiratory; patient states she is not coughing and does not feel short of breath Cardiac she does not have exertional chest pain  Abdomen; no nausea vomiting or diarrhea Muscle skeletal-has complained of chronic back pain x-ray recently ordered showed degenerative  changes lumbar spine-Dr. Leanord Hawking has just recently made medication changes Neurologic-no complaints of dizziness or headache or syncopal-type feelings Mental status; hx depression this appears to be somewhat improved she is on Cymbalta   Physical examination Temperature 98.2 pulse 70 respirations 18 blood pressure 123/67 weight is stable at 133 Gen.; This is a pleasant elderly female in no distress  Skin is warm and dry.  Do not left thorax area surgical site appears well healed no sign of infection.  Eyes pupils. equal round reactive to light sclera and conjunctiva are clear visual acuity appears intact.  Oropharynx is clear mucous membranes moist.  Chest is clear to auscultation no labored breathing.   Heart is regular rate and rhythm without murmur gallop or rub.--  Abdomen soft nontender with positive bowel sounds.  Muscle skeletal she is able to ambulate appears without much difficulty at all moves all extremities at baseline strength appears to be intact all 4 extremities.  Neurologic is grossly intact no focal deficits cranial nerves intact speech is clear.  Psych she is pleasant and oriented x3  Labs.  11/16/2013.  Sodium 132 potassium 4.9 BUN 10 creatinine 0.77.  November 09 2013.  WBC 7.6 hemoglobin 10.7 platelets 354.  Sodium 1:30 potassium 5.6 BUN 12 creatinine 0.8.  11/04/2013.  Liver function tests within normal limits except albumin of 2.7.     Impression/plan #1 left-sided pleural effusion diagnosed as an empyema-a PET scan has been ordered by pulmonology they will be following this clinically she appears to be stable  #2 probable COPD--clinically this appears stable she is on when necessary nebulizers #3 depression on Cymbalta--this appears somewhat improved #4 at some point was felt to be fluid overloaded discharged on Lasix this was recently decreased secondary to hyponatremia-nonetheless her weight appears to be stable I do not really see  significant issues right now #5 large hiatal hernia on twice a day ppi--at this point appear stable #6 CT scan showing nodule in the superior segment of the right lower lobe.--As noted pulmonology has ordered a PET scan for followup.  #7 as hyponatremia-we'll update recent metabolic panel before discharge this appears to have improved.  #8-hypertension-this appears stable on current medication she is on Cozaar as well as Bisoprolol-  #9-pain issues-she is complaining of some back pain  x-ray was ordered which showed essentially degenerative disc disease lumbar spine-Dr. Leanord Hawking did make recent pain medication changes this will have to be followed up by primary care provider  #10-anemia-she is on iron we'll update CBC before discharge.  [id number] note greater than 30 minutes spent preparing this discharge summary   .

## 2013-12-01 ENCOUNTER — Ambulatory Visit: Payer: Medicare Other | Admitting: Internal Medicine

## 2013-12-02 ENCOUNTER — Encounter (HOSPITAL_COMMUNITY): Payer: Self-pay

## 2013-12-02 ENCOUNTER — Other Ambulatory Visit: Payer: Self-pay | Admitting: *Deleted

## 2013-12-02 ENCOUNTER — Encounter (HOSPITAL_COMMUNITY)
Admit: 2013-12-02 | Discharge: 2013-12-02 | Disposition: A | Discharge status: Home / Self Care | Payer: Medicare Other | Attending: Cardiothoracic Surgery | Admitting: Cardiothoracic Surgery

## 2013-12-02 DIAGNOSIS — R091 Pleurisy: Secondary | ICD-10-CM | POA: Insufficient documentation

## 2013-12-02 DIAGNOSIS — R911 Solitary pulmonary nodule: Secondary | ICD-10-CM | POA: Insufficient documentation

## 2013-12-02 MED ORDER — FLUDEOXYGLUCOSE F - 18 (FDG) INJECTION
19.0000 | Freq: Once | INTRAVENOUS | Status: AC | PRN
  Administered 2013-12-02: 19 via INTRAVENOUS

## 2013-12-03 ENCOUNTER — Ambulatory Visit (INDEPENDENT_AMBULATORY_CARE_PROVIDER_SITE_OTHER): Payer: Self-pay | Admitting: Cardiothoracic Surgery

## 2013-12-03 ENCOUNTER — Encounter: Payer: Self-pay | Admitting: Cardiothoracic Surgery

## 2013-12-03 VITALS — BP 127/80 | HR 100 | Resp 16 | Ht 64.0 in | Wt 131.0 lb

## 2013-12-03 DIAGNOSIS — Z09 Encounter for follow-up examination after completed treatment for conditions other than malignant neoplasm: Secondary | ICD-10-CM

## 2013-12-03 DIAGNOSIS — R911 Solitary pulmonary nodule: Secondary | ICD-10-CM

## 2013-12-03 DIAGNOSIS — J869 Pyothorax without fistula: Secondary | ICD-10-CM

## 2013-12-03 LAB — GLUCOSE, CAPILLARY: Glucose-Capillary: 76 mg/dL (ref 70–99)

## 2013-12-03 NOTE — Progress Notes (Signed)
Rachel Dodson [facility] Medical Record #[id number] Date of Birth: [birth year]  Joseph Art, DO Cassell Smiles., MD  Chief Complaint:   PostOp Follow Up Visit 10/23/2013  OPERATIVE REPORT  PREOPERATIVE DIAGNOSIS: Complex large left pleural effusion, probable  empyema.  POSTOPERATIVE DIAGNOSIS: Complex large left pleural effusion, probable  empyema.  SURGICAL PROCEDURE: Bronchoscopy, left video-assisted thoracoscopy,  mini thoracotomy, decortication of the left lung and drainage of  empyema.  SURGEON: Rachel Plane, MD  History of Present Illness:     Patient returns for followup after recent discharge  by pulmonology service because of respiratory failure, sepsis, right lung mass, left effusion and left empyema. At the end of October she underwent drainage of left pleural effusion/empyema and was continued on antibiotics for extensive lower lobe pneumonia and atelectasis. Cultures were negative at the time of surgery, pathology did not reveal any malignancy in the material removed from the left chest. Preop CT scan of the chest showed a right lower lobe lung nodule.  Patient returns to the office today after a PET scan was performed to evaluate the right lower lobe lung nodule.  The patient is now been discharged from the pain center, living at home and doing well she's had no recurrent fevers or chills.  History  Smoking status  . Former Smoker  . Types: Cigarettes  Smokeless tobacco  . Not on file        Allergies  Allergen Reactions  . Latex     Current Outpatient Prescriptions  Medication Sig Dispense Refill  . amLODipine (NORVASC) 5 MG tablet Take 5 mg by mouth every other day.       . furosemide (LASIX) 40 MG tablet Take 20 mg by mouth daily.      . ondansetron (ZOFRAN) 4 MG tablet Take 1 tablet (4 mg total) by mouth every 8 (eight) hours as needed for nausea.  20 tablet  0  . oxyCODONE (ROXICODONE) 15 MG immediate release tablet Take 1 tablet  (15 mg total) by mouth 3 (three) times daily.  90 tablet  0  . oxyCODONE-acetaminophen (PERCOCET/ROXICET) 5-325 MG per tablet Take two tablets by mouth every 6 hours as needed for pain  240 tablet  0  . polyethylene glycol (MIRALAX / GLYCOLAX) packet Take 17 g by mouth daily.      Marland Kitchen ALPRAZolam (XANAX) 0.5 MG tablet Take 1 tablet (0.5 mg total) by mouth at bedtime as needed for sleep.  30 tablet  5  . bisoprolol-hydrochlorothiazide (ZIAC) 5-6.25 MG per tablet Take 1 tablet by mouth 2 (two) times daily.      . clonazePAM (KLONOPIN) 0.5 MG tablet Take 1 tablet (0.5 mg total) by mouth 2 (two) times daily.  60 tablet  5  . DULoxetine (CYMBALTA) 60 MG capsule Take 60 mg by mouth daily.      Marland Kitchen losartan (COZAAR) 50 MG tablet Take 50 mg by mouth daily.       No current facility-administered medications for this visit.       Physical Exam: BP 127/80  Pulse 100  Resp 16  Ht 5\' 4"  (1.626 m)  Wt 131 lb (59.421 kg)  BMI 22.47 kg/m2  SpO2 97%  General appearance: alert and cooperative Neurologic: intact Heart: regular rate and rhythm, S1, S2 normal, no murmur, click, rub or gallop Lungs: diminished breath sounds LLL Abdomen: soft, non-tender; bowel sounds normal; no masses,  no organomegaly Extremities: extremities normal, atraumatic, no cyanosis or  edema and Homans sign is negative, no sign of DVT Wound: The left chest incision and chest tube sites are well-healed  No cervical or supraclavicular adenopathy Diagnostic Studies & Laboratory data:          Recent Radiology Findings:  Nm Pet Image Initial (pi) Skull Base To Thigh  12/02/2013   CLINICAL DATA:  Initial treatment strategy for solitary pulmonary nodule. History of empyema.  EXAM: NUCLEAR MEDICINE PET SKULL BASE TO THIGH  FASTING BLOOD GLUCOSE:  Value: 76mg /dl  TECHNIQUE: 40.9 mCi W-11 FDG was injected intravenously. CT data was obtained and used for attenuation correction and anatomic localization only. (This was not acquired as a  diagnostic CT examination.) Additional exam technical data entered on technologist worksheet.  COMPARISON:  CT 11/16/2013, lobe and 06/2013  FINDINGS: NECK  No hypermetabolic lymph nodes in the neck.  CHEST  11 mm right lower lobe pulmonary nodule (image 71) has very low metabolic activity (SUV max 1.0).  There is a moderate metabolic activity associated with the pleural parenchymal thickening in the left lower lobe with multiple with SUV max 3.4. This is felt to be post inflammatory/infectious change from empyema. .  Bilateral breast implants are noted. No hypermetabolic mediastinal lymph nodes. Large hiatal hernia noted.  ABDOMEN/PELVIS  No abnormal hypermetabolic activity within the liver, pancreas, adrenal glands, or spleen. No hypermetabolic lymph nodes in the abdomen or pelvis.  SKELETON  No focal hypermetabolic activity to suggest skeletal metastasis.  IMPRESSION: 1. Low metabolic activity associated with the right lower lobe pulmonary nodule favors benign etiology. Recommend follow-up CT thorax with contrast in 3 months to demonstrate stability. Additional CT follow-up beyond that is also recommended. 2. Metabolic activity associated with the pleural parenchymal thickening at the right lung base is felt to be postinflammatory from empyema and therapy for empyema.   Electronically Signed   By: Rachel Dodson M.D.   On: 12/02/2013 16:26   Ct Chest W Contrast  11/16/2013   CLINICAL DATA:  Pleural effusion, abnormal chest x-ray, history hypertension, breast implants ; history of prior exam indicates follow-up empyema  EXAM: CT CHEST WITH CONTRAST  TECHNIQUE: Multidetector CT imaging of the chest was performed during intravenous contrast administration. Sagittal and coronal MPR images reconstructed from axial data set.  CONTRAST:  80mL OMNIPAQUE IOHEXOL 300 MG/ML  SOLN  COMPARISON:  10/30/2013  Correlation:  Chest radiograph 11/12/2013  FINDINGS: Atherosclerotic calcifications aorta without aneurysm or  dissection on nondedicated exam.  Mitral annular calcification present.  Large hiatal hernia.  Visualized portion of upper abdomen unremarkable.  Bilateral breast prostheses, demonstrating capsular calcification greater on right.  No thoracic adenopathy.  Partially loculated pleural effusion with pleural thickening at the inferior left hemi thorax, decreased in size since previous exam, by history, patient underwent VATS on 10/23/2013 and per history of prior CT had an empyema.  Associated atelectasis in left lower lobe.  Lobulated 13 x 12 x 11 mm nodule superior segment right lower lobe, previously 13 x 12 x 10 mm.  Mild compressive atelectasis of right lower lobe by large hiatal hernia.  No additional pulmonary mass or nodule.  No pneumothorax.  Fracture lateral left 6th rib image 31.  Bones appear demineralized with scattered degenerative disc disease changes and a mild kyphosis of the mid thoracic spine.  IMPRESSION: Decrease in loculated pleural effusion in atelectasis at left base since previous exam ; these changes likely reflect sequela of prior VATS and thoracostomy tubes as well as empyema.  Slightly improved aeration  at left lung base since previous exam.  Stable size of 13 x 12 x 11 mm diameter lobulated soft tissue nodule in the superior segment of the left lower lobe.   Electronically Signed   By: Rachel Dodson M.D.   On: 11/16/2013 17:03      Recent Labs: Lab Results  Component Value Date   WBC 16.8* 10/30/2013   HGB 10.7* 10/30/2013   HCT 32.0* 10/30/2013   PLT 268 10/30/2013   GLUCOSE 111* 10/30/2013   ALT 16 10/26/2013   AST 19 10/26/2013   NA 131* 10/30/2013   K 3.2* 10/30/2013   CL 92* 10/30/2013   CREATININE 0.64 10/30/2013   BUN 9 10/30/2013   CO2 30 10/30/2013   INR 1.36 10/22/2013      Assessment / Plan:   #1 Persistent left pleural effusion on chest x-ray, without active evidence of infection #2 right lower lobe lung nodule appears to have decreased in size from 1.4  To 1 cm. The  nodule is not hypermetabolic  I'll plan to see her back in the next one to 3 months for a repeat CT scan of the chest specifically to look at the size of the right lower lobe lung nodule   Ova Meegan B 12/03/2013 2:37 PM

## 2013-12-05 LAB — AFB CULTURE WITH SMEAR (NOT AT ARMC)
Acid Fast Smear: NONE SEEN
Acid Fast Smear: NONE SEEN

## 2013-12-07 NOTE — Progress Notes (Addendum)
Patient ID: Rachel FERGESON, female   DOB: [birth year], 67 y.o.   MRN: [id number]           PROGRESS NOTE  DATE:  11/25/2013  FACILITY: [facility]    LEVEL OF CARE:   SNF   Acute Visit   CHIEF COMPLAINT:  Review of general medical issues.    HISTORY OF PRESENT ILLNESS:   This is a patient who came to [place] after being treated for a left pleural effusion.  She had chest tubes and ultimately underwent a VATS procedure.  Cultures of this were negative.  Pleural biopsy was done that was negative for malignancy.  She has been discovered to have a superior segment right lower lobe nodule and has been referred to  and I believe is going for a PET scan for evaluation of this tomorrow.    With regards to other issues, the patient has done fairly well here.  However, she is still on a considerable amount of narcotics, oxycodone 15 mg three times daily routinely and p.r.n. oxycodone 5/325, two at least three times a day.  She tells me that she is taking this for low back pain, which she describes as osteoporosis.  I have looked through [facility].  I do not see anything on this patient with regards to x-rays of the lumbar spine, etc.    REVIEW OF SYSTEMS:   GENERAL:  No fever.   CHEST/RESPIRATORY:  She does not describe shortness of breath or cough.   CARDIAC:   No chest pain.   MUSCULOSKELETAL:  Severe low back pain.  Looking through my admission records did not make any reference to this.    PHYSICAL EXAMINATION:   GENERAL APPEARANCE:  The patient looks to be in no distress.   CHEST/RESPIRATORY:  A few crackles in the left lower lobe.  However, no wheezing.  Air entry is reduced bilaterally.  However, her status seems stable.   CARDIOVASCULAR:  CARDIAC:   Heart sounds are normal.  There are no murmurs.  She appears to be euvolemic.   MUSCULOSKELETAL:  No local tenderness is noted.    ASSESSMENT/PLAN:  Low back pain.  This lady is going home  apparently in three days' time.  I am  going to try to adjust her narcotics.  I am also going to get a lower back x-ray, lumbosacral spine.    Empyema.  This appears to be resolved.  She has been back to see her thoracic surgeon.  She is now going for a PET scan for work-up of her pulmonary nodule in the right lower lobe.    CPT CODE: 40981    ADDENDUM:  I do not see an obvious reason for her to be in the facility at this point.  She is already ambulatory.

## 2013-12-09 ENCOUNTER — Ambulatory Visit: Payer: Medicare Other | Admitting: Internal Medicine

## 2013-12-28 ENCOUNTER — Other Ambulatory Visit (HOSPITAL_BASED_OUTPATIENT_CLINIC_OR_DEPARTMENT_OTHER): Payer: Self-pay | Admitting: Internal Medicine

## 2014-01-01 ENCOUNTER — Ambulatory Visit: Payer: Medicare Other | Admitting: Internal Medicine

## 2014-01-08 ENCOUNTER — Encounter: Payer: Self-pay | Admitting: Internal Medicine

## 2014-01-08 ENCOUNTER — Ambulatory Visit: Payer: Medicare Other | Admitting: Internal Medicine

## 2014-01-08 ENCOUNTER — Ambulatory Visit (INDEPENDENT_AMBULATORY_CARE_PROVIDER_SITE_OTHER): Payer: Medicare Other | Admitting: Internal Medicine

## 2014-01-08 VITALS — BP 94/62 | HR 75 | Temp 97.9°F | Ht 61.5 in | Wt 142.0 lb

## 2014-01-08 DIAGNOSIS — R222 Localized swelling, mass and lump, trunk: Secondary | ICD-10-CM

## 2014-01-08 DIAGNOSIS — J449 Chronic obstructive pulmonary disease, unspecified: Secondary | ICD-10-CM

## 2014-01-08 DIAGNOSIS — J441 Chronic obstructive pulmonary disease with (acute) exacerbation: Secondary | ICD-10-CM | POA: Insufficient documentation

## 2014-01-08 DIAGNOSIS — R918 Other nonspecific abnormal finding of lung field: Secondary | ICD-10-CM

## 2014-01-08 NOTE — Progress Notes (Addendum)
Subjective:     Patient ID: Rachel Dodson, female   DOB: 08/28/1946  MRN: [id number]  HPI  66 yowf worked in [place]  quit smoking around summertime 2014 s resp sequelae f/u by Gerarda Fraction  Notes: Original Note by Rhodia Albright, NP (Nurse Practitioner) filed at 10/30/2013 12:56 PM    Physician Discharge Summary    Patient ID:  Rachel Dodson  MRN: [id number]  DOB/AGE: Jun 17, 1946 68 y.o.  Admit date: 10/20/2013  Discharge date: 10/30/2013  Problem List  Active Problems:  Pleural effusion  Lung mass  UTI (urinary tract infection)  Sepsis  Hypotension, unspecified  Leukocytosis, unspecified  Acute respiratory failure  HPI:  68 year old female who came to the ED with a chief complaint of weakness and ongoing shortness of breath, back pain dizziness. Patient says that she fell in the shower 3 days ago. She also admits to having some chest pain associated with shortness of breath. Patient lives by herself, patient is a former smoker. In the ED chest x-ray was done which showed large effusion on the left side and it was followed by CT chest which showed moderate loculated left pleural effusion and appearance of 40 mm superior segment right lower lobe pulmonary nodule rare the possibility of malignant effusion. The possibility of nodule representing primary tumor, bronchogenic carcinoma.  Hospital Course:  SIGNIFICANT EVENTS:  10/28 Admit  10/31 Lt VATS decortication  STUDIES:  10/28 CT chest >> Lt loculated effusion, 14 mm nodule RLL, large hiatal hernia  10/30 CT abd/pelvis >> no acute process  11/07 CT chest >> no acute process  LINES / TUBES:  10-31 OTT>>10/31  10-31 rt  cvl>>11-7  10-31 lt rad aline>>out  10-31 lt c t x 2>>1 posterior remains>>>11-6  CULTURES:  10-30 pleural fluid>>  Neg afb  Neg yeast  10-28 uc>>ecoli ss cipro  10-30 bc x 2>>not done  PATH:  Cytology neg  ANTIBIOTICS:  10-31 cipro>> 11/1  11/2 vanc >>11-4  11/2 imi >>11-4  11-4 roc>>11-7  11-7 Augmentin>> x 5  days dc date 11-12   ASSESSMENT / PLAN:  A:  Lt empyema s/p VATS.  RLL pulmonary nodule.  P:  -continue Abx  -f/u CXR  -oxygen to keep SpO2 > 92%  -bronchial hygiene  -prn BD's  -f/u CT chest 11/7 no acute process  A:  Hx of HTN.  Hypervolemia.  P:  -KVO fluids  -continue lasix for goal negative fluid balance  -d/c CVL  -continue lopressor  A: Anemia of critical illness.  P:  -f/u CBC  -SQ heparin for DVT prevention  A:  Post-op pain control.  Anxiety.  Deconditioning.  P:  -prn tylenol, percocet  -cymbalta, klonopin  -SNF placement     01/08/2014 post hosp f/u  ov/Jashawna Reever re: L empyema/ R SPN Chief Complaint  Patient presents with  . HFU    Pt states that her breathing is doing well. She c/o occ discomort on her left side.    Not limited by breathing but rather by weak and dizzy but overall making steady progress since got out of SNF, not on any resp meds at all.  No obvious day to day or daytime variabilty or assoc chronic cough or chest tightness, subjective wheeze overt sinus or hb symptoms. No unusual exp hx or h/o childhood pna/ asthma or knowledge of premature birth.  Sleeping ok without nocturnal  or early am exacerbation  of respiratory  c/o's or need for noct saba. Also denies any obvious  fluctuation of symptoms with weather or environmental changes or other aggravating or alleviating factors except as outlined above   Current Medications, Allergies, Complete Past Medical History, Past Surgical History, Family History, and Social History were reviewed in [employer] record.  ROS  The following are not active complaints unless bolded sore throat, dysphagia, dental problems, itching, sneezing,  nasal congestion or excess/ purulent secretions, ear ache,   fever, chills, sweats, unintended wt loss, pleuritic or exertional cp, hemoptysis,  orthopnea pnd or leg swelling, presyncope, palpitations, heartburn, abdominal pain, anorexia, nausea,  vomiting, diarrhea  or change in bowel or urinary habits, change in stools or urine, dysuria,hematuria,  rash, arthralgias, visual complaints, headache, numbness weakness or ataxia or problems with walking or coordination,  change in mood/affect or memory.        Review of Systems     Objective:   Physical Exam amb wf nad  Wt Readings from Last 3 Encounters:  01/08/14 142 lb (64.411 kg)  12/03/13 131 lb (59.421 kg)  11/17/13 131 lb 9.6 oz (59.693 kg)       HEENT: nl dentition, turbinates, and orophanx. Nl external ear canals without cough reflex   NECK :  without JVD/Nodes/TM/ nl carotid upstrokes bilaterally   LUNGS: no acc muscle use, clear to A and P bilaterally without cough on insp or exp maneuvers   CV:  RRR  no s3 or murmur or increase in P2, no edema   ABD:  soft and nontender with nl excursion in the supine position. No bruits or organomegaly, bowel sounds nl  MS:  warm without deformities, calf tenderness, cyanosis or clubbing  SKIN: warm and dry without lesions    NEURO:  alert, approp, no deficits    11/16/13 Decrease in loculated pleural effusion in atelectasis at left base  since previous exam ; these changes likely reflect sequela of prior  VATS and thoracostomy tubes as well as empyema.  Slightly improved aeration at left lung base since previous exam.  Stable size of 13 x 12 x 11 mm diameter lobulated soft tissue nodule  in the superior segment of the left lower lobe.          Assessment:

## 2014-01-08 NOTE — Patient Instructions (Addendum)
No change in medications - I will let Dr Servando Snare know about your lung function tests to help plan what to do about the right lung nodule  Pulmonary follow up is as needed

## 2014-01-10 NOTE — Assessment & Plan Note (Addendum)
-    Spirometry 01/08/2014   FEV1  1.20 (59%) ratio 66   At this point no active cough or limiting sob and most of her problem is restrictive from kyphosis, HH and Scarring on L s/p vats for empyema She does not have much in the way of AB to reverse but so we eill check her back here prn

## 2014-01-25 ENCOUNTER — Inpatient Hospital Stay (HOSPITAL_COMMUNITY)
Admission: EM | Admit: 2014-01-25 | Discharge: 2014-01-31 | DRG: 682 | Disposition: A | Discharge status: Home Health | Payer: Medicare Other | Attending: Family Medicine | Admitting: Family Medicine

## 2014-01-25 ENCOUNTER — Encounter (HOSPITAL_COMMUNITY): Payer: Self-pay | Admitting: Emergency Medicine

## 2014-01-25 ENCOUNTER — Other Ambulatory Visit: Payer: Self-pay

## 2014-01-25 ENCOUNTER — Emergency Department (HOSPITAL_COMMUNITY): Payer: Medicare Other

## 2014-01-25 DIAGNOSIS — F329 Major depressive disorder, single episode, unspecified: Secondary | ICD-10-CM | POA: Diagnosis present

## 2014-01-25 DIAGNOSIS — E669 Obesity, unspecified: Secondary | ICD-10-CM | POA: Diagnosis present

## 2014-01-25 DIAGNOSIS — F3289 Other specified depressive episodes: Secondary | ICD-10-CM | POA: Diagnosis present

## 2014-01-25 DIAGNOSIS — J329 Chronic sinusitis, unspecified: Secondary | ICD-10-CM

## 2014-01-25 DIAGNOSIS — N179 Acute kidney failure, unspecified: Principal | ICD-10-CM | POA: Diagnosis present

## 2014-01-25 DIAGNOSIS — Z87891 Personal history of nicotine dependence: Secondary | ICD-10-CM

## 2014-01-25 DIAGNOSIS — Z823 Family history of stroke: Secondary | ICD-10-CM

## 2014-01-25 DIAGNOSIS — F411 Generalized anxiety disorder: Secondary | ICD-10-CM | POA: Diagnosis present

## 2014-01-25 DIAGNOSIS — G9340 Encephalopathy, unspecified: Secondary | ICD-10-CM | POA: Diagnosis not present

## 2014-01-25 DIAGNOSIS — N19 Unspecified kidney failure: Secondary | ICD-10-CM

## 2014-01-25 DIAGNOSIS — R197 Diarrhea, unspecified: Secondary | ICD-10-CM | POA: Diagnosis not present

## 2014-01-25 DIAGNOSIS — Z79899 Other long term (current) drug therapy: Secondary | ICD-10-CM

## 2014-01-25 DIAGNOSIS — Z6825 Body mass index (BMI) 25.0-25.9, adult: Secondary | ICD-10-CM

## 2014-01-25 DIAGNOSIS — R7309 Other abnormal glucose: Secondary | ICD-10-CM | POA: Diagnosis present

## 2014-01-25 DIAGNOSIS — Y92009 Unspecified place in unspecified non-institutional (private) residence as the place of occurrence of the external cause: Secondary | ICD-10-CM

## 2014-01-25 DIAGNOSIS — M6282 Rhabdomyolysis: Secondary | ICD-10-CM | POA: Diagnosis present

## 2014-01-25 DIAGNOSIS — E876 Hypokalemia: Secondary | ICD-10-CM | POA: Diagnosis present

## 2014-01-25 DIAGNOSIS — R51 Headache: Secondary | ICD-10-CM | POA: Diagnosis present

## 2014-01-25 DIAGNOSIS — E860 Dehydration: Secondary | ICD-10-CM | POA: Diagnosis present

## 2014-01-25 DIAGNOSIS — I10 Essential (primary) hypertension: Secondary | ICD-10-CM | POA: Diagnosis present

## 2014-01-25 DIAGNOSIS — W19XXXA Unspecified fall, initial encounter: Secondary | ICD-10-CM | POA: Diagnosis present

## 2014-01-25 DIAGNOSIS — F0390 Unspecified dementia without behavioral disturbance: Secondary | ICD-10-CM | POA: Diagnosis present

## 2014-01-25 DIAGNOSIS — E861 Hypovolemia: Secondary | ICD-10-CM | POA: Diagnosis present

## 2014-01-25 LAB — URINE MICROSCOPIC-ADD ON

## 2014-01-25 LAB — GLUCOSE, CAPILLARY: Glucose-Capillary: 152 mg/dL — ABNORMAL HIGH (ref 70–99)

## 2014-01-25 LAB — PROTIME-INR
INR: 0.97 (ref 0.00–1.49)
Prothrombin Time: 12.7 seconds (ref 11.6–15.2)

## 2014-01-25 LAB — COMPREHENSIVE METABOLIC PANEL
ALT: 32 U/L (ref 0–35)
AST: 113 U/L — ABNORMAL HIGH (ref 0–37)
Albumin: 4.3 g/dL (ref 3.5–5.2)
Alkaline Phosphatase: 100 U/L (ref 39–117)
BILIRUBIN TOTAL: 0.3 mg/dL (ref 0.3–1.2)
BUN: 66 mg/dL — AB (ref 6–23)
CHLORIDE: 89 meq/L — AB (ref 96–112)
CO2: 20 meq/L (ref 19–32)
Calcium: 9.7 mg/dL (ref 8.4–10.5)
Creatinine, Ser: 4.84 mg/dL — ABNORMAL HIGH (ref 0.50–1.10)
GFR calc Af Amer: 10 mL/min — ABNORMAL LOW (ref >90)
GFR, EST NON AFRICAN AMERICAN: 8 mL/min — AB (ref >90)
Glucose, Bld: 172 mg/dL — ABNORMAL HIGH (ref 70–99)
Potassium: 4.4 mEq/L (ref 3.7–5.3)
Sodium: 131 mEq/L — ABNORMAL LOW (ref 137–147)
Total Protein: 8.2 g/dL (ref 6.0–8.3)

## 2014-01-25 LAB — LACTIC ACID, PLASMA: Lactic Acid, Venous: 2.5 mmol/L — ABNORMAL HIGH (ref 0.5–2.2)

## 2014-01-25 LAB — CBC WITH DIFFERENTIAL/PLATELET
BASOS ABS: 0 10*3/uL (ref 0.0–0.1)
Basophils Relative: 0 % (ref 0–1)
EOS ABS: 0 10*3/uL (ref 0.0–0.7)
EOS PCT: 0 % (ref 0–5)
HCT: 41.1 % (ref 36.0–46.0)
Hemoglobin: 14.8 g/dL (ref 12.0–15.0)
LYMPHS ABS: 0.5 10*3/uL — AB (ref 0.7–4.0)
LYMPHS PCT: 6 % — AB (ref 12–46)
MCH: 31.5 pg (ref 26.0–34.0)
MCHC: 36 g/dL (ref 30.0–36.0)
MCV: 87.4 fL (ref 78.0–100.0)
Monocytes Absolute: 0.5 10*3/uL (ref 0.1–1.0)
Monocytes Relative: 6 % (ref 3–12)
NEUTROS PCT: 88 % — AB (ref 43–77)
Neutro Abs: 7.4 10*3/uL (ref 1.7–7.7)
PLATELETS: 199 10*3/uL (ref 150–400)
RBC: 4.7 MIL/uL (ref 3.87–5.11)
RDW: 12.3 % (ref 11.5–15.5)
WBC: 8.4 10*3/uL (ref 4.0–10.5)

## 2014-01-25 LAB — URINALYSIS, ROUTINE W REFLEX MICROSCOPIC
Bilirubin Urine: NEGATIVE
Glucose, UA: NEGATIVE mg/dL
KETONES UR: NEGATIVE mg/dL
Nitrite: NEGATIVE
PROTEIN: NEGATIVE mg/dL
Specific Gravity, Urine: 1.025 (ref 1.005–1.030)
Urobilinogen, UA: 0.2 mg/dL (ref 0.0–1.0)
pH: 5.5 (ref 5.0–8.0)

## 2014-01-25 LAB — LIPASE, BLOOD: Lipase: 21 U/L (ref 11–59)

## 2014-01-25 LAB — CK: Total CK: 5699 U/L — ABNORMAL HIGH (ref 7–177)

## 2014-01-25 LAB — TROPONIN I

## 2014-01-25 LAB — PRO B NATRIURETIC PEPTIDE: PRO B NATRI PEPTIDE: 3614 pg/mL — AB (ref 0–125)

## 2014-01-25 MED ORDER — ACETAMINOPHEN 325 MG PO TABS
650.0000 mg | ORAL_TABLET | Freq: Four times a day (QID) | ORAL | Status: DC | PRN
  Administered 2014-01-26 – 2014-01-31 (×12): 650 mg via ORAL
  Filled 2014-01-25 (×12): qty 2

## 2014-01-25 MED ORDER — ONDANSETRON HCL 4 MG/2ML IJ SOLN
4.0000 mg | Freq: Once | INTRAMUSCULAR | Status: AC
  Administered 2014-01-25: 4 mg via INTRAVENOUS
  Filled 2014-01-25: qty 2

## 2014-01-25 MED ORDER — ALBUTEROL SULFATE (2.5 MG/3ML) 0.083% IN NEBU: 2.5000 mg | INHALATION_SOLUTION | RESPIRATORY_TRACT | Status: DC | PRN

## 2014-01-25 MED ORDER — ONDANSETRON HCL 4 MG PO TABS
4.0000 mg | ORAL_TABLET | Freq: Four times a day (QID) | ORAL | Status: DC | PRN
  Administered 2014-01-28: 4 mg via ORAL
  Filled 2014-01-25: qty 1

## 2014-01-25 MED ORDER — ONDANSETRON HCL 4 MG/2ML IJ SOLN
4.0000 mg | Freq: Four times a day (QID) | INTRAMUSCULAR | Status: DC | PRN
  Administered 2014-01-29 – 2014-01-31 (×3): 4 mg via INTRAVENOUS
  Filled 2014-01-25 (×3): qty 2

## 2014-01-25 MED ORDER — ALPRAZOLAM 0.5 MG PO TABS
0.5000 mg | ORAL_TABLET | Freq: Every evening | ORAL | Status: DC | PRN
  Administered 2014-01-27 – 2014-01-30 (×5): 0.5 mg via ORAL
  Filled 2014-01-25 (×5): qty 1

## 2014-01-25 MED ORDER — SODIUM CHLORIDE 0.9 % IV SOLN: INTRAVENOUS | Status: DC

## 2014-01-25 MED ORDER — SODIUM CHLORIDE 0.9 % IJ SOLN
3.0000 mL | Freq: Two times a day (BID) | INTRAMUSCULAR | Status: DC
  Administered 2014-01-25 – 2014-01-31 (×5): 3 mL via INTRAVENOUS

## 2014-01-25 MED ORDER — HEPARIN SODIUM (PORCINE) 5000 UNIT/ML IJ SOLN
5000.0000 [IU] | Freq: Three times a day (TID) | INTRAMUSCULAR | Status: DC
  Administered 2014-01-25 – 2014-01-31 (×17): 5000 [IU] via SUBCUTANEOUS
  Filled 2014-01-25 (×17): qty 1

## 2014-01-25 MED ORDER — ACETAMINOPHEN 650 MG RE SUPP: 650.0000 mg | Freq: Four times a day (QID) | RECTAL | Status: DC | PRN

## 2014-01-25 MED ORDER — SODIUM CHLORIDE 0.9 % IV BOLUS (SEPSIS)
500.0000 mL | Freq: Once | INTRAVENOUS | Status: AC
  Administered 2014-01-25: 500 mL via INTRAVENOUS

## 2014-01-25 MED ORDER — DULOXETINE HCL 60 MG PO CPEP
60.0000 mg | ORAL_CAPSULE | Freq: Every day | ORAL | Status: DC
  Administered 2014-01-26 – 2014-01-31 (×6): 60 mg via ORAL
  Filled 2014-01-25 (×6): qty 1

## 2014-01-25 MED ORDER — CLONAZEPAM 0.5 MG PO TABS
0.5000 mg | ORAL_TABLET | Freq: Two times a day (BID) | ORAL | Status: DC
  Administered 2014-01-26 – 2014-01-31 (×11): 0.5 mg via ORAL
  Filled 2014-01-25 (×11): qty 1

## 2014-01-25 MED ORDER — SODIUM CHLORIDE 0.9 % IV SOLN
INTRAVENOUS | Status: DC
  Administered 2014-01-25 – 2014-01-27 (×4): via INTRAVENOUS

## 2014-01-25 NOTE — ED Provider Notes (Addendum)
CSN: [id number]     Arrival date & time 01/25/14  1910 History   First MD Initiated Contact with Patient 01/25/14 1911     Chief Complaint  Patient presents with  . Fall   (Consider location/radiation/quality/duration/timing/severity/associated sxs/prior Treatment) Patient is a 68 y.o. female presenting with fall. The history is limited by the condition of the patient.  Fall   patient brought in by EMS. Family had been trying to get in contact with her all day. Wilburn Mylar is when they last spoke with her. Patient has a history of dementia lives alone. Family finally went over there found her on the bathroom floor unknown time of fall. Due to the patient's dementia patient is not sure what occurred or when she fell. Patient complaining of left hip pain and head pain.  Level V caveat applies to the patient has a baseline history of dementia and is confused currently.  Past Medical History  Diagnosis Date  . Hypertension   . Depression   . Anxiety    Past Surgical History  Procedure Laterality Date  . Abdominal hysterectomy    . Video bronchoscopy N/A 10/23/2013    Procedure: VIDEO BRONCHOSCOPY;  Surgeon: Grace Isaac, MD;  Location: [facility];  Service: Thoracic;  Laterality: N/A;  . Video assisted thoracoscopy Left 10/23/2013    Procedure: VIDEO ASSISTED THORACOSCOPY;  Surgeon: Grace Isaac, MD;  Location: [place];  Service: Thoracic;  Laterality: Left;   No family history on file. History  Substance Use Topics  . Smoking status: Former Smoker -- 0.75 packs/day for 15 years    Types: Cigarettes    Quit date: 08/24/2013  . Smokeless tobacco: Never Used  . Alcohol Use: No   OB History   Grav Para Term Preterm Abortions TAB SAB Ect Mult Living                 Review of Systems  Unable to perform ROS  level V caveat applies due to her dementia confusion.  Allergies  Latex  Home Medications   Current Outpatient Rx  Name  Route  Sig  Dispense  Refill  . ALPRAZolam  (XANAX) 0.5 MG tablet   Oral   Take 1 tablet (0.5 mg total) by mouth at bedtime as needed for sleep.   30 tablet   5   . clonazePAM (KLONOPIN) 0.5 MG tablet   Oral   Take 1 tablet (0.5 mg total) by mouth 2 (two) times daily.   60 tablet   5   . DULoxetine (CYMBALTA) 60 MG capsule   Oral   Take 60 mg by mouth daily.         . furosemide (LASIX) 40 MG tablet   Oral   Take 40 mg by mouth daily.          Marland Kitchen losartan (COZAAR) 50 MG tablet   Oral   Take 50 mg by mouth daily.         . ondansetron (ZOFRAN) 4 MG tablet   Oral   Take 1 tablet (4 mg total) by mouth every 8 (eight) hours as needed for nausea.   20 tablet   0   . oxyCODONE (ROXICODONE) 15 MG immediate release tablet   Oral   Take 1 tablet (15 mg total) by mouth 3 (three) times daily.   90 tablet   0   . oxyCODONE-acetaminophen (PERCOCET/ROXICET) 5-325 MG per tablet      Take two tablets by mouth every 6 hours  as needed for pain   240 tablet   0    BP 138/71  Pulse 100  Temp(Src) 96.6 F (35.9 C) (Rectal)  Resp 16  SpO2 97% Physical Exam  Nursing note and vitals reviewed. Constitutional: She appears well-developed and well-nourished. No distress.  HENT:  Head: Normocephalic and atraumatic.  Mouth/Throat: Oropharynx is clear and moist.  Mucous membranes slightly dry.  Eyes: Conjunctivae and EOM are normal. Pupils are equal, round, and reactive to light. No scleral icterus.  Both eyes watery sclera little injected no discharge.  Neck: Normal range of motion. Neck supple.  Cardiovascular: Normal rate, regular rhythm and normal heart sounds.   No murmur heard. Pulmonary/Chest: Effort normal and breath sounds normal. No respiratory distress.  Abdominal: Soft. Bowel sounds are normal. There is no tenderness.  Musculoskeletal: Normal range of motion. She exhibits no edema.  Neurological: No cranial nerve deficit. She exhibits normal muscle tone. Coordination normal.  Awake verbally quiet but will  follow commands.  Skin: Skin is warm. No rash noted.    ED Course  Procedures (including critical care time) Labs Review Labs Reviewed  GLUCOSE, CAPILLARY - Abnormal; Notable for the following:    Glucose-Capillary 152 (*)    All other components within normal limits  COMPREHENSIVE METABOLIC PANEL - Abnormal; Notable for the following:    Sodium 131 (*)    Chloride 89 (*)    Glucose, Bld 172 (*)    BUN 66 (*)    Creatinine, Ser 4.84 (*)    AST 113 (*)    GFR calc non Af Amer 8 (*)    GFR calc Af Amer 10 (*)    All other components within normal limits  CBC WITH DIFFERENTIAL - Abnormal; Notable for the following:    Neutrophils Relative % 88 (*)    Lymphocytes Relative 6 (*)    Lymphs Abs 0.5 (*)    All other components within normal limits  PRO B NATRIURETIC PEPTIDE - Abnormal; Notable for the following:    Pro B Natriuretic peptide (BNP) 3614.0 (*)    All other components within normal limits  CK - Abnormal; Notable for the following:    Total CK 5699 (*)    All other components within normal limits  LIPASE, BLOOD  TROPONIN I  PROTIME-INR  URINALYSIS, ROUTINE W REFLEX MICROSCOPIC  LACTIC ACID, PLASMA   Results for orders placed during the hospital encounter of 01/25/14  GLUCOSE, CAPILLARY      Result Value Range   Glucose-Capillary 152 (*) 70 - 99 mg/dL  COMPREHENSIVE METABOLIC PANEL      Result Value Range   Sodium 131 (*) 137 - 147 mEq/L   Potassium 4.4  3.7 - 5.3 mEq/L   Chloride 89 (*) 96 - 112 mEq/L   CO2 20  19 - 32 mEq/L   Glucose, Bld 172 (*) 70 - 99 mg/dL   BUN 66 (*) 6 - 23 mg/dL   Creatinine, Ser 4.84 (*) 0.50 - 1.10 mg/dL   Calcium 9.7  8.4 - 10.5 mg/dL   Total Protein 8.2  6.0 - 8.3 g/dL   Albumin 4.3  3.5 - 5.2 g/dL   AST 113 (*) 0 - 37 U/L   ALT 32  0 - 35 U/L   Alkaline Phosphatase 100  39 - 117 U/L   Total Bilirubin 0.3  0.3 - 1.2 mg/dL   GFR calc non Af Amer 8 (*) >90 mL/min   GFR calc Af Amer 10 (*) >  90 mL/min  LIPASE, BLOOD      Result  Value Range   Lipase 21  11 - 59 U/L  CBC WITH DIFFERENTIAL      Result Value Range   WBC 8.4  4.0 - 10.5 K/uL   RBC 4.70  3.87 - 5.11 MIL/uL   Hemoglobin 14.8  12.0 - 15.0 g/dL   HCT 41.1  36.0 - 46.0 %   MCV 87.4  78.0 - 100.0 fL   MCH 31.5  26.0 - 34.0 pg   MCHC 36.0  30.0 - 36.0 g/dL   RDW 12.3  11.5 - 15.5 %   Platelets 199  150 - 400 K/uL   Neutrophils Relative % 88 (*) 43 - 77 %   Neutro Abs 7.4  1.7 - 7.7 K/uL   Lymphocytes Relative 6 (*) 12 - 46 %   Lymphs Abs 0.5 (*) 0.7 - 4.0 K/uL   Monocytes Relative 6  3 - 12 %   Monocytes Absolute 0.5  0.1 - 1.0 K/uL   Eosinophils Relative 0  0 - 5 %   Eosinophils Absolute 0.0  0.0 - 0.7 K/uL   Basophils Relative 0  0 - 1 %   Basophils Absolute 0.0  0.0 - 0.1 K/uL  PRO B NATRIURETIC PEPTIDE      Result Value Range   Pro B Natriuretic peptide (BNP) 3614.0 (*) 0 - 125 pg/mL  TROPONIN I      Result Value Range   Troponin I <0.30  <0.30 ng/mL  PROTIME-INR      Result Value Range   Prothrombin Time 12.7  11.6 - 15.2 seconds   INR 0.97  0.00 - 1.49  CK      Result Value Range   Total CK 5699 (*) 7 - 177 U/L    Imaging Review Dg Chest 1 View  01/25/2014   CLINICAL DATA:  Status post fall  EXAM: CHEST - 1 VIEW  COMPARISON:  PA and lateral chest x-ray at November 12, 2013.  FINDINGS: The cardiopericardial silhouette remains mildly enlarged. Since the previous study there has been clearing of the pleural effusion on the left. A large partially intrathoracic stomach is present in the midline and to the right. There is no pneumothorax nor significant pleural effusion today. There is no alveolar pneumonia. The observed portions of the bony thorax appear normal.  IMPRESSION: There has been interval clearing of the left pleural effusion. There is no evidence of pneumonia nor CHF. A large partially intrathoracic stomach is again demonstrated.   Electronically Signed   By: David  Martinique   On: 01/25/2014 20:23   Dg Hip Bilateral  W/pelvis  01/25/2014   CLINICAL DATA:  Unwitnessed fall  EXAM: BILATERAL HIP WITH PELVIS - 4+ VIEW  COMPARISON:  Lumbar spine series of November 26, 2013.  FINDINGS: The bony pelvis appears adequately mineralized. There is no evidence of an acute fracture. AP and lateral views of the left hip reveal the joint space to be preserved. There is no acute fracture or dislocation. The overlying soft tissues appear normal.  IMPRESSION: There is no evidence of a left hip fracture or fracture of the bony pelvis.   Electronically Signed   By: David  [place]   On: 01/25/2014 20:24   Ct Head Wo Contrast  01/25/2014   CLINICAL DATA:  Found unresponsive on the floor earlier today, current history of dementia.  EXAM: CT HEAD WITHOUT CONTRAST  TECHNIQUE: Contiguous axial images were obtained from the  base of the skull through the vertex without intravenous contrast.  COMPARISON:  10/22/2013.  FINDINGS: Ventricular system normal in size and appearance for age. Stable mild cortical atrophy. No mass lesion. No midline shift. No acute hemorrhage or hematoma. No extra-axial fluid collections. No evidence of acute infarction. No significant interval change.  No focal osseous abnormality involving the skull. Small air-fluid levels in the maxillary sinuses and, to a lesser degree, the right sphenoid sinus. Bilateral mastoid air cells and middle ear cavities well aerated. Marked bony nasal septal deviation to the right.  IMPRESSION: 1. No acute intracranial abnormality. 2. Stable mild cortical atrophy. 3. Mild acute bilateral maxillary and right sphenoid sinus disease.   Electronically Signed   By: Evangeline Dakin M.D.   On: 01/25/2014 20:34    EKG Interpretation   None      Date: 01/25/2014  Rate: 98  Rhythm: normal sinus rhythm  QRS Axis: normal  Intervals: normal  ST/T Wave abnormalities: nonspecific ST/T changes  Conduction Disutrbances:right bundle branch block  Narrative Interpretation:   Old EKG Reviewed: unchanged QT  is prolonged. In addition no sniffing changes in EKG compared 10/23/2013.   MDM   1. Fall   2. Dementia   3. Renal failure   4. Sinusitis    Patient with fall unknown time could have been as early as yesterday. Patient showing evidence of renal insufficiency he may be having some rhabdomyolysis urinalysis still pending. No evidence of injury from the fall head CT is negative chest x-ray without evidence of pneumonia CHF or pneumothorax. X-rays of both hips without any evidence of any bony injuries to the pelvis or the hips. Patient will require admission and hydration improvement of the renal function. We'll discuss with hospitalist for admission.  In addition no evidence of acute cardiac event. EKG does have a prolonged QT does show an incomplete right bundle branch block no significant changes compared to EKG from 10/23/2013. Troponin was negative. BP is elevated as stated no evidence CHF. Head CT without any evidence of intercranial injury.      Mervin Kung, MD 01/25/14 [id number]  Mervin Kung, MD 01/26/14 [phone]

## 2014-01-25 NOTE — ED Notes (Signed)
Voided 500cc dark urine using bedside commode.

## 2014-01-25 NOTE — H&P (Addendum)
[facility] History and Physical  Rachel Dodson UVO:[id number] DOB: 08-Sep-1946 DOA: 01/25/2014   PCP: Glo Herring., MD  Specialists: She is followed by Pulmonology in [facility]  Chief Complaint: Fall  HPI: Rachel Dodson is a 68 y.o. female with a past medical history of hypertension, depression, anxiety, a recent left-sided empyema, status post VATS procedure in November, right lung nodule. She subsequently went to skilled nursing facility for rehabilitation and spent 3 weeks. She came back home just before Christmas but does live alone. Patient was found to be lying on the bathroom floor today by her cousins. She was last seen normal yesterday. But over the last few weeks patient has been feeling weak. It appears that the patient also has history of dementia and is unable to provide much history. She is very confused. She tells me that she's been having nausea, vomiting, and diarrhea. But she also states that those episodes happened a few days ago. And then she said she had some earlier today. So history is not entirely reliable. History was obtained from the emergency department notes and some information also provided by her cousins. No fever. No chills. She has had poor oral intake in the last many days. She has been taking her medications on regular basis. She does have a history of sleep apnea, but doesn't use a CPAP machine. She cannot tell me  If she passed out or not. She does have a headache, but denies any other pain at this time. So history is very limited.  Home Medications: Prior to Admission medications   Medication Sig Start Date End Date Taking? Authorizing Provider  ALPRAZolam Duanne Moron) 0.5 MG tablet Take 1 tablet (0.5 mg total) by mouth at bedtime as needed for sleep. 11/02/13   Tiffany L Reed, DO  clonazePAM (KLONOPIN) 0.5 MG tablet Take 1 tablet (0.5 mg total) by mouth 2 (two) times daily. 11/02/13   Tiffany L Reed, DO  DULoxetine (CYMBALTA) 60 MG capsule Take 60 mg  by mouth daily. 09/29/13   Historical Provider, MD  furosemide (LASIX) 40 MG tablet Take 40 mg by mouth daily.  10/30/13   Grace Bushy Minor, NP  losartan (COZAAR) 50 MG tablet Take 50 mg by mouth daily. 09/29/13   Historical Provider, MD  ondansetron (ZOFRAN) 4 MG tablet Take 1 tablet (4 mg total) by mouth every 8 (eight) hours as needed for nausea. 10/22/13   Donne Hazel, MD  oxyCODONE (ROXICODONE) 15 MG immediate release tablet Take 1 tablet (15 mg total) by mouth 3 (three) times daily. 11/02/13   Tiffany L Reed, DO  oxyCODONE-acetaminophen (PERCOCET/ROXICET) 5-325 MG per tablet Take two tablets by mouth every 6 hours as needed for pain 11/26/13   Gayland Curry, DO    Allergies:  Allergies  Allergen Reactions  . Latex     Past Medical History: Past Medical History  Diagnosis Date  . Hypertension   . Depression   . Anxiety     Past Surgical History  Procedure Laterality Date  . Abdominal hysterectomy    . Video bronchoscopy N/A 10/23/2013    Procedure: VIDEO BRONCHOSCOPY;  Surgeon: Grace Isaac, MD;  Location: [place];  Service: Thoracic;  Laterality: N/A;  . Video assisted thoracoscopy Left 10/23/2013    Procedure: VIDEO ASSISTED THORACOSCOPY;  Surgeon: Grace Isaac, MD;  Location: [place];  Service: Thoracic;  Laterality: Left;    Social History: She lives alone. She has family members, who check on her on a  daily basis. She quit smoking in November. No alcohol use. No illicit drug use. Usually independent with daily activities.  Family History: There is history of stroke in the family  Review of Systems - unable to obtain due to her altered mental status, and possible history of dementia  Physical Examination  Filed Vitals:   01/25/14 1911  BP: 138/71  Pulse: 100  Temp: 96.6 F (35.9 C)  TempSrc: Rectal  Resp: 16  SpO2: 97%    General appearance: alert, cooperative, appears stated age, distracted and no distress Head: Normocephalic, without obvious  abnormality, atraumatic Eyes: conjunctivae/corneas clear. PERRL, EOM's intact. Throat: Dry mm Neck: no adenopathy, no carotid bruit, no JVD, supple, symmetrical, trachea midline and thyroid not enlarged, symmetric, no tenderness/mass/nodules Back: symmetric, no curvature. ROM normal. No CVA tenderness. Resp: clear to auscultation bilaterally Cardio: S1-S2 is normal. Regular. Systolic murmur appreciated over the precordium 4/6. no S3, S4. No rubs, or bruit. No pedal edema. GI: soft, non-tender; bowel sounds normal; no masses,  no organomegaly Extremities: extremities normal, atraumatic, no cyanosis or edema Pulses: 2+ and symmetric Skin: Skin color, texture, turgor normal. No rashes or lesions Lymph nodes: Cervical, supraclavicular, and axillary nodes normal. Neurologic: She is alert. She was oriented to place, year. Confused about month, and didn't know the date. Couldn't tell me the president's name. She could identify her cousins. No focal neurological deficits appreciated . Otherwise. She had good motor strength bilateral upper and lower extremities. Cranial nerves were intact 2-12.  Laboratory Data: Results for orders placed during the hospital encounter of 01/25/14 (from the past 48 hour(s))  GLUCOSE, CAPILLARY     Status: Abnormal   Collection Time    01/25/14  7:25 PM      Result Value Range   Glucose-Capillary 152 (*) 70 - 99 mg/dL  COMPREHENSIVE METABOLIC PANEL     Status: Abnormal   Collection Time    01/25/14  7:35 PM      Result Value Range   Sodium 131 (*) 137 - 147 mEq/L   Potassium 4.4  3.7 - 5.3 mEq/L   Chloride 89 (*) 96 - 112 mEq/L   CO2 20  19 - 32 mEq/L   Glucose, Bld 172 (*) 70 - 99 mg/dL   BUN 66 (*) 6 - 23 mg/dL   Creatinine, Ser 6.01 (*) 0.50 - 1.10 mg/dL   Calcium 9.7  8.4 - 09.3 mg/dL   Total Protein 8.2  6.0 - 8.3 g/dL   Albumin 4.3  3.5 - 5.2 g/dL   AST 235 (*) 0 - 37 U/L   ALT 32  0 - 35 U/L   Alkaline Phosphatase 100  39 - 117 U/L   Total Bilirubin  0.3  0.3 - 1.2 mg/dL   GFR calc non Af Amer 8 (*) >90 mL/min   GFR calc Af Amer 10 (*) >90 mL/min   Comment: (NOTE)     The eGFR has been calculated using the CKD EPI equation.     This calculation has not been validated in all clinical situations.     eGFR's persistently <90 mL/min signify possible Chronic Kidney     Disease.  LIPASE, BLOOD     Status: None   Collection Time    01/25/14  7:35 PM      Result Value Range   Lipase 21  11 - 59 U/L  CBC WITH DIFFERENTIAL     Status: Abnormal   Collection Time    01/25/14  7:35  PM      Result Value Range   WBC 8.4  4.0 - 10.5 K/uL   RBC 4.70  3.87 - 5.11 MIL/uL   Hemoglobin 14.8  12.0 - 15.0 g/dL   HCT 41.1  36.0 - 46.0 %   MCV 87.4  78.0 - 100.0 fL   MCH 31.5  26.0 - 34.0 pg   MCHC 36.0  30.0 - 36.0 g/dL   RDW 12.3  11.5 - 15.5 %   Platelets 199  150 - 400 K/uL   Neutrophils Relative % 88 (*) 43 - 77 %   Neutro Abs 7.4  1.7 - 7.7 K/uL   Lymphocytes Relative 6 (*) 12 - 46 %   Lymphs Abs 0.5 (*) 0.7 - 4.0 K/uL   Monocytes Relative 6  3 - 12 %   Monocytes Absolute 0.5  0.1 - 1.0 K/uL   Eosinophils Relative 0  0 - 5 %   Eosinophils Absolute 0.0  0.0 - 0.7 K/uL   Basophils Relative 0  0 - 1 %   Basophils Absolute 0.0  0.0 - 0.1 K/uL  PRO B NATRIURETIC PEPTIDE     Status: Abnormal   Collection Time    01/25/14  7:35 PM      Result Value Range   Pro B Natriuretic peptide (BNP) 3614.0 (*) 0 - 125 pg/mL  TROPONIN I     Status: None   Collection Time    01/25/14  7:35 PM      Result Value Range   Troponin I <0.30  <0.30 ng/mL   Comment:            Due to the release kinetics of cTnI,     a negative result within the first hours     of the onset of symptoms does not rule out     myocardial infarction with certainty.     If myocardial infarction is still suspected,     repeat the test at appropriate intervals.  PROTIME-INR     Status: None   Collection Time    01/25/14  7:35 PM      Result Value Range   Prothrombin Time 12.7   11.6 - 15.2 seconds   INR 0.97  0.00 - 1.49  CK     Status: Abnormal   Collection Time    01/25/14  7:35 PM      Result Value Range   Total CK 5699 (*) 7 - 177 U/L  LACTIC ACID, PLASMA     Status: Abnormal   Collection Time    01/25/14  8:11 PM      Result Value Range   Lactic Acid, Venous 2.5 (*) 0.5 - 2.2 mmol/L    Radiology Reports: Dg Chest 1 View  01/25/2014   CLINICAL DATA:  Status post fall  EXAM: CHEST - 1 VIEW  COMPARISON:  PA and lateral chest x-ray at November 12, 2013.  FINDINGS: The cardiopericardial silhouette remains mildly enlarged. Since the previous study there has been clearing of the pleural effusion on the left. A large partially intrathoracic stomach is present in the midline and to the right. There is no pneumothorax nor significant pleural effusion today. There is no alveolar pneumonia. The observed portions of the bony thorax appear normal.  IMPRESSION: There has been interval clearing of the left pleural effusion. There is no evidence of pneumonia nor CHF. A large partially intrathoracic stomach is again demonstrated.   Electronically Signed   By: Shanon Brow  [place]   On: 01/25/2014 20:23   Dg Hip Bilateral W/pelvis  01/25/2014   CLINICAL DATA:  Unwitnessed fall  EXAM: BILATERAL HIP WITH PELVIS - 4+ VIEW  COMPARISON:  Lumbar spine series of November 26, 2013.  FINDINGS: The bony pelvis appears adequately mineralized. There is no evidence of an acute fracture. AP and lateral views of the left hip reveal the joint space to be preserved. There is no acute fracture or dislocation. The overlying soft tissues appear normal.  IMPRESSION: There is no evidence of a left hip fracture or fracture of the bony pelvis.   Electronically Signed   By: David  Martin[place]   On: 01/25/2014 20:24   Ct Head Wo Contrast  01/25/2014   CLINICAL DATA:  Found unresponsive on the floor earlier today, current history of dementia.  EXAM: CT HEAD WITHOUT CONTRAST  TECHNIQUE: Contiguous axial images were obtained  from the base of the skull through the vertex without intravenous contrast.  COMPARISON:  10/22/2013.  FINDINGS: Ventricular system normal in size and appearance for age. Stable mild cortical atrophy. No mass lesion. No midline shift. No acute hemorrhage or hematoma. No extra-axial fluid collections. No evidence of acute infarction. No significant interval change.  No focal osseous abnormality involving the skull. Small air-fluid levels in the maxillary sinuses and, to a lesser degree, the right sphenoid sinus. Bilateral mastoid air cells and middle ear cavities well aerated. Marked bony nasal septal deviation to the right.  IMPRESSION: 1. No acute intracranial abnormality. 2. Stable mild cortical atrophy. 3. Mild acute bilateral maxillary and right sphenoid sinus disease.   Electronically Signed   By: Evangeline Dakin M.D.   On: 01/25/2014 20:34    Electrocardiogram: Sinus rhythm at 98 beats per minute. Right bundle branch block. Normal axis. Nonspecific T. inversions noted. Similar to previous EKGs.  Problem List  Principal Problem:   ARF (acute renal failure) Active Problems:   Rhabdomyolysis   Dehydration   Fall   Dementia   Assessment: This is a 68 year old Caucasian female, who presents after having had a fall at home. Her total CK is elevated suggesting rhabdomyolysis. She does have acute renal failure, most likely due to a combination of Rhabdomyolysis and hypovolemia/dehydration.  Plan: #1 acute renal failure: Urine output will be monitored closely. Foley catheter may have to be placed if the patient is not able to urinate on her own. UA will be obtained. She'll be given IV fluids. If renal function does not improve ultrasound will have to be obtained. Hold nephrotoxic agents, including her diuretics and ARB.  #2 rhabdomyolysis: Most likely secondary to fall. Check total CK levels on a daily basis. Hydrate her.  #3 fall: It is unclear if she had a syncopal episode or not. We will  monitor her on telemetry. CT head and hip X-rays did not show any trauma. No other obvious bony injuries are noted on examination. We will get an echocardiogram due to her murmur and possibility of syncope.  #4 hyperglycemia: She is not a diabetic. This is not a fasting level. Repeat glucose level in the morning.  #5 mildly elevated lactic acid level: Most likely due to hypovolemia. No evidence for sepsis. UA is pending.  Elevated BNP is noted, however, clinically, she is dehydrated. No echocardiogram reports are available in the computer. We will order an echocardiogram.  DVT Prophylaxis: Heparin Code Status: Full code Family Communication: Discussed with the patient and her cousins  Disposition Plan: Admit to telemetry   Further management  decisions will depend on results of further testing and patient's response to treatment.  [facility]  [facility] Pager [phone]  If 7PM-7AM, please contact night-coverage [email or web address] Password [facility]  01/25/2014, 9:34 PM

## 2014-01-25 NOTE — ED Notes (Signed)
Hx of dementia, lives alone. Last time family spoke with her was yesterday. Found by family this evening on bathroom floor. Unknown time of fall.

## 2014-01-26 DIAGNOSIS — I059 Rheumatic mitral valve disease, unspecified: Secondary | ICD-10-CM

## 2014-01-26 DIAGNOSIS — F0390 Unspecified dementia without behavioral disturbance: Secondary | ICD-10-CM

## 2014-01-26 DIAGNOSIS — E860 Dehydration: Secondary | ICD-10-CM

## 2014-01-26 LAB — CBC
HCT: 37.5 % (ref 36.0–46.0)
Hemoglobin: 13.2 g/dL (ref 12.0–15.0)
MCH: 31.4 pg (ref 26.0–34.0)
MCHC: 35.2 g/dL (ref 30.0–36.0)
MCV: 89.1 fL (ref 78.0–100.0)
Platelets: 181 10*3/uL (ref 150–400)
RBC: 4.21 MIL/uL (ref 3.87–5.11)
RDW: 12.5 % (ref 11.5–15.5)
WBC: 7.9 10*3/uL (ref 4.0–10.5)

## 2014-01-26 LAB — COMPREHENSIVE METABOLIC PANEL
ALBUMIN: 3.7 g/dL (ref 3.5–5.2)
ALT: 31 U/L (ref 0–35)
AST: 108 U/L — AB (ref 0–37)
Alkaline Phosphatase: 83 U/L (ref 39–117)
BUN: 62 mg/dL — ABNORMAL HIGH (ref 6–23)
CALCIUM: 8.9 mg/dL (ref 8.4–10.5)
CHLORIDE: 95 meq/L — AB (ref 96–112)
CO2: 20 meq/L (ref 19–32)
CREATININE: 4.47 mg/dL — AB (ref 0.50–1.10)
GFR calc Af Amer: 11 mL/min — ABNORMAL LOW (ref >90)
GFR, EST NON AFRICAN AMERICAN: 9 mL/min — AB (ref >90)
Glucose, Bld: 108 mg/dL — ABNORMAL HIGH (ref 70–99)
Potassium: 4.6 mEq/L (ref 3.7–5.3)
SODIUM: 134 meq/L — AB (ref 137–147)
Total Bilirubin: 0.3 mg/dL (ref 0.3–1.2)
Total Protein: 7.1 g/dL (ref 6.0–8.3)

## 2014-01-26 LAB — TROPONIN I

## 2014-01-26 LAB — CK TOTAL AND CKMB (NOT AT ARMC)
CK, MB: 59.2 ng/mL — AB (ref 0.3–4.0)
RELATIVE INDEX: 1.3 (ref 0.0–2.5)
Total CK: 4515 U/L — ABNORMAL HIGH (ref 7–177)

## 2014-01-26 NOTE — Evaluation (Signed)
Physical Therapy Evaluation Patient Details Name: Rachel Dodson MRN: [id number] DOB: 12-Nov-1946 Today's Date: 01/26/2014 Time: 0258-5277 PT Time Calculation (min): 43 min  PT Assessment / Plan / Recommendation History of Present Illness  Pt is admitted with acute renal failure.  She had a fall at home and now feels generally weak.  Pt had a stay at SNF up until Christmas last year.  She has a dx of dementia, lives alone.  Clinical Impression  Pt was c/o HA and general malaise and not particularly interested in activity today.  She was recently medicated by RN for HA without result.  Pt has decreased standing balance and should be using a walker for all gait, in my opinion.  She seems to have minimal support at home and would be well suited to transition over to ACLF but she is not interested in this.  I would recommend HHPT for improved balance and gait stability at home.    PT Assessment  Patient needs continued PT services    Follow Up Recommendations  Home health PT    Does the patient have the potential to tolerate intense rehabilitation      Barriers to Discharge Decreased caregiver support      Equipment Recommendations  None recommended by PT    Recommendations for Other Services     Frequency Min 3X/week    Precautions / Restrictions Precautions Precautions: Fall Restrictions Weight Bearing Restrictions: No   Pertinent Vitals/Pain       Mobility  Bed Mobility Overal bed mobility: Independent Transfers Overall transfer level: Modified independent Equipment used: None Ambulation/Gait Ambulation/Gait assistance: Min guard Ambulation Distance (Feet): 125 Feet Assistive device: None;Rolling walker (2 wheeled) Gait Pattern/deviations: Staggering right;Staggering left;Narrow base of support Gait velocity interpretation: at or above normal speed for age/gender General Gait Details: Pt actuallly "falls forward" as she walks and is at high risk of fall especially with  a narrow base of support.  A walker improves her gait stability significantly    Exercises     PT Diagnosis: Abnormality of gait;Generalized weakness  PT Problem List: Decreased activity tolerance;Decreased mobility;Decreased knowledge of use of DME PT Treatment Interventions: Gait training;Stair training;Balance training     PT Goals(Current goals can be found in the care plan section) Acute Rehab PT Goals Patient Stated Goal: none stated PT Goal Formulation: With patient Time For Goal Achievement: 02/09/14 Potential to Achieve Goals: Good  Visit Information  Last PT Received On: 01/26/14 History of Present Illness: Pt is admitted with acute renal failure.  She had a fall at home and now feels generally weak.  Pt had a stay at SNF up until Christmas last year.  She has a dx of dementia, lives alone.       Prior [place] expects to be discharged to:: Private residence Living Arrangements: Alone Available Help at Discharge: Family;Available PRN/intermittently Type of Home: House Home Access: Stairs to enter [employer] of Steps: 2 Entrance Stairs-Rails: None Home Layout: One level Home Equipment: Walker - 2 wheels;Bedside commode;Shower seat Prior Function Level of Independence: Independent Communication Communication: No difficulties    Cognition  Cognition Arousal/Alertness: Awake/alert Behavior During Therapy: WFL for tasks assessed/performed Overall Cognitive Status: History of cognitive impairments - at baseline    Extremity/Trunk Assessment Lower Extremity Assessment Lower Extremity Assessment: Overall WFL for tasks assessed   Balance Balance Overall balance assessment: History of Falls;Needs assistance Sitting-balance support: No upper extremity supported;Feet supported Sitting balance-Leahy Scale: Good Standing balance support:  No upper extremity supported Standing balance-Leahy Scale: Fair  End of Session PT - End  of Session Equipment Utilized During Treatment: Gait belt Activity Tolerance: Patient tolerated treatment well Patient left: in bed;with call bell/phone within reach;with bed alarm set  GP     Demetrios Isaacs L 01/26/2014, 4:16 PM

## 2014-01-26 NOTE — Care Management Note (Addendum)
    Page 1 of 2   01/29/2014     11:21:46 AM   CARE MANAGEMENT NOTE 01/29/2014  Patient:  Rachel Dodson, Rachel Dodson   Account Number:  [id number]  Date Initiated:  01/26/2014  Documentation initiated by:  Theophilus Kinds  Subjective/Objective Assessment:   Pt admitted from home with renal failure and rhabdomylosis. Pt lives alone and has very few friends that are able to help pt out (MD appts, household duties, etc). Pt has a shower chair and walker that was borrowed from friends.  Pt stated     Action/Plan:   she has been doing ADL's and falling frequently. PT to eval pt about possible need for placement. Pt has used [facility] in the past. Discussed [facility] with pt as well. Will alert CSW about possible SNF need.   Anticipated DC Date:  01/29/2014   Anticipated DC Plan:  SKILLED NURSING FACILITY  In-house referral  Clinical Social Worker      DC Forensic scientist  CM consult      [facility] Choice  HOME HEALTH   Choice offered to / List presented to:  C-1 Patient        [place] arranged  HH-1 RN  [place].   Status of service:  Completed, signed off Medicare Important Message given?  YES (If response is "NO", the following Medicare IM given date fields will be blank) Date Medicare IM given:  01/29/2014 Date Additional Medicare IM given:    Discharge Disposition:  [place]  Per UR Regulation:    If discussed at Long Length of Stay Meetings, dates discussed:    Comments:  01/29/14 [place], RN BSN CM Pt potential discharge 2/6 or 01/30/14 with [facility] RN, PT and OT. Schuyler Amor of [facility] is aware and will collect pts information from the chart. Pt given list of [place] options and pt chose [facility] since has used in the recent past. [place] services to start within 48 hours of discharge. Pt and pts nurse aware of discharge arrangements.  01/26/14 [place], RN BSN CM

## 2014-01-26 NOTE — Progress Notes (Signed)
[facility] PROGRESS NOTE  Rachel Dodson GLO:[id number] DOB: [birth year] DOA: 01/25/2014 PCP: Glo Herring., MD  Assessment/Plan: 1. Acute renal failure. Felt to be related to dehydration and rhabdomyolysis. The patient was started on IV fluids with mild improvement in her renal function. Improvement has been modest and therefore we will obtain renal ultrasound. Hold nephrotoxic agents. Monitor strict intake and output. If renal function fails to improve further, she may need a nephrology consultation tomorrow. 2. Rhabdomyolysis. Improved with IV fluids, continue to follow CK levels. 3. Fall. Unclear it is a syncopal event. CT head was unremarkable. Echocardiogram did not show any significant findings. Ejection fraction was intact. She is monitor on telemetry. In physical therapy since the patient has recommended home health physical therapy. 4. Elevated BNP. Likely related to renal failure. She does not appear to be volume overloaded. Ejection fraction is intact 5. Dehydration. Improvng with IV fluids.  Code Status: full code Family Communication: discussed with patient, no family present Disposition Plan: discharge home once improved   Consultants:  none  Procedures: Echo: - Left ventricle: The cavity size was normal. Wall thickness was increased in a pattern of moderate LVH. Systolic function was vigorous. The estimated ejection fraction was in the range of 65% to 70%. Wall motion was normal; there were no regional wall motion abnormalities. There was an increased relative contribution of atrial contraction to ventricular filling. Doppler parameters are consistent with abnormal left ventricular relaxation (grade 1 diastolic dysfunction). Doppler parameters are consistent with high ventricular filling pressure. - Aortic valve: Mildly increased velocity across the left ventricular outflow tract. Trileaflet; mildly thickened, mildly calcified leaflets. - Mitral valve:  Calcified annulus. Mildly thickened leaflets . Mild regurgitation. - Left atrium: The atrium was moderately dilated. - Tricuspid valve: Mild regurgitation. - Pulmonary arteries: PA peak pressure: 59mm Hg (S). Moderately elevated pulmonary pressures    Antibiotics:  none  HPI/Subjective: Still feels weak, but overall feeling slightly better. No vomiting or diarrhea.  Objective: Filed Vitals:   01/26/14 1441  BP: 114/67  Pulse: 75  Temp: 98 F (36.7 C)  Resp: 18    Intake/Output Summary (Last 24 hours) at 01/26/14 1843 Last data filed at 01/26/14 1700  Gross per 24 hour  Intake 1926.67 ml  Output    350 ml  Net 1576.67 ml   Filed Weights   01/25/14 2228  Weight: 60.419 kg (133 lb 3.2 oz)    Exam:   General:  No acute distress  Cardiovascular: S1, S2, regular rate and rhythm  Respiratory: Clear to auscultation bilaterally  Abdomen: Soft, nontender, positive bowel sounds  Musculoskeletal: No pedal edema bilaterally   Data Reviewed: Basic Metabolic Panel:  Recent Labs Lab 01/25/14 1935 01/26/14 0450  NA 131* 134*  K 4.4 4.6  CL 89* 95*  CO2 20 20  GLUCOSE 172* 108*  BUN 66* 62*  CREATININE 4.84* 4.47*  CALCIUM 9.7 8.9   Liver Function Tests:  Recent Labs Lab 01/25/14 1935 01/26/14 0450  AST 113* 108*  ALT 32 31  ALKPHOS 100 83  BILITOT 0.3 0.3  PROT 8.2 7.1  ALBUMIN 4.3 3.7    Recent Labs Lab 01/25/14 1935  LIPASE 21   No results found for this basename: AMMONIA,  in the last 168 hours CBC:  Recent Labs Lab 01/25/14 1935 01/26/14 0450  WBC 8.4 7.9  NEUTROABS 7.4  --   HGB 14.8 13.2  HCT 41.1 37.5  MCV 87.4 89.1  PLT 199 181   Cardiac Enzymes:  Recent Labs Lab 01/25/14 1935 01/26/14 0450  CKTOTAL 5699* 4515*  CKMB  --  59.2*  TROPONINI <0.30 <0.30   BNP (last 3 results)  Recent Labs  10/21/13 1123 01/25/14 1935  PROBNP 642.7* 3614.0*   CBG:  Recent Labs Lab 01/25/14 1925  GLUCAP 152*    No results  found for this or any previous visit (from the past 240 hour(s)).   Studies: Dg Chest 1 View  01/25/2014   CLINICAL DATA:  Status post fall  EXAM: CHEST - 1 VIEW  COMPARISON:  PA and lateral chest x-ray at November 12, 2013.  FINDINGS: The cardiopericardial silhouette remains mildly enlarged. Since the previous study there has been clearing of the pleural effusion on the left. A large partially intrathoracic stomach is present in the midline and to the right. There is no pneumothorax nor significant pleural effusion today. There is no alveolar pneumonia. The observed portions of the bony thorax appear normal.  IMPRESSION: There has been interval clearing of the left pleural effusion. There is no evidence of pneumonia nor CHF. A large partially intrathoracic stomach is again demonstrated.   Electronically Signed   By: David  [place]   On: 01/25/2014 20:23   Dg Hip Bilateral W/pelvis  01/25/2014   CLINICAL DATA:  Unwitnessed fall  EXAM: BILATERAL HIP WITH PELVIS - 4+ VIEW  COMPARISON:  Lumbar spine series of November 26, 2013.  FINDINGS: The bony pelvis appears adequately mineralized. There is no evidence of an acute fracture. AP and lateral views of the left hip reveal the joint space to be preserved. There is no acute fracture or dislocation. The overlying soft tissues appear normal.  IMPRESSION: There is no evidence of a left hip fracture or fracture of the bony pelvis.   Electronically Signed   By: David  [place]   On: 01/25/2014 20:24   Ct Head Wo Contrast  01/25/2014   CLINICAL DATA:  Found unresponsive on the floor earlier today, current history of dementia.  EXAM: CT HEAD WITHOUT CONTRAST  TECHNIQUE: Contiguous axial images were obtained from the base of the skull through the vertex without intravenous contrast.  COMPARISON:  10/22/2013.  FINDINGS: Ventricular system normal in size and appearance for age. Stable mild cortical atrophy. No mass lesion. No midline shift. No acute hemorrhage or hematoma. No  extra-axial fluid collections. No evidence of acute infarction. No significant interval change.  No focal osseous abnormality involving the skull. Small air-fluid levels in the maxillary sinuses and, to a lesser degree, the right sphenoid sinus. Bilateral mastoid air cells and middle ear cavities well aerated. Marked bony nasal septal deviation to the right.  IMPRESSION: 1. No acute intracranial abnormality. 2. Stable mild cortical atrophy. 3. Mild acute bilateral maxillary and right sphenoid sinus disease.   Electronically Signed   By: Evangeline Dakin M.D.   On: 01/25/2014 20:34    Scheduled Meds: . clonazePAM  0.5 mg Oral BID  . DULoxetine  60 mg Oral Daily  . heparin  5,000 Units Subcutaneous Q8H  . sodium chloride  3 mL Intravenous Q12H   Continuous Infusions: . sodium chloride 100 mL/hr at 01/26/14 1700    Principal Problem:   ARF (acute renal failure) Active Problems:   Rhabdomyolysis   Dehydration   Fall   Dementia    Time spent: 13mins    MEMON,JEHANZEB  [facility] Pager [phone]. If 7PM-7AM, please contact night-coverage at [email or web address], password [facility] 01/26/2014, 6:43 PM  LOS: 1 day            \

## 2014-01-26 NOTE — Progress Notes (Signed)
CRITICAL VALUE ALERT  Critical value received: CKMB 59.2  Date of notification: 01/26/2014  Time of notification:  6599  Critical value read back: yes  Nurse who received alert:  Morene Antu, LPN  MD notified (1st page):  Memon  Time of first page: 1453  MD notified (2nd page): Memon  Time of second page: 1503  Responding MD:  Roderic Palau   Time MD responded:  [phone]

## 2014-01-26 NOTE — Progress Notes (Signed)
UR chart review completed.  

## 2014-01-26 NOTE — Progress Notes (Signed)
*  PRELIMINARY RESULTS* Echocardiogram 2D Echocardiogram has been performed.  [place], [place] 01/26/2014, 9:55 AM

## 2014-01-27 ENCOUNTER — Inpatient Hospital Stay (HOSPITAL_COMMUNITY): Payer: Medicare Other

## 2014-01-27 LAB — URINE CULTURE: Colony Count: 30000

## 2014-01-27 LAB — CK: Total CK: 2349 U/L — ABNORMAL HIGH (ref 7–177)

## 2014-01-27 LAB — BASIC METABOLIC PANEL
BUN: 53 mg/dL — ABNORMAL HIGH (ref 6–23)
CALCIUM: 8.1 mg/dL — AB (ref 8.4–10.5)
CHLORIDE: 103 meq/L (ref 96–112)
CO2: 19 mEq/L (ref 19–32)
CREATININE: 3.42 mg/dL — AB (ref 0.50–1.10)
GFR calc Af Amer: 15 mL/min — ABNORMAL LOW (ref >90)
GFR calc non Af Amer: 13 mL/min — ABNORMAL LOW (ref >90)
GLUCOSE: 98 mg/dL (ref 70–99)
Potassium: 3.7 mEq/L (ref 3.7–5.3)
Sodium: 136 mEq/L — ABNORMAL LOW (ref 137–147)

## 2014-01-27 NOTE — Progress Notes (Signed)
[facility] PROGRESS NOTE  BEUNA BOLDING FTD:[id number] DOB: [birth year] DOA: 01/25/2014 PCP: Glo Herring., MD  Summary: 68 year old woman lives alone presented with history of fall for unknown duration. Admitted for acute renal failure, rhabdomyolysis secondary to prolonged immobility.  Assessment/Plan: 1. Acute renal failure secondary to rhabdomyolysis complicated by diuretics and ARB. Slowly improving with IV fluids. Adequate urine output. Renal ultrasound unremarkable. 2. Rhabdomyolysis. Improving with IV fluids, causes above. 3. Fall home. Etiology unclear. Patient could not provide history. CT head and hip x-rays unremarkable. No evidence of bony injuries. 2-D echocardiogram was unremarkable. Xanax and oxycodone on hold. 4. Hypertension. Stable. 5. Dementia. Appears stable. 6. History of empyema status post VATS 10/2013   Continue IV fluids.  Check basic metabolic panel morning.  Likely home in next 48 hours.  Code Status: full code DVT prophylaxis: heparin Family Communication: none present Disposition Plan: home  Murray Hodgkins, MD  [facility]  Pager [phone] If 7PM-7AM, please contact night-coverage at [email or web address], password [facility] 01/27/2014, 4:08 PM  LOS: 2 days   Consultants:  PT: HHPT  OT: HHOT  Procedures:  2-D echocardiogram: Left ventricular ejection fraction 65-20%. Normal motion. Grade 1 diastolic dysfunction.  Antibiotics:    HPI/Subjective: Overall doing okay. Has a headache. No nausea or vomiting. She does not know why she passed out.  Objective: Filed Vitals:   01/26/14 1441 01/26/14 2236 01/27/14 0651 01/27/14 1440  BP: 114/67 137/74 115/72 144/67  Pulse: 75 68 81 77  Temp: 98 F (36.7 C) 97.8 F (36.6 C) 97.1 F (36.2 C) 98 F (36.7 C)  TempSrc: Oral Oral Oral Oral  Resp: 18 18 18 20   Height:      Weight:      SpO2: 92%  99% 100%    Intake/Output Summary (Last 24 hours) at 01/27/14 1608 Last data filed at  01/27/14 1300  Gross per 24 hour  Intake   2660 ml  Output    900 ml  Net   1760 ml     Filed Weights   01/25/14 2228  Weight: 60.419 kg (133 lb 3.2 oz)    Exam:   Afebrile, vital signs stable. No hypoxia.  General: Appears calm and comfortable. Speech fluent and clear.  Cardiovascular: Regular rate and rhythm. No murmur, rub or gallop. No lower extremity edema.  Respiratory: Clear to auscultation bilaterally. No wheezes, rales or rhonchi. Normal respiratory effort.  Abdomen soft  Musculoskeletal moves all extremities to command  Neurologic grossly normal  Data Reviewed:  Adequate urine output.  BUN, creatinine trending downwards, 53/3.14.  Total CK trending downwards, 2349  Urine culture unremarkable  CT head unremarkable.  No acute abnormalities on US renal  Scheduled Meds: . clonazePAM  0.5 mg Oral BID  . DULoxetine  60 mg Oral Daily  . heparin  5,000 Units Subcutaneous Q8H  . sodium chloride  3 mL Intravenous Q12H   Continuous Infusions: . sodium chloride 100 mL/hr at 01/27/14 0500    Principal Problem:   ARF (acute renal failure) Active Problems:   Rhabdomyolysis   Dehydration   Fall   Dementia   Time spent 20 minutes

## 2014-01-27 NOTE — Evaluation (Signed)
Occupational Therapy Evaluation Patient Details Name: Rachel Dodson MRN: [id number] DOB: 1946/09/19 Today's Date: 01/27/2014 Time: 9326-7124 OT Time Calculation (min): 19 min Eval 19'  OT Assessment / Plan / Recommendation History of present illness Rachel Dodson is a 68 y.o. female with a past medical history of hypertension, depression, anxiety, a recent left-sided empyema, status post VATS procedure in November, right lung nodule. She subsequently went to skilled nursing facility for rehabilitation and spent 3 weeks. She came back home just before Christmas but does live alone. Patient was found to be lying on the bathroom floor by her cousins. She was last seen normal day prior, but over the last few weeks patient has been feeling weak. It appears that the patient also has history of dementia.   Clinical Impression   Pt is 68 yo who presents to acute OT with decreased ability to complete all necessary ADL/IADL tasks during the day. She lives alone and was independent prior to admission, but has recently had increased difficulty safely completing ADLS - pt's current strategy is to "wait until I feel up to it." Recommend HHOT to increase pt's ability to increase activity tolerance and safely complete ADL tasks at home. Pt needs no further acute OT services at this time.    OT Assessment  All further OT needs can be met in the next venue of care    Follow Up Recommendations  Home health OT       Equipment Recommendations   (Defer to next venue)          Precautions / Restrictions Precautions Precautions: Fall       ADL  Eating/Feeding: Independent Where Assessed - Eating/Feeding: Bed level Grooming: Independent Lower Body Dressing: Modified independent ADL Comments: Pt was independent with ADLs prior to admission, but has recently had increased fatigue, limiting her ability to safely complete all ADL/IADL tasks. Pt recently had fainting episode while grocery shopping.    OT  Diagnosis: Generalized weakness  OT Problem List: Decreased activity tolerance;Decreased strength   OT Goals Acute Rehab OT Goals Patient Stated Goal: "To take control again." OT Goal Formulation: With patient Time For Goal Achievement: 02/10/14 Potential to Achieve Goals: Good  Visit Information  Last OT Received On: 01/27/14 History of Present Illness: Rachel Dodson is a 68 y.o. female with a past medical history of hypertension, depression, anxiety, a recent left-sided empyema, status post VATS procedure in November, right lung nodule. She subsequently went to skilled nursing facility for rehabilitation and spent 3 weeks. She came back home just before Christmas but does live alone. Patient was found to be lying on the bathroom floor by her cousins. She was last seen normal day prior, but over the last few weeks patient has been feeling weak. It appears that the patient also has history of dementia.       Prior [place] expects to be discharged to:: Private residence Living Arrangements: Alone Available Help at Discharge: Family;Available PRN/intermittently Type of Home: House Home Access: Stairs to enter [employer] of Steps: 2 Entrance Stairs-Rails: None Home Layout: One level Home Equipment: Walker - 2 wheels;Bedside commode;Shower seat Prior Function Level of Independence: Independent Communication Communication: No difficulties Dominant Hand: Right     Vision/Perception Vision - History Baseline Vision: Wears glasses only for reading   Cognition  Cognition Arousal/Alertness: Lethargic Behavior During Therapy: WFL for tasks assessed/performed Overall Cognitive Status: History of cognitive impairments - at baseline  Extremity/Trunk Assessment Upper Extremity Assessment Upper Extremity Assessment: Overall WFL for tasks assessed Lower Extremity Assessment Lower Extremity Assessment: Defer to PT evaluation               End of Session OT - End of Session Activity Tolerance: Patient tolerated treatment well;Patient limited by lethargy Patient left: in bed;with call bell/phone within reach       Bea Graff, [place], OTR/L [phone]  01/27/2014, 9:11 AM

## 2014-01-28 DIAGNOSIS — E876 Hypokalemia: Secondary | ICD-10-CM | POA: Diagnosis present

## 2014-01-28 LAB — BASIC METABOLIC PANEL
BUN: 42 mg/dL — ABNORMAL HIGH (ref 6–23)
CALCIUM: 7.9 mg/dL — AB (ref 8.4–10.5)
CO2: 18 mEq/L — ABNORMAL LOW (ref 19–32)
CREATININE: 2.76 mg/dL — AB (ref 0.50–1.10)
Chloride: 106 mEq/L (ref 96–112)
GFR calc Af Amer: 19 mL/min — ABNORMAL LOW (ref >90)
GFR, EST NON AFRICAN AMERICAN: 17 mL/min — AB (ref >90)
GLUCOSE: 90 mg/dL (ref 70–99)
Potassium: 3.4 mEq/L — ABNORMAL LOW (ref 3.7–5.3)
Sodium: 137 mEq/L (ref 137–147)

## 2014-01-28 MED ORDER — POTASSIUM CHLORIDE CRYS ER 20 MEQ PO TBCR
40.0000 meq | EXTENDED_RELEASE_TABLET | Freq: Once | ORAL | Status: AC
  Administered 2014-01-28: 40 meq via ORAL
  Filled 2014-01-28: qty 2

## 2014-01-28 MED ORDER — STERILE WATER FOR INJECTION IV SOLN
INTRAVENOUS | Status: DC
  Administered 2014-01-28 (×3): via INTRAVENOUS
  Filled 2014-01-28 (×14): qty 850

## 2014-01-28 NOTE — Progress Notes (Signed)
[facility] PROGRESS NOTE  OLUWASEUN BRUYERE QQI:[id number] DOB: 1946-08-28 DOA: 01/25/2014 PCP: Glo Herring., MD  Assessment/Plan: Acute renal failure secondary to rhabdomyolysis complicated by diuretics and ARB. Continues to improve with IV fluids. Adequate urine output. Renal ultrasound unremarkable.   Rhabdomyolysis. Continues to improve with IV fluids, causes above.   Fall home. Etiology unclear. Patient could not provide history. CT head and hip x-rays unremarkable. No evidence of bony injuries. 2-D echocardiogram was unremarkable. Xanax and oxycodone on hold.   Hypertension. Stable. SBP range 130-145.   Hypokalemia: mild. Related to #1. Will replete and recheck.    Dementia. Appears stable.  History of empyema status post VATS 10/2013  Code Status: full Family Communication: none present Disposition Plan: home hopefully tomorrow   Consultants:  none  Procedures: 2-D echocardiogram: Left ventricular ejection fraction 65-20%. Normal motion. Grade 1 diastolic dysfunction   Antibiotics:  none  HPI/Subjective: Lying in bed eyes closed. Arouses to verbal stimuli  Objective: Filed Vitals:   01/28/14 0500  BP: 145/67  Pulse: 79  Temp: 98.3 F (36.8 C)  Resp: 17    Intake/Output Summary (Last 24 hours) at 01/28/14 1148 Last data filed at 01/28/14 0500  Gross per 24 hour  Intake    480 ml  Output   1000 ml  Net   -520 ml   Filed Weights   01/25/14 2228  Weight: 60.419 kg (133 lb 3.2 oz)    Exam:   General:  Well nourished NAD  Cardiovascular: RRR No MGR no LE edema  Respiratory: normal effort slightly shallow BS clear bilaterally no wheeze  Abdomen: obese soft +BS non-tender to palpation  Musculoskeletal: no clubbing or cyanosis   Data Reviewed: Basic Metabolic Panel:  Recent Labs Lab 01/25/14 1935 01/26/14 0450 01/27/14 0528 01/28/14 0447  NA 131* 134* 136* 137  K 4.4 4.6 3.7 3.4*  CL 89* 95* 103 106  CO2 20 20 19  18*  GLUCOSE  172* 108* 98 90  BUN 66* 62* 53* 42*  CREATININE 4.84* 4.47* 3.42* 2.76*  CALCIUM 9.7 8.9 8.1* 7.9*   Liver Function Tests:  Recent Labs Lab 01/25/14 1935 01/26/14 0450  AST 113* 108*  ALT 32 31  ALKPHOS 100 83  BILITOT 0.3 0.3  PROT 8.2 7.1  ALBUMIN 4.3 3.7    Recent Labs Lab 01/25/14 1935  LIPASE 21   No results found for this basename: AMMONIA,  in the last 168 hours CBC:  Recent Labs Lab 01/25/14 1935 01/26/14 0450  WBC 8.4 7.9  NEUTROABS 7.4  --   HGB 14.8 13.2  HCT 41.1 37.5  MCV 87.4 89.1  PLT 199 181   Cardiac Enzymes:  Recent Labs Lab 01/25/14 1935 01/26/14 0450 01/27/14 0528  CKTOTAL 5699* 4515* 2349*  CKMB  --  59.2*  --   TROPONINI <0.30 <0.30  --    BNP (last 3 results)  Recent Labs  10/21/13 1123 01/25/14 1935  PROBNP 642.7* 3614.0*   CBG:  Recent Labs Lab 01/25/14 1925  GLUCAP 152*    Recent Results (from the past 240 hour(s))  URINE CULTURE     Status: None   Collection Time    01/25/14  9:48 PM      Result Value Range Status   Specimen Description URINE, CLEAN CATCH   Final   Special Requests NONE   Final   Culture  Setup Time     Final   Value: 01/26/2014 13:54     Performed at [facility]   Colony Count     Final   Value: 30,000 COLONIES/ML     Performed at [facility]   Culture     Final   Value: Multiple bacterial morphotypes present, none predominant. Suggest appropriate recollection if clinically indicated.     Performed at [facility]   Report Status 01/27/2014 FINAL   Final     Studies: US Renal  01/27/2014   CLINICAL DATA:  Acute renal failure .  EXAM: RENAL/URINARY TRACT ULTRASOUND COMPLETE  COMPARISON:  None.  FINDINGS: Right Kidney:  Length: 10.1 cm. 10 x 8 mm simple cyst medial portion right kidney. Stable from prior CT . Kidney is slightly echogenic, this suggests possibility of chronic medical renal disease. No hydronephrosis.  Left Kidney:  Length: 10.1 cm. Kidney is  slightly echogenic, suggesting possibility of chronic medical renal disease. No hydronephrosis.  Bladder:  Appears normal for degree of bladder distention.  IMPRESSION: Mild increased echogenicity of the kidneys cannot be excluded. Possibility of chronic medical renal disease should be considered. No acute abnormality. No hydronephrosis or bladder distention.   Electronically Signed   By: Marcello Moores  Register   On: 01/27/2014 13:40    Scheduled Meds: . clonazePAM  0.5 mg Oral BID  . DULoxetine  60 mg Oral Daily  . heparin  5,000 Units Subcutaneous Q8H  . sodium chloride  3 mL Intravenous Q12H   Continuous Infusions: .  sodium bicarbonate 150 mEq in sterile water 1000 mL infusion 125 mL/hr at 01/28/14 1011    Principal Problem:   ARF (acute renal failure) Active Problems:   Rhabdomyolysis   Dehydration   Fall   Dementia   Hypokalemia    Time spent: 30 minutes    [facility] Pager [phone]. If 7PM-7AM, please contact night-coverage at [email or web address], password [facility] 01/28/2014, 11:48 AM  LOS: 3 days

## 2014-01-28 NOTE — Progress Notes (Signed)
PT Cancellation Note  Patient Details Name: Rachel Dodson MRN: [id number] DOB: 03-12-1946   Cancelled Treatment:    Reason Eval/Treat Not Completed: Pain limiting ability to participate   Pt declined physical therapy this morning due to c/o headaches pain scale 8-9/10.  Pt left in bed with call bell within reach and nursing informed of headaches.   Aldona Lento 01/28/2014, 10:30 AM

## 2014-01-28 NOTE — Progress Notes (Signed)
Patient seen, independently examined and chart reviewed. I agree with exam, assessment and plan discussed with Dyanne Carrel, NP.  She complains of headache which is a chronic issue for her and very similar to previous headaches. No other issues.  She appears calm and comfortable. Speech is fluent and clear. Cardiovascular regular rate and rhythm. Respiratory clear to auscultation bilaterally. No wheezes, rales or rhonchi. Normal respiratory effort. Psychiatric grossly normal mood and affect. Speech fluent and appropriate. Alert, oriented to month, year, location, self.  Adequate urine output. Creatinine slowly improving, BUN also trending downwards.  Overall she is continuing to improve with treatment of rhabdomyolysis, acute renal failure with IV fluids and withholding of diuretics and ARB. We will continue IV fluids today. Likely discharge home tomorrow.  Murray Hodgkins, MD [facility] [phone]

## 2014-01-29 DIAGNOSIS — R197 Diarrhea, unspecified: Secondary | ICD-10-CM | POA: Diagnosis not present

## 2014-01-29 LAB — URINALYSIS, ROUTINE W REFLEX MICROSCOPIC
Bilirubin Urine: NEGATIVE
GLUCOSE, UA: NEGATIVE mg/dL
Hgb urine dipstick: NEGATIVE
KETONES UR: NEGATIVE mg/dL
Leukocytes, UA: NEGATIVE
Nitrite: NEGATIVE
PH: 7 (ref 5.0–8.0)
Protein, ur: NEGATIVE mg/dL
Specific Gravity, Urine: <1.005 — ABNORMAL LOW (ref 1.005–1.030)
Urobilinogen, UA: 0.2 mg/dL (ref 0.0–1.0)

## 2014-01-29 LAB — CK: Total CK: 596 U/L — ABNORMAL HIGH (ref 7–177)

## 2014-01-29 LAB — BASIC METABOLIC PANEL
BUN: 31 mg/dL — AB (ref 6–23)
CALCIUM: 8.1 mg/dL — AB (ref 8.4–10.5)
CO2: 27 mEq/L (ref 19–32)
CREATININE: 2.18 mg/dL — AB (ref 0.50–1.10)
Chloride: 99 mEq/L (ref 96–112)
GFR calc Af Amer: 26 mL/min — ABNORMAL LOW (ref >90)
GFR, EST NON AFRICAN AMERICAN: 22 mL/min — AB (ref >90)
GLUCOSE: 85 mg/dL (ref 70–99)
POTASSIUM: 3.7 meq/L (ref 3.7–5.3)
Sodium: 138 mEq/L (ref 137–147)

## 2014-01-29 LAB — CBC
HCT: 32.8 % — ABNORMAL LOW (ref 36.0–46.0)
Hemoglobin: 11.4 g/dL — ABNORMAL LOW (ref 12.0–15.0)
MCH: 31.4 pg (ref 26.0–34.0)
MCHC: 34.8 g/dL (ref 30.0–36.0)
MCV: 90.4 fL (ref 78.0–100.0)
PLATELETS: 151 10*3/uL (ref 150–400)
RBC: 3.63 MIL/uL — AB (ref 3.87–5.11)
RDW: 12.5 % (ref 11.5–15.5)
WBC: 5.2 10*3/uL (ref 4.0–10.5)

## 2014-01-29 MED ORDER — SODIUM CHLORIDE 0.9 % IV SOLN
INTRAVENOUS | Status: DC
  Administered 2014-01-29: 100 mL/h via INTRAVENOUS
  Administered 2014-01-30 (×3): via INTRAVENOUS

## 2014-01-29 NOTE — Progress Notes (Signed)
Notified Dyanne Carrel of the patient having at least 2 mash potato like consistency bm.    Late Entry

## 2014-01-29 NOTE — Progress Notes (Signed)
[facility] PROGRESS NOTE  Rachel Dodson GEZ:[id number] DOB: 11/09/1946 DOA: 01/25/2014 PCP: Glo Herring., MD  Assessment/Plan: Acute renal failure secondary to rhabdomyolysis complicated by diuretics and ARB. Creatinine continues to trend downward with IV fluids. Adequate urine output. Renal ultrasound unremarkable. Will continue to hold home ARB.   Rhabdomyolysis. Continues to improve with IV fluids, causes above. Will continue IV fluids at lower rate.   Diarrhea with nausea/vomiting: etiology uncertain. Abdominal exam benign. No recent antibiotics. Will check GI panel, obtain CBC. She is afebrile and non-toxic appearing. Continue zofran prn. Continue renal diet as well.   Fall home. Etiology unclear. Patient could not provide history. CT head and hip x-rays unremarkable. No evidence of bony injuries. 2-D echocardiogram was unremarkable. Xanax and oxycodone on hold.   Hypertension. Stable. SBP range 153-156.   Hypokalemia: repleted and resolved this am.  Monitor   Dementia. Appears stable.  History of empyema status post VATS 10/2013   Code Status: full Family Communication: none present Disposition Plan: home when ready   Consultants:  none  Procedures: 2-D echocardiogram: Left ventricular ejection fraction 65-20%. Normal motion. Grade 1 diastolic dysfunction   Antibiotics:  none  HPI/Subjective: Reports feeling nauseated and dry heaving this am. RN reports 2 loose stool. Denies abdominal pain.  Objective: Filed Vitals:   01/29/14 0414  BP: 156/62  Pulse: 66  Temp: 98.3 F (36.8 C)  Resp: 18    Intake/Output Summary (Last 24 hours) at 01/29/14 1224 Last data filed at 01/29/14 1127  Gross per 24 hour  Intake 2538.75 ml  Output   1055 ml  Net 1483.75 ml   Filed Weights   01/25/14 2228  Weight: 60.419 kg (133 lb 3.2 oz)    Exam:   General:  Calm appears comfortable  Cardiovascular: RRR No m/g/r. No LE edema  Respiratory: normal effort.  BS somewhat distant but clear to auscultation bilaterally. No wheeze  Abdomen: obese soft +BS throughout, non-tender to palpation  Musculoskeletal: no clubbing or cyanosis   Data Reviewed: Basic Metabolic Panel:  Recent Labs Lab 01/25/14 1935 01/26/14 0450 01/27/14 0528 01/28/14 0447 01/29/14 0444  NA 131* 134* 136* 137 138  K 4.4 4.6 3.7 3.4* 3.7  CL 89* 95* 103 106 99  CO2 20 20 19  18* 27  GLUCOSE 172* 108* 98 90 85  BUN 66* 62* 53* 42* 31*  CREATININE 4.84* 4.47* 3.42* 2.76* 2.18*  CALCIUM 9.7 8.9 8.1* 7.9* 8.1*   Liver Function Tests:  Recent Labs Lab 01/25/14 1935 01/26/14 0450  AST 113* 108*  ALT 32 31  ALKPHOS 100 83  BILITOT 0.3 0.3  PROT 8.2 7.1  ALBUMIN 4.3 3.7    Recent Labs Lab 01/25/14 1935  LIPASE 21   No results found for this basename: AMMONIA,  in the last 168 hours CBC:  Recent Labs Lab 01/25/14 1935 01/26/14 0450 01/29/14 1150  WBC 8.4 7.9 5.2  NEUTROABS 7.4  --   --   HGB 14.8 13.2 11.4*  HCT 41.1 37.5 32.8*  MCV 87.4 89.1 90.4  PLT 199 181 151   Cardiac Enzymes:  Recent Labs Lab 01/25/14 1935 01/26/14 0450 01/27/14 0528 01/29/14 0444  CKTOTAL 5699* 4515* 2349* 596*  CKMB  --  59.2*  --   --   TROPONINI <0.30 <0.30  --   --    BNP (last 3 results)  Recent Labs  10/21/13 1123 01/25/14 1935  PROBNP 642.7* 3614.0*   CBG:  Recent Labs Lab 01/25/14 1925  GLUCAP 152*    Recent Results (from the past 240 hour(s))  URINE CULTURE     Status: None   Collection Time    01/25/14  9:48 PM      Result Value Range Status   Specimen Description URINE, CLEAN CATCH   Final   Special Requests NONE   Final   Culture  Setup Time     Final   Value: 01/26/2014 13:54     Performed at [facility]     Final   Value: 30,000 COLONIES/ML     Performed at [facility]   Culture     Final   Value: Multiple bacterial morphotypes present, none predominant. Suggest appropriate recollection if  clinically indicated.     Performed at [facility]   Report Status 01/27/2014 FINAL   Final     Studies: US Renal  01/27/2014   CLINICAL DATA:  Acute renal failure .  EXAM: RENAL/URINARY TRACT ULTRASOUND COMPLETE  COMPARISON:  None.  FINDINGS: Right Kidney:  Length: 10.1 cm. 10 x 8 mm simple cyst medial portion right kidney. Stable from prior CT . Kidney is slightly echogenic, this suggests possibility of chronic medical renal disease. No hydronephrosis.  Left Kidney:  Length: 10.1 cm. Kidney is slightly echogenic, suggesting possibility of chronic medical renal disease. No hydronephrosis.  Bladder:  Appears normal for degree of bladder distention.  IMPRESSION: Mild increased echogenicity of the kidneys cannot be excluded. Possibility of chronic medical renal disease should be considered. No acute abnormality. No hydronephrosis or bladder distention.   Electronically Signed   By: Marcello Moores  Register   On: 01/27/2014 13:40    Scheduled Meds: . clonazePAM  0.5 mg Oral BID  . DULoxetine  60 mg Oral Daily  . heparin  5,000 Units Subcutaneous Q8H  . sodium chloride  3 mL Intravenous Q12H   Continuous Infusions: .  sodium bicarbonate 150 mEq in sterile water 1000 mL infusion 125 mL/hr at 01/28/14 2155    Principal Problem:   ARF (acute renal failure) Active Problems:   Rhabdomyolysis   Dehydration   Fall   Dementia   Hypokalemia   Diarrhea    Time spent: 35 minutes    [facility] Pager [phone]. If 7PM-7AM, please contact night-coverage at [email or web address], password [facility] 01/29/2014, 12:24 PM  LOS: 4 days

## 2014-01-29 NOTE — Progress Notes (Signed)
Patient seen, independently examined and chart reviewed. I agree with exam, assessment and plan discussed with Dyanne Carrel, NP.  No headache this morning but she has developed nausea with dry heaves and no diarrhea.  She appears uncomfortable but not toxic. Speech is fluent and clear. Afebrile, vital signs are stable. Cardiovascular regular rate and rhythm. Respiratory clear to auscultation bilaterally. No wheezes, rales or rhonchi. Normal respiratory effort. Abdomen soft nontender nondistended. Skin no rash or induration. No lower extremity edema.  Stable urine output, multiple voids. Basic metabolic panel shows continued improvement in BUN and creatinine to 31/2.18. CK much improved, 596. Hemoglobin appears to be at baseline compared to previous studies.  In regard to her renal failure and rhabdomyolysis these have much improved with IV fluids. Spontaneous resolution is now expected. However she developed some dry heaves and some loose stools. She has not had any antibiotics. Monitor clinically overnight. She her abdomen is soft and nontender and I do not think any imaging would be useful at this point.  Murray Hodgkins, MD [facility] [phone]

## 2014-01-30 DIAGNOSIS — R197 Diarrhea, unspecified: Secondary | ICD-10-CM

## 2014-01-30 LAB — BASIC METABOLIC PANEL
BUN: 23 mg/dL (ref 6–23)
CHLORIDE: 95 meq/L — AB (ref 96–112)
CO2: 31 meq/L (ref 19–32)
CREATININE: 1.72 mg/dL — AB (ref 0.50–1.10)
Calcium: 7.7 mg/dL — ABNORMAL LOW (ref 8.4–10.5)
GFR calc Af Amer: 34 mL/min — ABNORMAL LOW (ref >90)
GFR calc non Af Amer: 29 mL/min — ABNORMAL LOW (ref >90)
Glucose, Bld: 108 mg/dL — ABNORMAL HIGH (ref 70–99)
Potassium: 3 mEq/L — ABNORMAL LOW (ref 3.7–5.3)
Sodium: 137 mEq/L (ref 137–147)

## 2014-01-30 MED ORDER — HYDRALAZINE HCL 20 MG/ML IJ SOLN
10.0000 mg | Freq: Once | INTRAMUSCULAR | Status: AC
  Administered 2014-01-30: 10 mg via INTRAVENOUS
  Filled 2014-01-30: qty 1

## 2014-01-30 MED ORDER — POTASSIUM CHLORIDE CRYS ER 20 MEQ PO TBCR
40.0000 meq | EXTENDED_RELEASE_TABLET | Freq: Once | ORAL | Status: AC
  Administered 2014-01-30: 40 meq via ORAL
  Filled 2014-01-30: qty 2

## 2014-01-30 MED ORDER — HYDRALAZINE HCL 20 MG/ML IJ SOLN: 5.0000 mg | Freq: Four times a day (QID) | INTRAMUSCULAR | Status: DC | PRN

## 2014-01-30 NOTE — Progress Notes (Signed)
Pt came out of room into hallway and refused to return to her room stating someone had put in her a rat infested room.  I informed her she was in the same room she had been in she became angry and stated if was not the same room and she wanted to leave.  I informed her the doctor would have to discharge her.  I asked her to return to her room and she refused stating I want my clothes to get dressed and go home.  I informed her I could not let her leave in her condition.  She voiced thoughts of myself or other staff members wanting to hurt her.  Nursing supervisor Toy Baker arrived and attempted to rationalize with the patient and she was still uncooperative.  I called her contact  Hannah Beat at her insistence and explained the situation to him in front of the patient.  I then transferred the call to her room so she could speak with Shanon Brow.  After her conversation with Shanon Brow I spoke with him and he stated she would have to stay and he would speak with her family in the morning.  Pt assisted back to bed and asked if she needed anything and she stated "no just get out".  Covers pulled up on pt and bed alarm activated.  Nursing staff to continue to monitor.

## 2014-01-30 NOTE — Progress Notes (Signed)
Pt is more cooperative this am.    When asked she is oriented to person and place and she is no longer paranoid and accepts assistance.  She was assisted to the [facility] and to brushed her teeth at the sink.  She complained of a headache and was given tylenol  per her orders.  Pt assisted back to bed and bed alarm reactivated.  Pt denies other complaints or needs at this time.  Nursing staff to continue to monitor

## 2014-01-30 NOTE — Progress Notes (Signed)
[facility] PROGRESS NOTE  Rachel Dodson DXI:[id number] DOB: 1946/08/25 DOA: 01/25/2014 PCP: Glo Herring., MD  Summary: 68 year old woman lives alone presented with history of fall for unknown duration. Admitted for acute renal failure, rhabdomyolysis secondary to prolonged immobility.  Assessment/Plan: 1. Acute renal failure secondary to rhabdomyolysis complicated by diuretics and ARB. Slowly improving with IV fluids. Renal ultrasound unremarkable. 2. Rhabdomyolysis, appears clinically resolved at this point. 3. Fall at home. Etiology unclear. Patient could not provide history. CT head and hip x-rays unremarkable. No evidence of bony injuries. 2-D echocardiogram unremarkable. Consider weaning Xanax and oxycodone at home. 4. Hypertension stable. Can likely resume ARB in the morning. 5. Dementia stable 6. Confusion overnight, acute encephalopathy. Resolved. Patient alert and oriented. Likely related to sundowning phenomenon. No further evaluation suggested. 7. Nausea, resolved. Tolerating diet. 8. Multiple episodes of diarrhea. Afebrile. No recent antibiotics. Nontoxic. Etiology unclear. C. difficile PCR and GI pathogen panel pending.   Renal failure improving and spontaneous recovery is anticipated. Main issue now is multiple stools per day, etiology and significance unclear. Seems to be improving per patient. No antibiotics. Rule out C. difficile. Home tomorrow if no further problems.   Replace potassium  BMP and CBC in the morning  Pending studies:   GI pathogen panel.  C. difficile PCR.  Code Status: full code DVT prophylaxis: heparin Family Communication: none present Disposition Plan: home  Murray Hodgkins, MD  [facility]  Pager [phone] If 7PM-7AM, please contact night-coverage at [email or web address], password [facility] 01/30/2014, 5:52 PM  LOS: 5 days   Consultants:  none  Procedures:  2-D echocardiogram: Left ventricular ejection fraction 65-20%. Normal  motion. Grade 1 diastolic dysfunction  HPI/Subjective: 5 episodes of diarrhea today. No nausea and diet has improved. No abdominal pain. No other complaints.  Objective: Filed Vitals:   01/30/14 0409 01/30/14 0630 01/30/14 0805 01/30/14 1438  BP: 181/80 182/86 175/95 185/83  Pulse: 69 73 97 85  Temp: 98.8 F (37.1 C)  98.4 F (36.9 C) 98.9 F (37.2 C)  TempSrc: Oral  Oral Oral  Resp: 20 20 20 20   Height:      Weight:      SpO2: 93% 93% 93% 94%    Intake/Output Summary (Last 24 hours) at 01/30/14 1752 Last data filed at 01/30/14 1235  Gross per 24 hour  Intake 1823.33 ml  Output    352 ml  Net 1471.33 ml     Filed Weights   01/25/14 2228  Weight: 60.419 kg (133 lb 3.2 oz)    Exam:   Afebrile, vital signs are stable. No hypoxia.  Cardiovascular regular rate and rhythm. No murmur, rub or gallop.  Respiratory clear to auscultation bilaterally. No wheezes, rales or rhonchi. Normal respiratory effort.  Abdomen soft nontender and nondistended.  Data Reviewed:  Potassium 3.0  Creatinine has improved, 1.72. BUN now normal.  Scheduled Meds: . clonazePAM  0.5 mg Oral BID  . DULoxetine  60 mg Oral Daily  . heparin  5,000 Units Subcutaneous Q8H  . sodium chloride  3 mL Intravenous Q12H   Continuous Infusions: . sodium chloride 100 mL/hr at 01/30/14 1400    Principal Problem:   ARF (acute renal failure) Active Problems:   Rhabdomyolysis   Dehydration   Fall   Dementia   Hypokalemia   Diarrhea   Time spent 20 minutes

## 2014-01-31 LAB — CLOSTRIDIUM DIFFICILE BY PCR: Toxigenic C. Difficile by PCR: NEGATIVE

## 2014-01-31 MED ORDER — ACETAMINOPHEN 325 MG PO TABS: 650.0000 mg | ORAL_TABLET | Freq: Four times a day (QID) | ORAL | Status: DC | PRN

## 2014-01-31 NOTE — Progress Notes (Signed)
Pt a/o.vss. Saline lock removed. Discharge instructions given. Family verbalized understanding on instructions. Pt left floor via wheelchair.

## 2014-01-31 NOTE — Discharge Summary (Signed)
Physician Discharge Summary  Rachel Dodson ZMO:[id number] DOB: [birth year] DOA: 01/25/2014  PCP: Glo Herring., MD  Admit date: 01/25/2014 Discharge date: 01/31/2014  Recommendations for Outpatient Follow-up:  1. Resolution of acute renal failure. 2. Consider weaning off Xanax or Klonopin. See discussion below. 3. Consider resuming Ziac as an outpatient as kidney function normalizes.    Follow-up Information   Follow up with [facility].   Contact information:   [address] [place] [address] [phone]       Follow up with Glo Herring., MD. Schedule an appointment as soon as possible for a visit in 1 week.   Specialty:  Internal Medicine   Contact information:   [address]  [place] [address] [phone]      Discharge Diagnoses:  1. Acute renal failure secondary to round milestones. 2. Rhabdomyolysis. 3. Fall at home. 4. Polypharmacy  Discharge Condition: Improved Disposition: Home with home health physical therapy, occupational therapy, RN  Diet recommendation: Heart healthy  Filed Weights   01/25/14 2228  Weight: 60.419 kg (133 lb 3.2 oz)    History of present illness:  68 year old woman lives alone presented with history of fall for unknown duration. Admitted for acute renal failure, rhabdomyolysis secondary to prolonged immobility.  Hospital Course:  Rhabdomyolysis and kidney function slowly improved with IV fluids and with holding of diuretics and ACE inhibitor. Etiology of fall at home unclear, workup negative as below, and consider medication effect/adverse reaction to benzodiazepines or narcotics. Now stable for discharge. Individual issues as below.  1. Acute renal failure secondary to rhabdomyolysis complicated by diuretics and ARB. Nearly resolved, expect spontaneous resolution at this point. Renal ultrasound unremarkable. 2. Rhabdomyolysis, appears clinically resolved. 3. Fall at home.  Etiology unclear. Patient could not provide history. CT head and hip x-rays unremarkable. No evidence of bony injuries. 2-D echocardiogram unremarkable. Consider weaning Xanax and oxycodone at home. 4. Hypertension stable.  5. Dementia stable 6. Acute encephalopathy. Resolved. Likely related to sundowning phenomenon. No further evaluation suggested. 7. Nausea, resolved. Tolerating diet. 8. Diarrhea. Improved. C. difficile negative. No fever. GI pathogen panel pending. Discharge home [facility] RN, PT, and OT  Can resume ARB. Hold diuretic until follow-up.  Continue Klonopin, recommend discontinuing Xanax. (on both as outpatient).  Has not required narcotics as inpatient, therefore recommend discontinuing outpatient oxycodone. Recommend Tylenol for pain.  Consultants:  PT: HH OT: HH  Procedures:  2-D echocardiogram: Left ventricular ejection fraction 65-20%. Normal motion. Grade 1 diastolic dysfunction  Discharge Instructions  Discharge Orders   Future Orders Complete By Expires   Diet - low sodium heart healthy  As directed    Discharge instructions  As directed    Comments:     Call physician or seek immediate medical attention for worsening of condition, fever, vomiting or diarrhea. Some of your blood pressure medications are on hold until you see your doctor (to allow kidney function to recover). It is recommended that you do not take both Xanax and Klonopin as these medications can cause sedation and falls. Speak with your doctor about these medications. Recommend Tylenol for pain. Narcotics can cause sedation and falls. Speak with your about your oxycodone.   Increase activity slowly  As directed        Medication List    STOP taking these medications       ALPRAZolam 1 MG tablet  Commonly known as:  XANAX     bisoprolol-hydrochlorothiazide 5-6.25 MG per tablet  Commonly  known as:  ZIAC     oxyCODONE 15 MG immediate release tablet  Commonly known as:  ROXICODONE      TAKE  these medications       acetaminophen 325 MG tablet  Commonly known as:  TYLENOL  Take 2 tablets (650 mg total) by mouth every 6 (six) hours as needed for mild pain (or Fever >/= 101).     clonazePAM 0.5 MG tablet  Commonly known as:  KLONOPIN  Take 0.5 mg by mouth 2 (two) times daily.     DULoxetine 60 MG capsule  Commonly known as:  CYMBALTA  Take 60 mg by mouth daily.     losartan 50 MG tablet  Commonly known as:  COZAAR  Take 50 mg by mouth daily.       Allergies  Allergen Reactions  . Latex     The results of significant diagnostics from this hospitalization (including imaging, microbiology, ancillary and laboratory) are listed below for reference.    Significant Diagnostic Studies: Dg Chest 1 View  01/25/2014   CLINICAL DATA:  Status post fall  EXAM: CHEST - 1 VIEW  COMPARISON:  PA and lateral chest x-ray at November 12, 2013.  FINDINGS: The cardiopericardial silhouette remains mildly enlarged. Since the previous study there has been clearing of the pleural effusion on the left. A large partially intrathoracic stomach is present in the midline and to the right. There is no pneumothorax nor significant pleural effusion today. There is no alveolar pneumonia. The observed portions of the bony thorax appear normal.  IMPRESSION: There has been interval clearing of the left pleural effusion. There is no evidence of pneumonia nor CHF. A large partially intrathoracic stomach is again demonstrated.   Electronically Signed   By: David  Martinique   On: 01/25/2014 20:23   Dg Hip Bilateral W/pelvis  01/25/2014   CLINICAL DATA:  Unwitnessed fall  EXAM: BILATERAL HIP WITH PELVIS - 4+ VIEW  COMPARISON:  Lumbar spine series of November 26, 2013.  FINDINGS: The bony pelvis appears adequately mineralized. There is no evidence of an acute fracture. AP and lateral views of the left hip reveal the joint space to be preserved. There is no acute fracture or dislocation. The overlying soft tissues appear  normal.  IMPRESSION: There is no evidence of a left hip fracture or fracture of the bony pelvis.   Electronically Signed   By: David  Martinique   On: 01/25/2014 20:24   Ct Head Wo Contrast  01/25/2014   CLINICAL DATA:  Found unresponsive on the floor earlier today, current history of dementia.  EXAM: CT HEAD WITHOUT CONTRAST  TECHNIQUE: Contiguous axial images were obtained from the base of the skull through the vertex without intravenous contrast.  COMPARISON:  10/22/2013.  FINDINGS: Ventricular system normal in size and appearance for age. Stable mild cortical atrophy. No mass lesion. No midline shift. No acute hemorrhage or hematoma. No extra-axial fluid collections. No evidence of acute infarction. No significant interval change.  No focal osseous abnormality involving the skull. Small air-fluid levels in the maxillary sinuses and, to a lesser degree, the right sphenoid sinus. Bilateral mastoid air cells and middle ear cavities well aerated. Marked bony nasal septal deviation to the right.  IMPRESSION: 1. No acute intracranial abnormality. 2. Stable mild cortical atrophy. 3. Mild acute bilateral maxillary and right sphenoid sinus disease.   Electronically Signed   By: Evangeline Dakin M.D.   On: 01/25/2014 20:34   US Renal  01/27/2014  CLINICAL DATA:  Acute renal failure .  EXAM: RENAL/URINARY TRACT ULTRASOUND COMPLETE  COMPARISON:  None.  FINDINGS: Right Kidney:  Length: 10.1 cm. 10 x 8 mm simple cyst medial portion right kidney. Stable from prior CT . Kidney is slightly echogenic, this suggests possibility of chronic medical renal disease. No hydronephrosis.  Left Kidney:  Length: 10.1 cm. Kidney is slightly echogenic, suggesting possibility of chronic medical renal disease. No hydronephrosis.  Bladder:  Appears normal for degree of bladder distention.  IMPRESSION: Mild increased echogenicity of the kidneys cannot be excluded. Possibility of chronic medical renal disease should be considered. No acute  abnormality. No hydronephrosis or bladder distention.   Electronically Signed   By: Marcello Moores  Register   On: 01/27/2014 13:40    Microbiology: Recent Results (from the past 240 hour(s))  URINE CULTURE     Status: None   Collection Time    01/25/14  9:48 PM      Result Value Range Status   Specimen Description URINE, CLEAN CATCH   Final   Special Requests NONE   Final   Culture  Setup Time     Final   Value: 01/26/2014 13:54     Performed at [facility]     Final   Value: 30,000 COLONIES/ML     Performed at [facility]   Culture     Final   Value: Multiple bacterial morphotypes present, none predominant. Suggest appropriate recollection if clinically indicated.     Performed at [facility]   Report Status 01/27/2014 FINAL   Final  CLOSTRIDIUM DIFFICILE BY PCR     Status: None   Collection Time    01/31/14  2:25 AM      Result Value Range Status   C difficile by pcr NEGATIVE  NEGATIVE Final     Labs: Basic Metabolic Panel:  Recent Labs Lab 01/26/14 0450 01/27/14 0528 01/28/14 0447 01/29/14 0444 01/30/14 0552  NA 134* 136* 137 138 137  K 4.6 3.7 3.4* 3.7 3.0*  CL 95* 103 106 99 95*  CO2 20 19 18* 27 31  GLUCOSE 108* 98 90 85 108*  BUN 62* 53* 42* 31* 23  CREATININE 4.47* 3.42* 2.76* 2.18* 1.72*  CALCIUM 8.9 8.1* 7.9* 8.1* 7.7*   Liver Function Tests:  Recent Labs Lab 01/25/14 1935 01/26/14 0450  AST 113* 108*  ALT 32 31  ALKPHOS 100 83  BILITOT 0.3 0.3  PROT 8.2 7.1  ALBUMIN 4.3 3.7    Recent Labs Lab 01/25/14 1935  LIPASE 21   CBC:  Recent Labs Lab 01/25/14 1935 01/26/14 0450 01/29/14 1150  WBC 8.4 7.9 5.2  NEUTROABS 7.4  --   --   HGB 14.8 13.2 11.4*  HCT 41.1 37.5 32.8*  MCV 87.4 89.1 90.4  PLT 199 181 151   Cardiac Enzymes:  Recent Labs Lab 01/25/14 1935 01/26/14 0450 01/27/14 0528 01/29/14 0444  CKTOTAL 5699* 4515* 2349* 596*  CKMB  --  59.2*  --   --   TROPONINI <0.30 <0.30  --   --      Recent Labs  10/21/13 1123 01/25/14 1935  PROBNP 642.7* 3614.0*   CBG:  Recent Labs Lab 01/25/14 1925  GLUCAP 152*    Principal Problem:   ARF (acute renal failure) Active Problems:   Rhabdomyolysis   Dehydration   Fall   Dementia   Hypokalemia   Diarrhea   Time coordinating discharge: 25 minutes.  Signed:  Murray Hodgkins, MD [facility] 01/31/2014, 9:56 AM

## 2014-01-31 NOTE — Progress Notes (Signed)
   Rachel Dodson UUV:[id number] DOB: [birth year] DOA: 01/25/2014 PCP: Glo Herring., MD  Summary: 68 year old woman lives alone presented with history of fall for unknown duration. Admitted for acute renal failure, rhabdomyolysis secondary to prolonged immobility.  Assessment/Plan: 1. Acute renal failure secondary to rhabdomyolysis complicated by diuretics and ARB. Nearly resolved, expect spontaneous resolution at this point. Renal ultrasound unremarkable. 2. Rhabdomyolysis, appears clinically resolved. 3. Fall at home. Etiology unclear. Patient could not provide history. CT head and hip x-rays unremarkable. No evidence of bony injuries. 2-D echocardiogram unremarkable. Consider weaning Xanax and oxycodone at home. 4. Hypertension stable.  5. Dementia stable 6. Acute encephalopathy. Resolved. Likely related to sundowning phenomenon. No further evaluation suggested. 7. Nausea, resolved. Tolerating diet. 8. Diarrhea. Improved. C. difficile negative. No fever. GI pathogen panel pending.   Discharge home [facility] RN, PT, and OT   Can resume ARB. Hold diuretic until follow-up.  Continue Klonopin, recommend discontinuing Xanax. (on both as outpatient).  Has not required narcotics as inpatient, therefore recommend discontinuing outpatient oxycodone. Recommend Tylenol for pain.  Pending studies:   GI pathogen panel.  Code Status: full code DVT prophylaxis: heparin Family Communication: none present Disposition Plan: home  Murray Hodgkins, MD  [facility]  Pager [phone] If 7PM-7AM, please contact night-coverage at [email or web address], password [facility] 01/31/2014, 9:46 AM  LOS: 6 days   Consultants:  none  Procedures:  2-D echocardiogram: Left ventricular ejection fraction 65-20%. Normal motion. Grade 1 diastolic dysfunction  HPI/Subjective: Diarrhea has improved. C. difficile negative. No vomiting. Wants to go home.  Objective: Filed Vitals:   01/30/14 0805 01/30/14 1438 01/30/14  1849 01/30/14 2037  BP: 175/95 185/83 174/92 176/86  Pulse: 97 85 82 96  Temp: 98.4 F (36.9 C) 98.9 F (37.2 C)  97.3 F (36.3 C)  TempSrc: Oral Oral  Oral  Resp: 20 20  20   Height:      Weight:      SpO2: 93% 94%  98%    Intake/Output Summary (Last 24 hours) at 01/31/14 0946 Last data filed at 01/31/14 0820  Gross per 24 hour  Intake   2623 ml  Output      0 ml  Net   2623 ml     Filed Weights   01/25/14 2228  Weight: 60.419 kg (133 lb 3.2 oz)    Exam:   Afebrile, vital signs are stable. No hypoxia.  Gen. Appears calm and comfortable.  Cardiovascular regular rate and rhythm. No murmur, rub or gallop.  Respiratory. Clear to auscultation bilaterally. No wheezes, rales or rhonchi. Normal respiratory effort.  Abdomen benign.  Data Reviewed:  No new data  Scheduled Meds: . clonazePAM  0.5 mg Oral BID  . DULoxetine  60 mg Oral Daily  . heparin  5,000 Units Subcutaneous Q8H  . sodium chloride  3 mL Intravenous Q12H   Continuous Infusions:    Principal Problem:   ARF (acute renal failure) Active Problems:   Rhabdomyolysis   Dehydration   Fall   Dementia   Hypokalemia   Diarrhea

## 2014-02-01 LAB — GI PATHOGEN PANEL BY PCR, STOOL
C difficile toxin A/B: NEGATIVE
Campylobacter by PCR: NEGATIVE
Cryptosporidium by PCR: NEGATIVE
E COLI (ETEC) LT/ST: NEGATIVE
E coli (STEC): NEGATIVE
E coli 0157 by PCR: NEGATIVE
G lamblia by PCR: NEGATIVE
Norovirus GI/GII: NEGATIVE
ROTAVIRUS A BY PCR: NEGATIVE
Salmonella by PCR: NEGATIVE
Shigella by PCR: NEGATIVE

## 2014-02-25 ENCOUNTER — Other Ambulatory Visit: Payer: Self-pay | Admitting: *Deleted

## 2014-02-25 DIAGNOSIS — R911 Solitary pulmonary nodule: Secondary | ICD-10-CM

## 2014-03-19 ENCOUNTER — Other Ambulatory Visit: Payer: Medicare Other

## 2014-03-19 ENCOUNTER — Ambulatory Visit: Payer: Medicare Other | Admitting: Cardiothoracic Surgery

## 2014-03-24 ENCOUNTER — Other Ambulatory Visit: Payer: Medicare Other

## 2014-03-24 ENCOUNTER — Ambulatory Visit
Admission: RE | Admit: 2014-03-24 | Discharge: 2014-03-24 | Disposition: A | Discharge status: Home / Self Care | Payer: Medicare Other | Source: Ambulatory Visit | Attending: Cardiothoracic Surgery | Admitting: Cardiothoracic Surgery

## 2014-03-24 DIAGNOSIS — R911 Solitary pulmonary nodule: Secondary | ICD-10-CM

## 2014-04-01 ENCOUNTER — Ambulatory Visit (INDEPENDENT_AMBULATORY_CARE_PROVIDER_SITE_OTHER): Payer: Medicare Other | Admitting: Cardiothoracic Surgery

## 2014-04-01 ENCOUNTER — Encounter: Payer: Self-pay | Admitting: Cardiothoracic Surgery

## 2014-04-01 VITALS — BP 148/75 | HR 56 | Resp 18 | Ht 61.5 in | Wt 133.0 lb

## 2014-04-01 DIAGNOSIS — J869 Pyothorax without fistula: Secondary | ICD-10-CM

## 2014-04-01 DIAGNOSIS — R911 Solitary pulmonary nodule: Secondary | ICD-10-CM

## 2014-04-01 DIAGNOSIS — J90 Pleural effusion, not elsewhere classified: Secondary | ICD-10-CM

## 2014-04-01 NOTE — Progress Notes (Signed)
[facility] Medical Record #[id number] Date of Birth: 02/03/1946  [employer], MD Glo Herring., MD  Chief Complaint:   PostOp Follow Up Visit 10/23/2013  OPERATIVE REPORT  PREOPERATIVE DIAGNOSIS: Complex large left pleural effusion, probable  empyema.  POSTOPERATIVE DIAGNOSIS: Complex large left pleural effusion, probable  empyema.  SURGICAL PROCEDURE: Bronchoscopy, left video-assisted thoracoscopy,  mini thoracotomy, decortication of the left lung and drainage of  empyema.  SURGEON: Lanelle Bal, MD  History of Present Illness:     Patient returns for followup after discharge  by pulmonology service because of respiratory failure, sepsis, right lung mass, left effusion and left empyema in November 2014. At the end of October she underwent drainage of left pleural effusion/empyema and was continued on antibiotics for extensive lower lobe pneumonia and atelectasis. Cultures were negative at the time of surgery, pathology did not reveal any malignancy in the material removed from the left chest. Preop CT scan of the chest showed a right lower lobe lung nodule. Since that time a PET scan has been performed.  Patient returns to the office today after a followup CT scan of the chest  was performed to evaluate the right lower lobe lung nodule.  The patient has remained off of cigarettes since last fall. She does note a recent admission to the hospital because of a fall.  History  Smoking status  . Former Smoker -- 0.75 packs/day for 15 years  . Types: Cigarettes  . Quit date: 08/24/2013  Smokeless tobacco  . Never Used        Allergies  Allergen Reactions  . Latex     Current Outpatient Prescriptions  Medication Sig Dispense Refill  . acetaminophen (TYLENOL) 325 MG tablet Take 2 tablets (650 mg total) by mouth every 6 (six) hours as needed for mild pain (or Fever >/= 101).      . clonazePAM (KLONOPIN) 0.5 MG tablet Take 0.5 mg by mouth 2  (two) times daily.      . DULoxetine (CYMBALTA) 60 MG capsule Take 60 mg by mouth daily.      Marland Kitchen losartan (COZAAR) 50 MG tablet Take 50 mg by mouth daily.       No current facility-administered medications for this visit.       Physical Exam: BP 148/75  Pulse 56  Resp 18  Ht 5' 1.5" (1.562 m)  Wt 133 lb (60.328 kg)  BMI 24.73 kg/m2  SpO2 97%  General appearance: alert and cooperative Neurologic: intact Heart: regular rate and rhythm, S1, S2 normal, no murmur, click, rub or gallop Lungs: diminished breath sounds LLL Abdomen: soft, non-tender; bowel sounds normal; no masses,  no organomegaly Extremities: extremities normal, atraumatic, no cyanosis or edema and Homans sign is negative, no sign of DVT Wound: The left chest incision and chest tube sites are well-healed  No cervical or supraclavicular adenopathy  Diagnostic Studies & Laboratory data:          Recent Radiology Findings: Ct Chest Wo Contrast  03/24/2014   CLINICAL DATA:  Chest pain, follow-up lung nodule  EXAM: CT CHEST WITHOUT CONTRAST  TECHNIQUE: Multidetector CT imaging of the chest was performed following the standard protocol without IV contrast.  COMPARISON:  PET-CT dated 12/02/2013, CT chest dated 11/16/2013, and CT chest dated 10/20/2013  FINDINGS: 13 x 12 mm lobulated nodule in the superior segment right lower lobe (series 4/ image 22). Suspected intralesional fat (series 3/image 22). This appearance, coupled with  the lack of hypermetabolism on PET, favors a benign hamartoma.  No new/suspicious pulmonary nodules. Trace left pleural scarring/thickening, likely related to prior VATS. No pleural effusion or pneumothorax.  Visualized thyroid is unremarkable.  The heart is normal in size. No pericardial effusion. Coronary atherosclerosis. Atherosclerotic calcifications of the aortic arch.  No suspicious mediastinal or axillary lymphadenopathy.  Bilateral breast augmentation.  Large hiatal hernia with inverted intrathoracic  stomach.  Visualized upper abdomen is otherwise unremarkable.  Degenerative changes of the visualized thoracolumbar spine. Exaggerated thoracic kyphosis.  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 Five month stability has been demonstrated.  Follow-up CT chest is suggested in approximately 6 months (9-12 months from initial CT).  This recommendation follows the consensus statement: Guidelines for Management of Small Pulmonary Nodules Detected on CT Scans: A Statement from the [place] as published in Radiology 2005; 237:395-400.   Electronically Signed   By: Julian Hy M.D.   On: 03/24/2014 14:43     Ct Chest W Contrast  11/16/2013   CLINICAL DATA:  Pleural effusion, abnormal chest x-ray, history hypertension, breast implants ; history of prior exam indicates follow-up empyema  EXAM: CT CHEST WITH CONTRAST  TECHNIQUE: Multidetector CT imaging of the chest was performed during intravenous contrast administration. Sagittal and coronal MPR images reconstructed from axial data set.  CONTRAST:  63mL OMNIPAQUE IOHEXOL 300 MG/ML  SOLN  COMPARISON:  10/30/2013  Correlation:  Chest radiograph 11/12/2013  FINDINGS: Atherosclerotic calcifications aorta without aneurysm or dissection on nondedicated exam.  Mitral annular calcification present.  Large hiatal hernia.  Visualized portion of upper abdomen unremarkable.  Bilateral breast prostheses, demonstrating capsular calcification greater on right.  No thoracic adenopathy.  Partially loculated pleural effusion with pleural thickening at the inferior left hemi thorax, decreased in size since previous exam, by history, patient underwent VATS on 10/23/2013 and per history of prior CT had an empyema.  Associated atelectasis in left lower lobe.  Lobulated 13 x 12 x 11 mm nodule superior segment right lower lobe, previously 13 x 12 x 10 mm.  Mild compressive atelectasis of  right lower lobe by large hiatal hernia.  No additional pulmonary mass or nodule.  No pneumothorax.  Fracture lateral left 6th rib image 31.  Bones appear demineralized with scattered degenerative disc disease changes and a mild kyphosis of the mid thoracic spine.  IMPRESSION: Decrease in loculated pleural effusion in atelectasis at left base since previous exam ; these changes likely reflect sequela of prior VATS and thoracostomy tubes as well as empyema.  Slightly improved aeration at left lung base since previous exam.  Stable size of 13 x 12 x 11 mm diameter lobulated soft tissue nodule in the superior segment of the left lower lobe.   Electronically Signed   By: Lavonia Dana M.D.   On: 11/16/2013 17:03      Recent Labs: Lab Results  Component Value Date   WBC 5.2 01/29/2014   HGB 11.4* 01/29/2014   HCT 32.8* 01/29/2014   PLT 151 01/29/2014   GLUCOSE 108* 01/30/2014   ALT 31 01/26/2014   AST 108* 01/26/2014   NA 137 01/30/2014   K 3.0* 01/30/2014   CL 95* 01/30/2014   CREATININE 1.72* 01/30/2014   BUN 23 01/30/2014   CO2 31 01/30/2014   INR 0.97 01/25/2014      Assessment / Plan:   #1 cleared left pleural effusion on chest x-ray, without active  evidence of infection #2 right lower lobe lung nodule appears to have decreased in size from 1.4  To 1 cm. The nodule is not hypermetabolic and unchanged in size over the past 5 months. Repeat CT scan of the chest will be done in 6 months.    Grace Isaac 04/01/2014 1:17 PM

## 2014-06-14 ENCOUNTER — Other Ambulatory Visit (HOSPITAL_COMMUNITY): Payer: Self-pay | Admitting: Internal Medicine

## 2014-06-14 DIAGNOSIS — Z0000 Encounter for general adult medical examination without abnormal findings: Secondary | ICD-10-CM

## 2014-06-17 ENCOUNTER — Ambulatory Visit (HOSPITAL_COMMUNITY): Payer: Medicare Other

## 2014-06-18 ENCOUNTER — Ambulatory Visit (HOSPITAL_COMMUNITY): Payer: Medicare Other

## 2014-06-21 ENCOUNTER — Other Ambulatory Visit (HOSPITAL_COMMUNITY): Payer: Self-pay | Admitting: Internal Medicine

## 2014-06-21 DIAGNOSIS — Z1231 Encounter for screening mammogram for malignant neoplasm of breast: Secondary | ICD-10-CM

## 2014-06-24 ENCOUNTER — Ambulatory Visit (HOSPITAL_COMMUNITY): Payer: Medicare Other

## 2014-06-28 ENCOUNTER — Ambulatory Visit (HOSPITAL_COMMUNITY): Payer: Medicare Other

## 2014-07-05 ENCOUNTER — Ambulatory Visit (HOSPITAL_COMMUNITY): Payer: Medicare Other

## 2014-07-28 ENCOUNTER — Ambulatory Visit (HOSPITAL_COMMUNITY): Payer: Medicare Other

## 2014-07-30 ENCOUNTER — Encounter (HOSPITAL_COMMUNITY): Payer: Self-pay | Admitting: Emergency Medicine

## 2014-07-30 ENCOUNTER — Emergency Department (HOSPITAL_COMMUNITY): Payer: Medicare Other

## 2014-07-30 ENCOUNTER — Emergency Department (HOSPITAL_COMMUNITY)
Admission: EM | Admit: 2014-07-30 | Discharge: 2014-07-30 | Disposition: A | Discharge status: Home / Self Care | Payer: Medicare Other | Attending: Emergency Medicine | Admitting: Emergency Medicine

## 2014-07-30 DIAGNOSIS — S61209A Unspecified open wound of unspecified finger without damage to nail, initial encounter: Secondary | ICD-10-CM | POA: Diagnosis present

## 2014-07-30 DIAGNOSIS — S62639A Displaced fracture of distal phalanx of unspecified finger, initial encounter for closed fracture: Secondary | ICD-10-CM | POA: Insufficient documentation

## 2014-07-30 DIAGNOSIS — Z87891 Personal history of nicotine dependence: Secondary | ICD-10-CM | POA: Diagnosis not present

## 2014-07-30 DIAGNOSIS — I10 Essential (primary) hypertension: Secondary | ICD-10-CM | POA: Diagnosis not present

## 2014-07-30 DIAGNOSIS — R296 Repeated falls: Secondary | ICD-10-CM | POA: Diagnosis not present

## 2014-07-30 DIAGNOSIS — S62609A Fracture of unspecified phalanx of unspecified finger, initial encounter for closed fracture: Secondary | ICD-10-CM

## 2014-07-30 DIAGNOSIS — Z23 Encounter for immunization: Secondary | ICD-10-CM | POA: Diagnosis not present

## 2014-07-30 DIAGNOSIS — F411 Generalized anxiety disorder: Secondary | ICD-10-CM | POA: Insufficient documentation

## 2014-07-30 DIAGNOSIS — Y929 Unspecified place or not applicable: Secondary | ICD-10-CM | POA: Diagnosis not present

## 2014-07-30 DIAGNOSIS — F329 Major depressive disorder, single episode, unspecified: Secondary | ICD-10-CM | POA: Insufficient documentation

## 2014-07-30 DIAGNOSIS — Z91040 Latex allergy status: Secondary | ICD-10-CM | POA: Insufficient documentation

## 2014-07-30 DIAGNOSIS — F3289 Other specified depressive episodes: Secondary | ICD-10-CM | POA: Insufficient documentation

## 2014-07-30 DIAGNOSIS — S61219A Laceration without foreign body of unspecified finger without damage to nail, initial encounter: Secondary | ICD-10-CM

## 2014-07-30 DIAGNOSIS — Y9389 Activity, other specified: Secondary | ICD-10-CM | POA: Diagnosis not present

## 2014-07-30 DIAGNOSIS — Z79899 Other long term (current) drug therapy: Secondary | ICD-10-CM | POA: Insufficient documentation

## 2014-07-30 MED ORDER — TETANUS-DIPHTH-ACELL PERTUSSIS 5-2.5-18.5 LF-MCG/0.5 IM SUSP
0.5000 mL | Freq: Once | INTRAMUSCULAR | Status: AC
  Administered 2014-07-30: 0.5 mL via INTRAMUSCULAR
  Filled 2014-07-30: qty 0.5

## 2014-07-30 MED ORDER — BUPIVACAINE HCL (PF) 0.25 % IJ SOLN
10.0000 mL | Freq: Once | INTRAMUSCULAR | Status: AC
  Administered 2014-07-30: 10 mL
  Filled 2014-07-30: qty 30

## 2014-07-30 MED ORDER — HYDROCODONE-ACETAMINOPHEN 5-325 MG PO TABS
1.0000 | ORAL_TABLET | Freq: Once | ORAL | Status: AC
  Administered 2014-07-30: 1 via ORAL
  Filled 2014-07-30: qty 1

## 2014-07-30 NOTE — ED Provider Notes (Signed)
CSN: [id number]     Arrival date & time 07/30/14  2029 History  This chart was scribed for Richarda Blade, MD by Steva Colder, ED Scribe. The patient was seen in room APFT23/APFT23 at 8:55 PM.    Chief Complaint  Patient presents with  . Fall  . Laceration     The history is provided by the patient. No language interpreter was used.   Rachel Dodson is a 68 y.o. female who was brought in by parents to the ED complaining of a fall and laceration onset Thursday morning. She states that she fell around 3 AM. She states that she does not know what she landed on. She states that she tried looking around and finding what she may have cut herself on, but was unsuccessful. She states that she did not have a way to come to the ED when the incident first occurred. She denies any other associated symptoms. She states that it has been awhile since her last tetanus shot. She states that she does not have DM. She states that she has a medical hx of osteoporosis. She states that she got a ride with her friend to the ED today.    Past Medical History  Diagnosis Date  . Hypertension   . Depression   . Anxiety    Past Surgical History  Procedure Laterality Date  . Abdominal hysterectomy    . Video bronchoscopy N/A 10/23/2013    Procedure: VIDEO BRONCHOSCOPY;  Surgeon: Grace Isaac, MD;  Location: [place];  Service: Thoracic;  Laterality: N/A;  . Video assisted thoracoscopy Left 10/23/2013    Procedure: VIDEO ASSISTED THORACOSCOPY;  Surgeon: Grace Isaac, MD;  Location: [place];  Service: Thoracic;  Laterality: Left;   History reviewed. No pertinent family history. History  Substance Use Topics  . Smoking status: Former Smoker -- 0.75 packs/day for 15 years    Types: Cigarettes    Quit date: 08/24/2013  . Smokeless tobacco: Never Used  . Alcohol Use: No   OB History   Grav Para Term Preterm Abortions TAB SAB Ect Mult Living                 Review of Systems  Skin: Positive for wound  (laceration to the left hand).  All other systems reviewed and are negative.   Allergies  Latex  Home Medications   Prior to Admission medications   Medication Sig Start Date End Date Taking? Authorizing Provider  acetaminophen (TYLENOL) 325 MG tablet Take 2 tablets (650 mg total) by mouth every 6 (six) hours as needed for mild pain (or Fever >/= 101). 01/31/14   Samuella Cota, MD  clonazePAM (KLONOPIN) 0.5 MG tablet Take 0.5 mg by mouth 2 (two) times daily.    Historical Provider, MD  DULoxetine (CYMBALTA) 60 MG capsule Take 60 mg by mouth daily. 09/29/13   Historical Provider, MD  losartan (COZAAR) 50 MG tablet Take 50 mg by mouth daily. 09/29/13   Historical Provider, MD   BP 152/76  Pulse 78  Temp(Src) 99 F (37.2 C) (Oral)  Resp 18  Ht 5\' 4"  (1.626 m)  SpO2 97%  Physical Exam  Nursing note and vitals reviewed. Constitutional: She is oriented to person, place, and time. She appears well-developed and well-nourished.  HENT:  Head: Normocephalic and atraumatic.  Eyes: Conjunctivae and EOM are normal. Pupils are equal, round, and reactive to light.  Neck: Normal range of motion and phonation normal. Neck supple.  Cardiovascular:  Normal rate, regular rhythm and intact distal pulses.   Pulmonary/Chest: Effort normal and breath sounds normal. She exhibits no tenderness.  Abdominal: Soft. She exhibits no distension. There is no tenderness. There is no guarding.  Musculoskeletal: Normal range of motion.  Neurological: She is alert and oriented to person, place, and time. She exhibits normal muscle tone.  Skin: Skin is warm and dry.  Left third finger with a gaping laceration and deformity of the pad. Left fourth finger with a distal tip amputation with a fingertip avulsion. Left third DIP is swollen.   Psychiatric: She has a normal mood and affect. Her behavior is normal. Judgment and thought content normal.    ED Course  Procedures (including critical care time) DIAGNOSTIC  STUDIES: Oxygen Saturation is 97% on room air, normal by my interpretation.    COORDINATION OF CARE: 9:01 PM-Discussed treatment plan which includes laceration repair, Tdap injection, and left hand X-ray with pt at bedside and pt agreed to plan.   Medications  Tdap (BOOSTRIX) injection 0.5 mL (0.5 mLs Intramuscular Given 07/30/14 2129)  HYDROcodone-acetaminophen (NORCO/VICODIN) 5-325 MG per tablet 1 tablet (1 tablet Oral Given 07/30/14 2129)  bupivacaine (PF) (MARCAINE) 0.25 % injection 10 mL (10 mLs Infiltration Given by Other 07/30/14 2240)    Patient Vitals for the past 24 hrs:  BP Temp Temp src Pulse Resp SpO2 Height  07/30/14 2322 141/72 mmHg - - 65 16 98 % -  07/30/14 2033 152/76 mmHg 99 F (37.2 C) Oral 78 18 97 % 5\' 4"  (1.626 m)    10:29 PM Reevaluation with update and discussion. After initial assessment and treatment, an updated evaluation reveals there is a need for assessment for need of repair. Discussed digital block on the left fingers 3 and 4 with Sensorcaine. Rachel Dodson L   LACERATION REPAIR Performed by: Richarda Blade Consent: Verbal consent obtained. Risks and benefits: risks, benefits and alternatives were discussed Patient identity confirmed: provided demographic data Time out performed prior to procedure Prepped and Draped in normal sterile fashion Wound explored Laceration Location: left finger 3 Laceration Length: 3.0 cm distal based flap No Foreign Bodies seen or palpated Anesthesia: digital block Local anesthetic: sensorcaine 0.25 % without epinephrine Anesthetic total: 4 ml Irrigation method: syringe Amount of cleaning: standard Skin closure: 3-0 Prolene Number of sutures or staples: 1 Technique: DELAYED  loose closure- in effort to improve healing Patient tolerance: Patient tolerated the procedure well with no immediate complications.  Assessment of left fourth finger after Sensorcaine digital block: After anesthesia the wound was scrubbed with  Betadine. The distal tip is avulsed. The avulsion goes through the tip of the distal nail bed. There is no visible bone within the wound. There is no bleeding from the nail bed. The wound was assessed as not needing closure.  Findings discussed with the patient, and a friend, who is with her. All questions answered.  Labs Review Labs Reviewed - No data to display  Imaging Review Dg Hand Complete Left  07/30/2014   CLINICAL DATA:  Left hand pain, about the third and fourth digits, status post fall. Swelling and laceration.  EXAM: LEFT HAND - COMPLETE 3+ VIEW  COMPARISON:  None.  FINDINGS: There appears to be partial absence of the distal fourth digit, with partial absence of the distal tuft, reflecting partial amputation. There is also a slightly comminuted fracture involving the distal tuft of the third digit, with associated overlying soft tissue disruption.  No additional fractures are seen. Visualized joint spaces are  preserved. Soft tissue swelling is suggested about the metacarpophalangeal joints.  IMPRESSION: Partial amputation of the distal fourth digit, with partial absence of the distal tuft and overlying soft tissues. Slightly comminuted fracture involving the distal tuft of the third digit, with overlying soft tissue disruption.   Electronically Signed   By: Garald Balding M.D.   On: 07/30/2014 21:52      EKG Interpretation None      MDM   Final diagnoses:  Finger laceration, initial encounter  Fingertip avulsion, initial encounter  Finger fracture, closed, initial encounter    Fingertip injury, with avulsion, left fourth ring finger, and tuft fracture. The bone was not exposed. Therefore, the wound does not need to be closed. The left third finger has a cold wound that is treated with loose wound closure, as delayed procedure. This was done in an effort to improve the healing. The patient understands that this closure may not cause the flap to heal to the adjacent tissue. She  will need assessment of the wounds in 48 hours to assess for progression. She will be managed without antibiotics, at this time. The left fourth ring finger wound does not appear to have an open fracture.  Nursing Notes Reviewed/ Care Coordinated Applicable Imaging Reviewed Interpretation of Laboratory Data incorporated into ED treatment  The patient appears reasonably screened and/or stabilized for discharge and I doubt any other medical condition or other [facility] requiring further screening, evaluation, or treatment in the ED at this time prior to discharge.  Plan: Home Medications- none; Home Treatments- resr; return here if the recommended treatment, does not improve the symptoms; Recommended follow up- Return here in 2 days for a check up.     I personally performed the services described in this documentation, which was scribed in my presence. The recorded information has been reviewed and is accurate.     Richarda Blade, MD 07/30/14 [phone]

## 2014-07-30 NOTE — ED Notes (Signed)
[place] about two nights ago.  Injury to left finger tips (middle and ring fingers).

## 2014-07-30 NOTE — ED Notes (Signed)
Dressing applied by Dr. Eulis Foster.

## 2014-07-30 NOTE — ED Notes (Signed)
Dr. Wentz at bedside. 

## 2014-07-30 NOTE — Discharge Instructions (Signed)
Return here, for a checkup in 2 days. Keep the bandages on, and keep them clean and dry.    Delayed Wound Closure Sometimes, your health care provider will decide to delay closing a wound for several days. This is done when the wound is badly bruised, dirty, or when it has been several hours since the injury happened. By delaying the closure of your wound, the risk of infection is reduced. Wounds that are closed in 3-7 days after being cleaned up and dressed heal just as well as those that are closed right away. HOME CARE INSTRUCTIONS  Rest and elevate the injured area until the pain and swelling are gone.  Have your wound checked as instructed by your health care provider. SEEK MEDICAL CARE IF:  You develop unusual or increased swelling or redness around the wound.  You have increasing pain or tenderness.  There is increasing fluid (drainage) or a bad smelling drainage coming from the wound. Document Released: 12/10/2005 Document Revised: 12/15/2013 Document Reviewed: 06/09/2013 [facility] Patient Information 2015 [place], [place]. This information is not intended to replace advice given to you by your health care provider. Make sure you discuss any questions you have with your health care provider.

## 2014-08-02 ENCOUNTER — Encounter (HOSPITAL_COMMUNITY): Payer: Self-pay | Admitting: Emergency Medicine

## 2014-08-02 ENCOUNTER — Emergency Department (HOSPITAL_COMMUNITY)
Admission: EM | Admit: 2014-08-02 | Discharge: 2014-08-02 | Disposition: A | Discharge status: Home / Self Care | Payer: Medicare Other | Attending: Emergency Medicine | Admitting: Emergency Medicine

## 2014-08-02 DIAGNOSIS — Z4801 Encounter for change or removal of surgical wound dressing: Secondary | ICD-10-CM | POA: Diagnosis present

## 2014-08-02 DIAGNOSIS — IMO0001 Reserved for inherently not codable concepts without codable children: Secondary | ICD-10-CM | POA: Insufficient documentation

## 2014-08-02 DIAGNOSIS — F329 Major depressive disorder, single episode, unspecified: Secondary | ICD-10-CM | POA: Diagnosis not present

## 2014-08-02 DIAGNOSIS — S62609D Fracture of unspecified phalanx of unspecified finger, subsequent encounter for fracture with routine healing: Secondary | ICD-10-CM

## 2014-08-02 DIAGNOSIS — Z87891 Personal history of nicotine dependence: Secondary | ICD-10-CM | POA: Insufficient documentation

## 2014-08-02 DIAGNOSIS — F3289 Other specified depressive episodes: Secondary | ICD-10-CM | POA: Diagnosis not present

## 2014-08-02 DIAGNOSIS — S61219D Laceration without foreign body of unspecified finger without damage to nail, subsequent encounter: Secondary | ICD-10-CM

## 2014-08-02 DIAGNOSIS — F411 Generalized anxiety disorder: Secondary | ICD-10-CM | POA: Insufficient documentation

## 2014-08-02 DIAGNOSIS — Z91040 Latex allergy status: Secondary | ICD-10-CM | POA: Diagnosis not present

## 2014-08-02 DIAGNOSIS — Z79899 Other long term (current) drug therapy: Secondary | ICD-10-CM | POA: Insufficient documentation

## 2014-08-02 DIAGNOSIS — I10 Essential (primary) hypertension: Secondary | ICD-10-CM | POA: Insufficient documentation

## 2014-08-02 MED ORDER — CEPHALEXIN 500 MG PO CAPS: 500.0000 mg | ORAL_CAPSULE | Freq: Two times a day (BID) | ORAL | Status: DC

## 2014-08-02 NOTE — Discharge Instructions (Signed)

## 2014-08-02 NOTE — ED Provider Notes (Signed)
CSN: [id number]     Arrival date & time 08/02/14  0915 History  This chart was scribed for Sharyon Cable, MD by Ludger Nutting, ED Scribe. This patient was seen in room APA08/APA08 and the patient's care was started 9:53 AM.    Chief Complaint  Patient presents with  . Wound Check   Patient gave verbal permission to utilize photo for medical documentation only The image was not stored on any personal device   Patient is a 68 y.o. female presenting with wound check. The history is provided by the patient. No language interpreter was used.  Wound Check This is a new problem. The current episode started 2 days ago. The problem has been gradually improving. Nothing aggravates the symptoms. Nothing relieves the symptoms.    HPI Comments: Rachel Dodson is a 68 y.o. female who presents to the Emergency Department requesting a wound check after a laceration repair on 07/30/14. Patient states she injured her left middle and ring fingers after she fell in the dark. She is unsure what she may have cut herself on. She denies history of DM. She denies increased pain, swelling, or redness to the affected area. She denies fever, chills.   Past Medical History  Diagnosis Date  . Hypertension   . Depression   . Anxiety    Past Surgical History  Procedure Laterality Date  . Abdominal hysterectomy    . Video bronchoscopy N/A 10/23/2013    Procedure: VIDEO BRONCHOSCOPY;  Surgeon: Grace Isaac, MD;  Location: [place];  Service: Thoracic;  Laterality: N/A;  . Video assisted thoracoscopy Left 10/23/2013    Procedure: VIDEO ASSISTED THORACOSCOPY;  Surgeon: Grace Isaac, MD;  Location: [place];  Service: Thoracic;  Laterality: Left;   No family history on file. History  Substance Use Topics  . Smoking status: Former Smoker -- 0.75 packs/day for 15 years    Types: Cigarettes    Quit date: 08/24/2013  . Smokeless tobacco: Never Used  . Alcohol Use: No   OB History   Grav Para Term Preterm  Abortions TAB SAB Ect Mult Living                 Review of Systems  Constitutional: Negative for fever and chills.  Skin: Positive for wound.      Allergies  Latex  Home Medications   Prior to Admission medications   Medication Sig Start Date End Date Taking? Authorizing Provider  acetaminophen (TYLENOL) 325 MG tablet Take 2 tablets (650 mg total) by mouth every 6 (six) hours as needed for mild pain (or Fever >/= 101). 01/31/14  Yes Samuella Cota, MD  clonazePAM (KLONOPIN) 0.5 MG tablet Take 0.5 mg by mouth 2 (two) times daily as needed for anxiety.    Yes Historical Provider, MD  DULoxetine (CYMBALTA) 60 MG capsule Take 60 mg by mouth at bedtime.  09/29/13  Yes Historical Provider, MD  losartan (COZAAR) 50 MG tablet Take 50 mg by mouth daily. 09/29/13  Yes Historical Provider, MD  oxyCODONE (ROXICODONE) 15 MG immediate release tablet Take 15 mg by mouth 4 (four) times daily as needed. 06/14/14  Yes Historical Provider, MD  pantoprazole (PROTONIX) 40 MG tablet Take 40 mg by mouth 2 (two) times daily. 06/07/14  Yes Historical Provider, MD   BP 142/97  Pulse 109  Temp(Src) 98 F (36.7 C) (Oral)  Resp 18  Ht 5\' 3"  (1.6 m)  Wt 133 lb (60.328 kg)  BMI 23.57 kg/m2  SpO2 97% Physical Exam  Nursing note and vitals reviewed.  CONSTITUTIONAL: Well developed/well nourished HEAD: Normocephalic/atraumatic ENMT: Mucous membranes moist NECK: supple no meningeal signs NEURO: Pt is awake/alert, moves all extremitiesx4 EXTREMITIES: pulses normal, full ROM SKIN: see photo below PSYCH: no abnormalities of mood noted       ED Course  Procedures   Wounds healing No signs of acute wound infection.  No erythematous streaking noted Pt reports pain is controlled Advised nursing to place xeroform on right fingert over wound, and then to gauze wrap left middle finger.  Both fingers are to be splint.  Will start keflex and refer to ortho for f/u for wound check in 24-48 hours Pt agreeable  with plan We discussed strict ER return precautions  DIAGNOSTIC STUDIES: Oxygen Saturation is 97% on RA, adequate by my interpretation.    COORDINATION OF CARE: 10:01 AM Discussed treatment plan with pt which includes antibiotics and follow up with orthopedist. Return precautions given.      MDM   Final diagnoses:  Finger laceration, subsequent encounter  Finger fracture, left, with routine healing, subsequent encounter    Nursing notes including past medical history and social history reviewed and considered in documentation Previous records reviewed and considered   I personally performed the services described in this documentation, which was scribed in my presence. The recorded information has been reviewed and is accurate.      Sharyon Cable, MD 08/02/14 [phone]

## 2014-08-02 NOTE — ED Notes (Signed)
Pt reports cut 2 fingers Thursday morning and was treated here Fri night.  Reports was instructed to return today for recheck.

## 2014-08-05 ENCOUNTER — Ambulatory Visit (INDEPENDENT_AMBULATORY_CARE_PROVIDER_SITE_OTHER): Payer: Medicare Other | Admitting: Orthopedic Surgery

## 2014-08-05 VITALS — BP 144/81 | Ht 63.0 in | Wt 133.0 lb

## 2014-08-05 DIAGNOSIS — S61219A Laceration without foreign body of unspecified finger without damage to nail, initial encounter: Secondary | ICD-10-CM

## 2014-08-05 DIAGNOSIS — S61209A Unspecified open wound of unspecified finger without damage to nail, initial encounter: Secondary | ICD-10-CM

## 2014-08-05 NOTE — Patient Instructions (Signed)
KEEP HAND DRY

## 2014-08-06 ENCOUNTER — Encounter: Payer: Self-pay | Admitting: Orthopedic Surgery

## 2014-08-06 NOTE — Progress Notes (Signed)
   Subjective:    Patient ID: Rachel Dodson, female    DOB: [birth year], 68 y.o.   MRN: [id number]  Hand Injury    Chief Complaint  Patient presents with  . Hand Injury    ER follow up, check laceration on left middle and ring finger, DOI 07/30/14  07-29-2014 LEFT HAND MIDDLE AND RING FINGER INJURY AND LACERATIONS PAIN, STIFFNESS, SHARP DULL, THROBBING, STABBING, ACHING   ON KEFLEX  Past Medical History  Diagnosis Date  . Hypertension   . Depression   . Anxiety    Past Surgical History  Procedure Laterality Date  . Abdominal hysterectomy    . Video bronchoscopy N/A 10/23/2013    Procedure: VIDEO BRONCHOSCOPY;  Surgeon: Grace Isaac, MD;  Location: [place];  Service: Thoracic;  Laterality: N/A;  . Video assisted thoracoscopy Left 10/23/2013    Procedure: VIDEO ASSISTED THORACOSCOPY;  Surgeon: Grace Isaac, MD;  Location: [place];  Service: Thoracic;  Laterality: Left;     Review of Systems Review of systems has been recorded reviewed and signed and scanned into the chart     Objective:   Physical Exam BP 144/81  Ht 5\' 3"  (1.6 m)  Wt 133 lb (60.328 kg)  BMI 23.57 kg/m2 General appearance is normal, the patient is alert and oriented x3 with normal mood and affect. NORMAL AMBULATION  LEFT HAND MIDDLE AND RING FINGER : RING FINGER DEGLOVING SKIN FINGER TIP; LONG FINGER VOLAR SKIN FLAP  NEURO VASCULAR INTACT  FLEXION IS INTACT IN BOTH       Assessment & Plan:   LACERATIONS BOTH FINGERS   RE-DRESSED  F/U IN 1 WEEK

## 2014-08-12 ENCOUNTER — Ambulatory Visit (INDEPENDENT_AMBULATORY_CARE_PROVIDER_SITE_OTHER): Payer: Medicare Other | Admitting: Orthopedic Surgery

## 2014-08-12 VITALS — BP 155/88 | Ht 63.0 in | Wt 133.0 lb

## 2014-08-12 DIAGNOSIS — S61209A Unspecified open wound of unspecified finger without damage to nail, initial encounter: Secondary | ICD-10-CM

## 2014-08-12 DIAGNOSIS — S61219A Laceration without foreign body of unspecified finger without damage to nail, initial encounter: Secondary | ICD-10-CM

## 2014-08-12 NOTE — Progress Notes (Signed)
Followup injuries to the left hand  Chief Complaint  Patient presents with  . Wound Check    recheck wounds left middle and ring fingers, DOI 07/30/14   The ring finger has an area of skin loss and nail loss the long finger has a suture which we took out and applied skin seal. Both fingers look good no signs of infection  Redressing continue with dressing change in a week

## 2014-08-18 ENCOUNTER — Ambulatory Visit (INDEPENDENT_AMBULATORY_CARE_PROVIDER_SITE_OTHER): Payer: Medicare Other | Admitting: Orthopedic Surgery

## 2014-08-18 VITALS — BP 163/91 | Ht 63.0 in | Wt 133.0 lb

## 2014-08-18 DIAGNOSIS — S61219A Laceration without foreign body of unspecified finger without damage to nail, initial encounter: Secondary | ICD-10-CM

## 2014-08-18 DIAGNOSIS — S61209A Unspecified open wound of unspecified finger without damage to nail, initial encounter: Secondary | ICD-10-CM

## 2014-08-18 NOTE — Progress Notes (Signed)
Chief Complaint  Patient presents with  . Follow-up    recheck wounds left middle and ring fingers, DOI 07/30/14    Left long and left ring finger wound check and dressing change. The ring finger is cannulating well the long finger has fibrinous exudate which I debrided. Redress and recheck again in a week

## 2014-08-25 ENCOUNTER — Ambulatory Visit (HOSPITAL_COMMUNITY): Payer: Medicare Other

## 2014-08-26 ENCOUNTER — Ambulatory Visit (INDEPENDENT_AMBULATORY_CARE_PROVIDER_SITE_OTHER): Payer: Medicare Other | Admitting: Orthopedic Surgery

## 2014-08-26 VITALS — BP 123/63 | Ht 63.0 in | Wt 133.0 lb

## 2014-08-26 DIAGNOSIS — S61209A Unspecified open wound of unspecified finger without damage to nail, initial encounter: Secondary | ICD-10-CM

## 2014-08-26 DIAGNOSIS — S61219A Laceration without foreign body of unspecified finger without damage to nail, initial encounter: Secondary | ICD-10-CM

## 2014-08-26 NOTE — Progress Notes (Signed)
Chief Complaint  Patient presents with  . Wound Check    recheck wounds left middle and ring fingers, DOI 07/30/14    Dressing change   wound continues to look good and the ring finger the long finger has healed up completely  Followup in a week

## 2014-09-01 ENCOUNTER — Ambulatory Visit (HOSPITAL_COMMUNITY)
Admission: RE | Admit: 2014-09-01 | Discharge: 2014-09-01 | Disposition: A | Discharge status: Home / Self Care | Payer: Medicare Other | Source: Ambulatory Visit | Attending: Internal Medicine | Admitting: Internal Medicine

## 2014-09-01 DIAGNOSIS — Z1231 Encounter for screening mammogram for malignant neoplasm of breast: Secondary | ICD-10-CM | POA: Insufficient documentation

## 2014-09-02 ENCOUNTER — Ambulatory Visit (INDEPENDENT_AMBULATORY_CARE_PROVIDER_SITE_OTHER): Payer: Medicare Other | Admitting: Orthopedic Surgery

## 2014-09-02 VITALS — Ht 63.0 in | Wt 133.0 lb

## 2014-09-02 DIAGNOSIS — S61219A Laceration without foreign body of unspecified finger without damage to nail, initial encounter: Secondary | ICD-10-CM

## 2014-09-02 DIAGNOSIS — S61209A Unspecified open wound of unspecified finger without damage to nail, initial encounter: Secondary | ICD-10-CM

## 2014-09-03 NOTE — Progress Notes (Signed)
Chief Complaint  Patient presents with  . Follow-up    recheck wound left middle finger  And recheck the wound left ring finger  Both wounds are doing well both wounds are closing the left ring finger nail is starting to grow no deformity at this point. No sign of infection  Return in one week dressing change

## 2014-09-13 ENCOUNTER — Ambulatory Visit (INDEPENDENT_AMBULATORY_CARE_PROVIDER_SITE_OTHER): Payer: Medicare Other | Admitting: Orthopedic Surgery

## 2014-09-13 DIAGNOSIS — S61219A Laceration without foreign body of unspecified finger without damage to nail, initial encounter: Secondary | ICD-10-CM

## 2014-09-13 DIAGNOSIS — S61209A Unspecified open wound of unspecified finger without damage to nail, initial encounter: Secondary | ICD-10-CM

## 2014-09-13 NOTE — Progress Notes (Signed)
Follow up dressing change   Chief Complaint   Patient presents with   .  Follow-up       recheck wound left middle finger   And recheck the wound left ring finger  Both look very good the long finger is completely healed the ring finger is healing with no sign of infection  Return 2 weeks. She is advised to soak the finger for 10 minutes twice a day with warm water and discharge.

## 2014-09-14 DIAGNOSIS — S61219A Laceration without foreign body of unspecified finger without damage to nail, initial encounter: Secondary | ICD-10-CM | POA: Insufficient documentation

## 2014-09-14 NOTE — Patient Instructions (Signed)
Band aids on the wounds

## 2014-09-16 ENCOUNTER — Other Ambulatory Visit: Payer: Self-pay | Admitting: *Deleted

## 2014-09-16 DIAGNOSIS — R911 Solitary pulmonary nodule: Secondary | ICD-10-CM

## 2014-09-27 ENCOUNTER — Ambulatory Visit: Payer: Medicare Other | Admitting: Orthopedic Surgery

## 2014-10-04 ENCOUNTER — Ambulatory Visit: Payer: Medicare Other | Admitting: Orthopedic Surgery

## 2014-10-04 ENCOUNTER — Telehealth: Payer: Self-pay | Admitting: Orthopedic Surgery

## 2014-10-04 NOTE — Telephone Encounter (Signed)
Routing to Dr Bill Salinas only

## 2014-10-04 NOTE — Telephone Encounter (Signed)
Patient was scheduled for today for wound care f/u, today was actually 2 wk f/u due to her cancelling last week at her 1 wk f/u and she has called and cx again today.

## 2014-10-07 ENCOUNTER — Ambulatory Visit (INDEPENDENT_AMBULATORY_CARE_PROVIDER_SITE_OTHER): Payer: Medicare Other | Admitting: Cardiothoracic Surgery

## 2014-10-07 ENCOUNTER — Ambulatory Visit
Admission: RE | Admit: 2014-10-07 | Discharge: 2014-10-07 | Disposition: A | Discharge status: Home / Self Care | Payer: Medicare Other | Source: Ambulatory Visit | Attending: Cardiothoracic Surgery | Admitting: Cardiothoracic Surgery

## 2014-10-07 ENCOUNTER — Encounter: Payer: Self-pay | Admitting: Cardiothoracic Surgery

## 2014-10-07 VITALS — BP 120/70 | HR 72 | Resp 16 | Ht 61.5 in | Wt 142.0 lb

## 2014-10-07 DIAGNOSIS — R911 Solitary pulmonary nodule: Secondary | ICD-10-CM

## 2014-10-07 NOTE — Progress Notes (Signed)
[facility] Medical Record #[id number] Date of Birth: 28-Sep-1946  [employer], MD Glo Herring., MD  Chief Complaint:   PostOp Follow Up Visit 10/23/2013  OPERATIVE REPORT  PREOPERATIVE DIAGNOSIS: Complex large left pleural effusion, probable  empyema.  POSTOPERATIVE DIAGNOSIS: Complex large left pleural effusion, probable  empyema.  SURGICAL PROCEDURE: Bronchoscopy, left video-assisted thoracoscopy,  mini thoracotomy, decortication of the left lung and drainage of  empyema.  SURGEON: Lanelle Bal, MD  History of Present Illness:     Patient returns for followup after discharge  by pulmonology service because of respiratory failure, sepsis, right lung mass, left effusion and left empyema in November 2014. At the end of October 2014  she underwent drainage of left pleural effusion/empyema and was continued on antibiotics for extensive lower lobe pneumonia and atelectasis. Cultures were negative at the time of surgery, pathology did not reveal any malignancy in the material removed from the left chest. Preop CT scan of the chest showed a right lower lobe lung nodule.   The patient has remained off of cigarettes   Patient returns to the office today after a followup CT scan of the chest  was performed to evaluate the right lower lobe lung nodule.    History  Smoking status  . Former Smoker -- 0.75 packs/day for 15 years  . Types: Cigarettes  . Quit date: 08/24/2013  Smokeless tobacco  . Never Used        Allergies  Allergen Reactions  . Latex     Current Outpatient Prescriptions  Medication Sig Dispense Refill  . acetaminophen (TYLENOL) 325 MG tablet Take 2 tablets (650 mg total) by mouth every 6 (six) hours as needed for mild pain (or Fever >/= 101).      Marland Kitchen aspirin 81 MG chewable tablet Chew 81 mg by mouth daily.      . clonazePAM (KLONOPIN) 0.5 MG tablet Take 0.5 mg by mouth 2 (two) times daily as needed for anxiety.       .  DULoxetine (CYMBALTA) 60 MG capsule Take 60 mg by mouth at bedtime.       Marland Kitchen losartan (COZAAR) 50 MG tablet Take 50 mg by mouth daily.      Marland Kitchen oxyCODONE (ROXICODONE) 15 MG immediate release tablet Take 15 mg by mouth 4 (four) times daily as needed.      . pantoprazole (PROTONIX) 40 MG tablet Take 40 mg by mouth 2 (two) times daily.       No current facility-administered medications for this visit.       Physical Exam: BP 120/70  Pulse 72  Resp 16  Ht 5' 1.5" (1.562 m)  Wt 142 lb (64.411 kg)  BMI 26.40 kg/m2  SpO2 94%  General appearance: alert and cooperative Neurologic: intact Heart: regular rate and rhythm, S1, S2 normal, no murmur, click, rub or gallop Lungs: diminished breath sounds LLL Abdomen: soft, non-tender; bowel sounds normal; no masses,  no organomegaly Extremities: extremities normal, atraumatic, no cyanosis or edema and Homans sign is negative, no sign of DVT No cervical or supraclavicular adenopathy  Diagnostic Studies & Laboratory data:          Recent Radiology Findings: Ct Chest Wo Contrast  10/07/2014   CLINICAL DATA:  Followup pulmonary nodule.  EXAM: CT CHEST WITHOUT CONTRAST  TECHNIQUE: Multidetector CT imaging of the chest was performed following the standard protocol without IV contrast.  COMPARISON:  Chest CTs 11/16/2013 and 03/24/2014.  PET-CT 12/02/2013  FINDINGS: Chest wall: Stable bilateral breast prosthesis. No supraclavicular or axillary adenopathy. Stable mild right thyroid goiter. The bony thorax is intact. No destructive bone lesions or spinal canal compromise. Exaggerated thoracic kyphosis with moderate degenerative changes in the mid thoracic spine.  Mediastinum: The heart is normal in size. No pericardial effusion. Stable coronary artery calcifications. Stable aortic calcifications. No focal aneurysm. There is a large hiatal hernia with most of the stomach up in the chest.  Lungs: The slightly lobulated 12 mm lesion in the superior segment of the  right lower lobe is unchanged. Neuro new lung regions. No acute pulmonary findings. No interstitial lung disease or bronchiectasis. No pleural effusion.  Upper abdomen:  No significant findings.  IMPRESSION: Stable 12 mm superior segment right lower lobe pulmonary nodules since 10/20/2013. Recommend a followup noncontrast chest CT in 12 months which would be a 2 year followup from the original imaging.  Large hiatal hernia.  Stable coronary artery calcifications.   Electronically Signed   By: Kalman Jewels M.D.   On: 10/07/2014 12:29  Ct Chest Wo Contrast  03/24/2014   CLINICAL DATA:  Chest pain, follow-up lung nodule  EXAM: CT CHEST WITHOUT CONTRAST  TECHNIQUE: Multidetector CT imaging of the chest was performed following the standard protocol without IV contrast.  COMPARISON:  PET-CT dated 12/02/2013, CT chest dated 11/16/2013, and CT chest dated 10/20/2013  FINDINGS: 13 x 12 mm lobulated nodule in the superior segment right lower lobe (series 4/ image 22). Suspected intralesional fat (series 3/image 22). This appearance, coupled with the lack of hypermetabolism on PET, favors a benign hamartoma.  No new/suspicious pulmonary nodules. Trace left pleural scarring/thickening, likely related to prior VATS. No pleural effusion or pneumothorax.  Visualized thyroid is unremarkable.  The heart is normal in size. No pericardial effusion. Coronary atherosclerosis. Atherosclerotic calcifications of the aortic arch.  No suspicious mediastinal or axillary lymphadenopathy.  Bilateral breast augmentation.  Large hiatal hernia with inverted intrathoracic stomach.  Visualized upper abdomen is otherwise unremarkable.  Degenerative changes of the visualized thoracolumbar spine. Exaggerated thoracic kyphosis.  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 Five month stability has been demonstrated.  Follow-up CT chest is suggested in approximately 6  months (9-12 months from initial CT).  This recommendation follows the consensus statement: Guidelines for Management of Small Pulmonary Nodules Detected on CT Scans: A Statement from the [place] as published in Radiology 2005; 237:395-400.   Electronically Signed   By: Julian Hy M.D.   On: 03/24/2014 14:43     Ct Chest W Contrast  11/16/2013   CLINICAL DATA:  Pleural effusion, abnormal chest x-ray, history hypertension, breast implants ; history of prior exam indicates follow-up empyema  EXAM: CT CHEST WITH CONTRAST  TECHNIQUE: Multidetector CT imaging of the chest was performed during intravenous contrast administration. Sagittal and coronal MPR images reconstructed from axial data set.  CONTRAST:  23mL OMNIPAQUE IOHEXOL 300 MG/ML  SOLN  COMPARISON:  10/30/2013  Correlation:  Chest radiograph 11/12/2013  FINDINGS: Atherosclerotic calcifications aorta without aneurysm or dissection on nondedicated exam.  Mitral annular calcification present.  Large hiatal hernia.  Visualized portion of upper abdomen unremarkable.  Bilateral breast prostheses, demonstrating capsular calcification greater on right.  No thoracic adenopathy.  Partially loculated pleural effusion with pleural thickening at the inferior left hemi thorax, decreased in size since previous exam, by history, patient underwent VATS on 10/23/2013 and per history of  prior CT had an empyema.  Associated atelectasis in left lower lobe.  Lobulated 13 x 12 x 11 mm nodule superior segment right lower lobe, previously 13 x 12 x 10 mm.  Mild compressive atelectasis of right lower lobe by large hiatal hernia.  No additional pulmonary mass or nodule.  No pneumothorax.  Fracture lateral left 6th rib image 31.  Bones appear demineralized with scattered degenerative disc disease changes and a mild kyphosis of the mid thoracic spine.  IMPRESSION: Decrease in loculated pleural effusion in atelectasis at left base since previous exam ; these changes  likely reflect sequela of prior VATS and thoracostomy tubes as well as empyema.  Slightly improved aeration at left lung base since previous exam.  Stable size of 13 x 12 x 11 mm diameter lobulated soft tissue nodule in the superior segment of the left lower lobe.   Electronically Signed   By: Lavonia Dana M.D.   On: 11/16/2013 17:03      Recent Labs: Lab Results  Component Value Date   WBC 5.2 01/29/2014   HGB 11.4* 01/29/2014   HCT 32.8* 01/29/2014   PLT 151 01/29/2014   GLUCOSE 108* 01/30/2014   ALT 31 01/26/2014   AST 108* 01/26/2014   NA 137 01/30/2014   K 3.0* 01/30/2014   CL 95* 01/30/2014   CREATININE 1.72* 01/30/2014   BUN 23 01/30/2014   CO2 31 01/30/2014   INR 0.97 01/25/2014      Assessment / Plan:    #1 cleared left pleural effusion on chest x-ray, without active evidence of infection #2 right lower lobe lung nodule appears to have decreased in size from 1.4  To 1 cm. The nodule is not hypermetabolic and unchanged in size over the past 5 months.      Stable 12 mm superior segment right lower lobe pulmonary nodules since 10/20/2013. Recommend a followup noncontrast chest CT in 12 months which would be a 2 year     followup from the original imaging.   Repeat CT scan of the chest will be done in 12  Months.   Rachel Dodson B 10/07/2014 12:45 PM

## 2014-10-12 ENCOUNTER — Ambulatory Visit: Payer: Medicare Other | Admitting: Orthopedic Surgery

## 2014-10-18 ENCOUNTER — Telehealth: Payer: Self-pay | Admitting: *Deleted

## 2014-10-18 NOTE — Telephone Encounter (Signed)
Opened in error

## 2015-03-01 DIAGNOSIS — H25819 Combined forms of age-related cataract, unspecified eye: Secondary | ICD-10-CM | POA: Diagnosis not present

## 2015-03-01 DIAGNOSIS — H52223 Regular astigmatism, bilateral: Secondary | ICD-10-CM | POA: Diagnosis not present

## 2015-03-01 DIAGNOSIS — H5203 Hypermetropia, bilateral: Secondary | ICD-10-CM | POA: Diagnosis not present

## 2015-03-01 DIAGNOSIS — H524 Presbyopia: Secondary | ICD-10-CM | POA: Diagnosis not present

## 2015-03-03 DIAGNOSIS — I10 Essential (primary) hypertension: Secondary | ICD-10-CM | POA: Diagnosis not present

## 2015-03-03 DIAGNOSIS — E871 Hypo-osmolality and hyponatremia: Secondary | ICD-10-CM | POA: Diagnosis not present

## 2015-03-03 DIAGNOSIS — K219 Gastro-esophageal reflux disease without esophagitis: Secondary | ICD-10-CM | POA: Diagnosis not present

## 2015-03-03 DIAGNOSIS — G894 Chronic pain syndrome: Secondary | ICD-10-CM | POA: Diagnosis not present

## 2015-03-03 DIAGNOSIS — Z6824 Body mass index (BMI) 24.0-24.9, adult: Secondary | ICD-10-CM | POA: Diagnosis not present

## 2015-03-03 DIAGNOSIS — Z23 Encounter for immunization: Secondary | ICD-10-CM | POA: Diagnosis not present

## 2015-03-03 DIAGNOSIS — E782 Mixed hyperlipidemia: Secondary | ICD-10-CM | POA: Diagnosis not present

## 2015-08-10 DIAGNOSIS — K219 Gastro-esophageal reflux disease without esophagitis: Secondary | ICD-10-CM | POA: Diagnosis not present

## 2015-08-10 DIAGNOSIS — Z6823 Body mass index (BMI) 23.0-23.9, adult: Secondary | ICD-10-CM | POA: Diagnosis not present

## 2015-08-10 DIAGNOSIS — J449 Chronic obstructive pulmonary disease, unspecified: Secondary | ICD-10-CM | POA: Diagnosis not present

## 2015-08-10 DIAGNOSIS — G894 Chronic pain syndrome: Secondary | ICD-10-CM | POA: Diagnosis not present

## 2015-08-10 DIAGNOSIS — I10 Essential (primary) hypertension: Secondary | ICD-10-CM | POA: Diagnosis not present

## 2015-09-06 DIAGNOSIS — R69 Illness, unspecified: Secondary | ICD-10-CM | POA: Diagnosis not present

## 2015-10-06 ENCOUNTER — Other Ambulatory Visit: Payer: Self-pay | Admitting: Cardiothoracic Surgery

## 2015-10-06 DIAGNOSIS — R911 Solitary pulmonary nodule: Secondary | ICD-10-CM

## 2015-10-18 DIAGNOSIS — I10 Essential (primary) hypertension: Secondary | ICD-10-CM | POA: Diagnosis not present

## 2015-10-18 DIAGNOSIS — L570 Actinic keratosis: Secondary | ICD-10-CM | POA: Diagnosis not present

## 2015-10-18 DIAGNOSIS — Z6824 Body mass index (BMI) 24.0-24.9, adult: Secondary | ICD-10-CM | POA: Diagnosis not present

## 2015-10-18 DIAGNOSIS — J449 Chronic obstructive pulmonary disease, unspecified: Secondary | ICD-10-CM | POA: Diagnosis not present

## 2015-10-18 DIAGNOSIS — Z1389 Encounter for screening for other disorder: Secondary | ICD-10-CM | POA: Diagnosis not present

## 2015-10-18 DIAGNOSIS — E781 Pure hyperglyceridemia: Secondary | ICD-10-CM | POA: Diagnosis not present

## 2015-10-27 ENCOUNTER — Ambulatory Visit (HOSPITAL_COMMUNITY): Payer: Medicare Other

## 2015-10-27 ENCOUNTER — Ambulatory Visit: Payer: Medicare Other | Admitting: Cardiothoracic Surgery

## 2015-11-24 ENCOUNTER — Ambulatory Visit (HOSPITAL_COMMUNITY)
Admission: RE | Admit: 2015-11-24 | Discharge: 2015-11-24 | Disposition: A | Discharge status: Home / Self Care | Payer: Medicare Other | Source: Ambulatory Visit | Attending: Cardiothoracic Surgery | Admitting: Cardiothoracic Surgery

## 2015-11-24 ENCOUNTER — Ambulatory Visit (INDEPENDENT_AMBULATORY_CARE_PROVIDER_SITE_OTHER): Payer: Medicare Other | Admitting: Cardiothoracic Surgery

## 2015-11-24 ENCOUNTER — Encounter: Payer: Self-pay | Admitting: Cardiothoracic Surgery

## 2015-11-24 VITALS — BP 105/68 | HR 62 | Resp 16 | Ht 61.5 in | Wt 138.2 lb

## 2015-11-24 DIAGNOSIS — I2510 Atherosclerotic heart disease of native coronary artery without angina pectoris: Secondary | ICD-10-CM | POA: Insufficient documentation

## 2015-11-24 DIAGNOSIS — D381 Neoplasm of uncertain behavior of trachea, bronchus and lung: Secondary | ICD-10-CM

## 2015-11-24 DIAGNOSIS — K449 Diaphragmatic hernia without obstruction or gangrene: Secondary | ICD-10-CM | POA: Insufficient documentation

## 2015-11-24 DIAGNOSIS — R911 Solitary pulmonary nodule: Secondary | ICD-10-CM | POA: Insufficient documentation

## 2015-11-24 NOTE — Progress Notes (Signed)
[facility] Medical Record [id number] Date of Birth: December 04, 1946  [employer], MD Glo Herring., MD  Chief Complaint:   PostOp Follow Up Visit 10/23/2013  OPERATIVE REPORT  PREOPERATIVE DIAGNOSIS: Complex large left pleural effusion, probable  empyema.  POSTOPERATIVE DIAGNOSIS: Complex large left pleural effusion, probable  empyema.  SURGICAL PROCEDURE: Bronchoscopy, left video-assisted thoracoscopy,  mini thoracotomy, decortication of the left lung and drainage of  empyema.  SURGEON: Lanelle Bal, MD  History of Present Illness:     Patient returns for followup after discharge  by pulmonology service because of respiratory failure, sepsis, right lung mass, left effusion and left empyema in November 2014. At the end of October 2014  she underwent drainage of left pleural effusion/empyema and was continued on antibiotics for extensive lower lobe pneumonia and atelectasis. Cultures were negative at the time of surgery, pathology did not reveal any malignancy in the material removed from the left chest. Preop CT scan of the chest showed a right lower lobe lung nodule.   The patient has remained off of cigarettes   Patient returns to the office today after a followup CT scan of the chest  was performed to evaluate the right lower lobe lung nodule.    History  Smoking status  . Former Smoker -- 0.75 packs/day for 15 years  . Types: Cigarettes  . Quit date: 08/24/2013  Smokeless tobacco  . Never Used        Allergies  Allergen Reactions  . Latex     Current Outpatient Prescriptions  Medication Sig Dispense Refill  . acetaminophen (TYLENOL) 325 MG tablet Take 2 tablets (650 mg total) by mouth every 6 (six) hours as needed for mild pain (or Fever >/= 101).    Marland Kitchen ALPRAZolam (XANAX) 1 MG tablet Take 1 mg by mouth 2 (two) times daily as needed for anxiety.    Marland Kitchen amitriptyline (ELAVIL) 50 MG tablet Take 50 mg by mouth at bedtime.    Marland Kitchen aspirin  81 MG chewable tablet Chew 81 mg by mouth daily.    . bisoprolol-hydrochlorothiazide (ZIAC) 5-6.25 MG tablet Take 1 tablet by mouth 2 (two) times daily.    . Calcium Carbonate (CALCIUM 600 HIGH POTENCY PO) Take 1 tablet by mouth 2 (two) times daily.    . Ferrous Sulfate (IRON) 325 (65 FE) MG TABS Take 1 tablet by mouth daily.    Marland Kitchen FLUoxetine (PROZAC) 20 MG tablet Take 20 mg by mouth daily.    Marland Kitchen levothyroxine (SYNTHROID, LEVOTHROID) 25 MCG tablet Take 25 mcg by mouth daily before breakfast.    . losartan (COZAAR) 50 MG tablet Take 50 mg by mouth daily.    . Omega-3 Fatty Acids (FISH OIL) 1000 MG CAPS Take 2 capsules by mouth daily.    Marland Kitchen oxyCODONE (ROXICODONE) 15 MG immediate release tablet Take 15 mg by mouth 4 (four) times daily as needed.    . pantoprazole (PROTONIX) 40 MG tablet Take 40 mg by mouth 2 (two) times daily.     No current facility-administered medications for this visit.       Physical Exam: BP 105/68 mmHg  Pulse 62  Resp 16  Ht 5' 1.5" (1.562 m)  Wt 138 lb 3.2 oz (62.687 kg)  BMI 25.69 kg/m2  SpO2 94%  General appearance: alert and cooperative Neurologic: intact Heart: regular rate and rhythm, S1, S2 normal, no murmur, click, rub or gallop Lungs: diminished breath sounds LLL Abdomen: soft, non-tender;  bowel sounds normal; no masses,  no organomegaly Extremities: extremities normal, atraumatic, no cyanosis or edema and Homans sign is negative, no sign of DVT No cervical or supraclavicular adenopathy  Diagnostic Studies & Laboratory data:          Recent Radiology Findings: Ct Chest Wo Contrast  11/24/2015  CLINICAL DATA:  Follow-up right lower lobe pulmonary nodule. EXAM: CT CHEST WITHOUT CONTRAST TECHNIQUE: Multidetector CT imaging of the chest was performed following the standard protocol without IV contrast. COMPARISON:  10/07/2014 chest CT. FINDINGS: Mediastinum/Nodes: Normal heart size. Stable trace pericardial fluid/ thickening. Left anterior descending,  left circumflex and right coronary atherosclerosis. Stable prominent mitral annular calcification. Great vessels are normal in course and caliber. Stable hypodense 0.7 cm right thyroid lobe nodule. Normal esophagus. No pathologically enlarged axillary, mediastinal or gross hilar lymph nodes, noting limited sensitivity for the detection of hilar adenopathy on this noncontrast study. Lungs/Pleura: No pneumothorax. No pleural effusion. There is a solid 1.4 x 1.4 cm pulmonary nodule in the superior segment right lower lobe (series 3/image 20), previously 1.2 x 1.2 cm on 10/07/2014, mildly increased in size. No acute consolidative airspace disease or new significant pulmonary nodules. There is stable mild passive atelectasis in the right greater than left lower lobes from the large hiatal hernia. Upper abdomen: Large hiatal hernia, unchanged. Musculoskeletal: No aggressive appearing focal osseous lesions. Moderate degenerative changes in the thoracic spine. There are stable intact appearing bilateral breast prostheses. IMPRESSION: 1. Mild interval growth of the superior segment right lower lobe pulmonary nodule, now 1.4 x 1.4 cm. The slow growth and very low level metabolic activity on the [id number] PET-CT study are most suggestive of a benign etiology such as a pulmonary hamartoma, however an indolent neoplasm such as carcinoid or adenocarcinoma cannot be entirely excluded. Further management, including any decision to pursue tissue sampling, as clinically warranted. 2. No new significant pulmonary nodules.  No thoracic adenopathy. 3. Stable large hiatal hernia with mild passive atelectasis in both lower lobes. 4. Three-vessel coronary atherosclerosis. Electronically Signed   By: Ilona Sorrel M.D.   On: 11/24/2015 13:59   Ct Chest Wo Contrast  10/07/2014   CLINICAL DATA:  Followup pulmonary nodule.  EXAM: CT CHEST WITHOUT CONTRAST  TECHNIQUE: Multidetector CT imaging of the chest was performed following the  standard protocol without IV contrast.  COMPARISON:  Chest CTs 11/16/2013 and 03/24/2014.  PET-CT 12/02/2013  FINDINGS: Chest wall: Stable bilateral breast prosthesis. No supraclavicular or axillary adenopathy. Stable mild right thyroid goiter. The bony thorax is intact. No destructive bone lesions or spinal canal compromise. Exaggerated thoracic kyphosis with moderate degenerative changes in the mid thoracic spine.  Mediastinum: The heart is normal in size. No pericardial effusion. Stable coronary artery calcifications. Stable aortic calcifications. No focal aneurysm. There is a large hiatal hernia with most of the stomach up in the chest.  Lungs: The slightly lobulated 12 mm lesion in the superior segment of the right lower lobe is unchanged. Neuro new lung regions. No acute pulmonary findings. No interstitial lung disease or bronchiectasis. No pleural effusion.  Upper abdomen:  No significant findings.  IMPRESSION: Stable 12 mm superior segment right lower lobe pulmonary nodules since 10/20/2013. Recommend a followup noncontrast chest CT in 12 months which would be a 2 year followup from the original imaging.  Large hiatal hernia.  Stable coronary artery calcifications.   Electronically Signed   By: Kalman Jewels M.D.   On: 10/07/2014 12:29  Ct Chest Wo Contrast  03/24/2014  CLINICAL DATA:  Chest pain, follow-up lung nodule  EXAM: CT CHEST WITHOUT CONTRAST  TECHNIQUE: Multidetector CT imaging of the chest was performed following the standard protocol without IV contrast.  COMPARISON:  PET-CT dated 12/02/2013, CT chest dated 11/16/2013, and CT chest dated 10/20/2013  FINDINGS: 13 x 12 mm lobulated nodule in the superior segment right lower lobe (series 4/ image 22). Suspected intralesional fat (series 3/image 22). This appearance, coupled with the lack of hypermetabolism on PET, favors a benign hamartoma.  No new/suspicious pulmonary nodules. Trace left pleural scarring/thickening, likely related to prior  VATS. No pleural effusion or pneumothorax.  Visualized thyroid is unremarkable.  The heart is normal in size. No pericardial effusion. Coronary atherosclerosis. Atherosclerotic calcifications of the aortic arch.  No suspicious mediastinal or axillary lymphadenopathy.  Bilateral breast augmentation.  Large hiatal hernia with inverted intrathoracic stomach.  Visualized upper abdomen is otherwise unremarkable.  Degenerative changes of the visualized thoracolumbar spine. Exaggerated thoracic kyphosis.  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 Five month stability has been demonstrated.  Follow-up CT chest is suggested in approximately 6 months (9-12 months from initial CT).  This recommendation follows the consensus statement: Guidelines for Management of Small Pulmonary Nodules Detected on CT Scans: A Statement from the [place] as published in Radiology 2005; 237:395-400.   Electronically Signed   By: Julian Hy M.D.   On: 03/24/2014 14:43     Ct Chest W Contrast  11/16/2013   CLINICAL DATA:  Pleural effusion, abnormal chest x-ray, history hypertension, breast implants ; history of prior exam indicates follow-up empyema  EXAM: CT CHEST WITH CONTRAST  TECHNIQUE: Multidetector CT imaging of the chest was performed during intravenous contrast administration. Sagittal and coronal MPR images reconstructed from axial data set.  CONTRAST:  74mL OMNIPAQUE IOHEXOL 300 MG/ML  SOLN  COMPARISON:  10/30/2013  Correlation:  Chest radiograph 11/12/2013  FINDINGS: Atherosclerotic calcifications aorta without aneurysm or dissection on nondedicated exam.  Mitral annular calcification present.  Large hiatal hernia.  Visualized portion of upper abdomen unremarkable.  Bilateral breast prostheses, demonstrating capsular calcification greater on right.  No thoracic adenopathy.  Partially loculated pleural effusion with pleural  thickening at the inferior left hemi thorax, decreased in size since previous exam, by history, patient underwent VATS on 10/23/2013 and per history of prior CT had an empyema.  Associated atelectasis in left lower lobe.  Lobulated 13 x 12 x 11 mm nodule superior segment right lower lobe, previously 13 x 12 x 10 mm.  Mild compressive atelectasis of right lower lobe by large hiatal hernia.  No additional pulmonary mass or nodule.  No pneumothorax.  Fracture lateral left 6th rib image 31.  Bones appear demineralized with scattered degenerative disc disease changes and a mild kyphosis of the mid thoracic spine.  IMPRESSION: Decrease in loculated pleural effusion in atelectasis at left base since previous exam ; these changes likely reflect sequela of prior VATS and thoracostomy tubes as well as empyema.  Slightly improved aeration at left lung base since previous exam.  Stable size of 13 x 12 x 11 mm diameter lobulated soft tissue nodule in the superior segment of the left lower lobe.   Electronically Signed   By: Lavonia Dana M.D.   On: 11/16/2013 17:03      Recent Labs: Lab Results  Component Value Date   WBC 5.2 01/29/2014   HGB 11.4* 01/29/2014   HCT 32.8* 01/29/2014  PLT 151 01/29/2014   GLUCOSE 108* 01/30/2014   ALT 31 01/26/2014   AST 108* 01/26/2014   NA 137 01/30/2014   K 3.0* 01/30/2014   CL 95* 01/30/2014   CREATININE 1.72* 01/30/2014   BUN 23 01/30/2014   CO2 31 01/30/2014   INR 0.97 01/25/2014      Assessment / Plan:    #1 cleared left pleural effusion on chest x-ray, without active evidence of infection #2 right lower lobe lung nodule appears remain stable 1.4. The nodule is not hypermetabolic and unchanged in size over the past  24 months.  Stable 12 mm superior segment right lower lobe pulmonary nodules since 10/20/2013. Recommend a followup noncontrast chest CT in 12 months which would be a 3year     followup from the original imaging.   Repeat CT scan of the chest will be  done in 12  Months.   Grace Isaac 11/24/2015 4:13 PM

## 2016-01-31 DIAGNOSIS — Z6823 Body mass index (BMI) 23.0-23.9, adult: Secondary | ICD-10-CM | POA: Diagnosis not present

## 2016-01-31 DIAGNOSIS — K219 Gastro-esophageal reflux disease without esophagitis: Secondary | ICD-10-CM | POA: Diagnosis not present

## 2016-01-31 DIAGNOSIS — Z0000 Encounter for general adult medical examination without abnormal findings: Secondary | ICD-10-CM | POA: Diagnosis not present

## 2016-01-31 DIAGNOSIS — J449 Chronic obstructive pulmonary disease, unspecified: Secondary | ICD-10-CM | POA: Diagnosis not present

## 2016-01-31 DIAGNOSIS — F1120 Opioid dependence, uncomplicated: Secondary | ICD-10-CM | POA: Diagnosis not present

## 2016-01-31 DIAGNOSIS — Z1389 Encounter for screening for other disorder: Secondary | ICD-10-CM | POA: Diagnosis not present

## 2016-01-31 DIAGNOSIS — I10 Essential (primary) hypertension: Secondary | ICD-10-CM | POA: Diagnosis not present

## 2016-02-06 ENCOUNTER — Other Ambulatory Visit (HOSPITAL_COMMUNITY): Payer: Self-pay | Admitting: Internal Medicine

## 2016-02-06 DIAGNOSIS — Z1231 Encounter for screening mammogram for malignant neoplasm of breast: Secondary | ICD-10-CM

## 2016-02-16 ENCOUNTER — Other Ambulatory Visit (HOSPITAL_COMMUNITY): Payer: Self-pay | Admitting: Internal Medicine

## 2016-02-16 ENCOUNTER — Ambulatory Visit (HOSPITAL_COMMUNITY): Payer: Self-pay

## 2016-02-16 DIAGNOSIS — Z1231 Encounter for screening mammogram for malignant neoplasm of breast: Secondary | ICD-10-CM

## 2016-02-22 ENCOUNTER — Ambulatory Visit: Payer: Self-pay

## 2016-02-22 ENCOUNTER — Ambulatory Visit (HOSPITAL_COMMUNITY): Payer: Self-pay

## 2016-06-05 DIAGNOSIS — Z1389 Encounter for screening for other disorder: Secondary | ICD-10-CM | POA: Diagnosis not present

## 2016-06-05 DIAGNOSIS — F1120 Opioid dependence, uncomplicated: Secondary | ICD-10-CM | POA: Diagnosis not present

## 2016-06-05 DIAGNOSIS — Z6823 Body mass index (BMI) 23.0-23.9, adult: Secondary | ICD-10-CM | POA: Diagnosis not present

## 2016-06-05 DIAGNOSIS — G894 Chronic pain syndrome: Secondary | ICD-10-CM | POA: Diagnosis not present

## 2016-06-05 DIAGNOSIS — I10 Essential (primary) hypertension: Secondary | ICD-10-CM | POA: Diagnosis not present

## 2016-08-01 DIAGNOSIS — R2689 Other abnormalities of gait and mobility: Secondary | ICD-10-CM | POA: Diagnosis not present

## 2016-10-11 DIAGNOSIS — Z1389 Encounter for screening for other disorder: Secondary | ICD-10-CM | POA: Diagnosis not present

## 2016-10-11 DIAGNOSIS — Z23 Encounter for immunization: Secondary | ICD-10-CM | POA: Diagnosis not present

## 2016-10-11 DIAGNOSIS — J449 Chronic obstructive pulmonary disease, unspecified: Secondary | ICD-10-CM | POA: Diagnosis not present

## 2016-10-11 DIAGNOSIS — I10 Essential (primary) hypertension: Secondary | ICD-10-CM | POA: Diagnosis not present

## 2016-10-11 DIAGNOSIS — E782 Mixed hyperlipidemia: Secondary | ICD-10-CM | POA: Diagnosis not present

## 2016-10-11 DIAGNOSIS — K219 Gastro-esophageal reflux disease without esophagitis: Secondary | ICD-10-CM | POA: Diagnosis not present

## 2016-10-13 ENCOUNTER — Emergency Department (HOSPITAL_COMMUNITY): Payer: Medicare Other

## 2016-10-13 ENCOUNTER — Encounter (HOSPITAL_COMMUNITY): Payer: Self-pay | Admitting: *Deleted

## 2016-10-13 ENCOUNTER — Emergency Department (HOSPITAL_COMMUNITY)
Admission: EM | Admit: 2016-10-13 | Discharge: 2016-10-13 | Disposition: A | Discharge status: Home / Self Care | Payer: Medicare Other | Attending: Emergency Medicine | Admitting: Emergency Medicine

## 2016-10-13 DIAGNOSIS — W010XXA Fall on same level from slipping, tripping and stumbling without subsequent striking against object, initial encounter: Secondary | ICD-10-CM | POA: Diagnosis not present

## 2016-10-13 DIAGNOSIS — Z87891 Personal history of nicotine dependence: Secondary | ICD-10-CM | POA: Insufficient documentation

## 2016-10-13 DIAGNOSIS — R42 Dizziness and giddiness: Secondary | ICD-10-CM | POA: Diagnosis not present

## 2016-10-13 DIAGNOSIS — S42201A Unspecified fracture of upper end of right humerus, initial encounter for closed fracture: Secondary | ICD-10-CM | POA: Diagnosis not present

## 2016-10-13 DIAGNOSIS — W19XXXA Unspecified fall, initial encounter: Secondary | ICD-10-CM

## 2016-10-13 DIAGNOSIS — Y999 Unspecified external cause status: Secondary | ICD-10-CM | POA: Diagnosis not present

## 2016-10-13 DIAGNOSIS — I10 Essential (primary) hypertension: Secondary | ICD-10-CM | POA: Insufficient documentation

## 2016-10-13 DIAGNOSIS — R404 Transient alteration of awareness: Secondary | ICD-10-CM | POA: Diagnosis not present

## 2016-10-13 DIAGNOSIS — Z7982 Long term (current) use of aspirin: Secondary | ICD-10-CM | POA: Insufficient documentation

## 2016-10-13 DIAGNOSIS — Z79899 Other long term (current) drug therapy: Secondary | ICD-10-CM | POA: Insufficient documentation

## 2016-10-13 DIAGNOSIS — S42291A Other displaced fracture of upper end of right humerus, initial encounter for closed fracture: Secondary | ICD-10-CM | POA: Diagnosis not present

## 2016-10-13 DIAGNOSIS — Y92009 Unspecified place in unspecified non-institutional (private) residence as the place of occurrence of the external cause: Secondary | ICD-10-CM | POA: Insufficient documentation

## 2016-10-13 DIAGNOSIS — S4991XA Unspecified injury of right shoulder and upper arm, initial encounter: Secondary | ICD-10-CM | POA: Diagnosis present

## 2016-10-13 DIAGNOSIS — Y93E2 Activity, laundry: Secondary | ICD-10-CM | POA: Insufficient documentation

## 2016-10-13 MED ORDER — HYDROCODONE-ACETAMINOPHEN 5-325 MG PO TABS
1.0000 | ORAL_TABLET | Freq: Once | ORAL | Status: AC
  Administered 2016-10-13: 1 via ORAL
  Filled 2016-10-13: qty 1

## 2016-10-13 MED ORDER — HYDROCODONE-ACETAMINOPHEN 5-325 MG PO TABS: 1.0000 | ORAL_TABLET | Freq: Four times a day (QID) | ORAL | 0 refills | Status: DC | PRN

## 2016-10-13 NOTE — ED Notes (Signed)
Patient transported to X-ray 

## 2016-10-13 NOTE — ED Triage Notes (Signed)
C/o fall at home after tripping up on a step.  Denies hitting her head.

## 2016-10-13 NOTE — ED Provider Notes (Signed)
[facility] DEPT Provider Note   CSN: [id number] Arrival date & time: 10/13/16  1809     History   Chief Complaint Chief Complaint  Patient presents with  . Fall    HPI Rachel Dodson is a 70 y.o. female.  HPI Patient presents with arm pain following a fall. Patient recalls the entirety of the event. She states that she slipped, while doing laundry. Patient fell onto her outstretched right arm. Since that time there's been pain in the proximal right arm. No head trauma, loss of consciousness, no confusion, no disorientation, no neck pain, vision changes, weakness or numbness in any extremity. Pain has been severe since the fall, with no medication taken for pain relief.  Past Medical History:  Diagnosis Date  . Anxiety   . Depression   . Hypertension     Patient Active Problem List   Diagnosis Date Noted  . Laceration of fingers without complication 123456  . Hypokalemia 01/28/2014  . ARF (acute renal failure) ([place]) 01/25/2014  . Rhabdomyolysis 01/25/2014  . Dehydration 01/25/2014  . Fall 01/25/2014  . Dementia 01/25/2014  . COPD  GOLD II 01/08/2014  . Hyponatremia 11/10/2013  . Hypotension, unspecified 10/23/2013  . Leukocytosis, unspecified 10/23/2013  . Acute respiratory failure ([place]) 10/23/2013  . Lung nodule 10/20/2013  . UTI (urinary tract infection) 10/20/2013    Past Surgical History:  Procedure Laterality Date  . ABDOMINAL HYSTERECTOMY    . VIDEO ASSISTED THORACOSCOPY Left 10/23/2013   Procedure: VIDEO ASSISTED THORACOSCOPY;  Surgeon: Grace Isaac, MD;  Location: [place];  Service: Thoracic;  Laterality: Left;  Marland Kitchen VIDEO BRONCHOSCOPY N/A 10/23/2013   Procedure: VIDEO BRONCHOSCOPY;  Surgeon: Grace Isaac, MD;  Location: [facility] OR;  Service: Thoracic;  Laterality: N/A;    OB History    No data available       Home Medications    Prior to Admission medications   Medication Sig Start Date End Date Taking? Authorizing Provider    ALPRAZolam Duanne Moron) 1 MG tablet Take 1 mg by mouth 2 (two) times daily as needed for anxiety.    Historical Provider, MD  amitriptyline (ELAVIL) 50 MG tablet Take 50 mg by mouth at bedtime.    Historical Provider, MD  aspirin 81 MG chewable tablet Chew 81 mg by mouth daily.    Historical Provider, MD  bisoprolol-hydrochlorothiazide (ZIAC) 5-6.25 MG tablet Take 1 tablet by mouth 2 (two) times daily.    Historical Provider, MD  Calcium Carbonate (CALCIUM 600 HIGH POTENCY PO) Take 1 tablet by mouth 2 (two) times daily.    Historical Provider, MD  Ferrous Sulfate (IRON) 325 (65 FE) MG TABS Take 1 tablet by mouth daily.    Historical Provider, MD  FLUoxetine (PROZAC) 20 MG tablet Take 20 mg by mouth daily.    Historical Provider, MD  HYDROcodone-acetaminophen (NORCO/VICODIN) 5-325 MG tablet Take 1 tablet by mouth every 6 (six) hours as needed for severe pain. 10/13/16   Carmin Muskrat, MD  levothyroxine (SYNTHROID, LEVOTHROID) 25 MCG tablet Take 25 mcg by mouth daily before breakfast.    Historical Provider, MD  losartan (COZAAR) 50 MG tablet Take 50 mg by mouth daily. 09/29/13   Historical Provider, MD  Omega-3 Fatty Acids (FISH OIL) 1000 MG CAPS Take 2 capsules by mouth daily.    Historical Provider, MD  pantoprazole (PROTONIX) 40 MG tablet Take 40 mg by mouth 2 (two) times daily. 06/07/14   Historical Provider, MD    Family History History reviewed.  No pertinent family history.  Social History Social History  Substance Use Topics  . Smoking status: Former Smoker    Packs/day: 0.75    Years: 15.00    Types: Cigarettes    Quit date: 08/24/2013  . Smokeless tobacco: Never Used  . Alcohol use No     Allergies   Latex   Review of Systems Review of Systems  Constitutional:       Per HPI, otherwise negative  HENT:       Per HPI, otherwise negative  Respiratory:       Per HPI, otherwise negative  Cardiovascular:       Per HPI, otherwise negative  Gastrointestinal: Negative for  vomiting.  Endocrine:       Negative aside from HPI  Genitourinary:       Neg aside from HPI   Musculoskeletal:       Per HPI, otherwise negative  Skin: Negative for wound.  Allergic/Immunologic: Negative for immunocompromised state.  Neurological: Negative for syncope and weakness.     Physical Exam Updated Vital Signs BP 164/73 (BP Location: Left Arm)   Pulse 77   Temp 98.1 F (36.7 C) (Oral)   Resp 16   Ht 5\' 4"  (1.626 m)   Wt 130 lb (59 kg)   SpO2 100%   BMI 22.31 kg/m   Physical Exam  Constitutional: She is oriented to person, place, and time. She appears well-developed and well-nourished. No distress.  HENT:  Head: Normocephalic and atraumatic.  Eyes: Conjunctivae and EOM are normal.  Neck: No muscular tenderness present. No neck rigidity. Normal range of motion present.  Cardiovascular: Normal rate and regular rhythm.   Pulmonary/Chest: Effort normal and breath sounds normal. No stridor. No respiratory distress.  Abdominal: She exhibits no distension.  Musculoskeletal: She exhibits no edema.       Right shoulder: She exhibits decreased range of motion, tenderness, bony tenderness and swelling.       Left shoulder: Normal.       Right elbow: Normal.      Right wrist: Normal.  Neurological: She is alert and oriented to person, place, and time. No cranial nerve deficit.  Skin: Skin is warm and dry.  Psychiatric: She has a normal mood and affect.  Nursing note and vitals reviewed.    ED Treatments / Results   Radiology Dg Shoulder Right  Result Date: 10/13/2016 CLINICAL DATA:  Fall today on enclosed porch, right shoulder pain EXAM: RIGHT SHOULDER - 2+ VIEW COMPARISON:  None. FINDINGS: There is an acute fracture of the right humeral neck, with impaction. The humeral head is slightly inferiorly subluxed relative to the glenoid fossa. The scapula appears intact. Suspect right pleural effusion and possible right basilar lung opacity. IMPRESSION: 1. Right humeral  neck fracture. 2. Slight inferior subluxation of the humeral head. 3. Right lung opacity warranting further evaluation with chest x-ray. Electronically Signed   By: Nolon Nations M.D.   On: 10/13/2016 18:44    Procedures Procedures (including critical care time) Patient received sling immobilization. This was well tolerated. I discussed patient's case with our orthopedic surgeon on call, who graciously agreed to see the patient in the coming days for further evaluation and management.  Medications Ordered in ED Medications  HYDROcodone-acetaminophen (NORCO/VICODIN) 5-325 MG per tablet 1 tablet (1 tablet Oral Given 10/13/16 1907)     Initial Impression / Assessment and Plan / ED Course  I have reviewed the triage vital signs and the nursing notes.  Pertinent labs & imaging results that were available during my care of the patient were reviewed by me and considered in my medical decision making (see chart for details).  Clinical Course    Female presents after a mechanical fall with pain in the right arm. Patient is awake, alert, but has notable pain in the proximal right upper arm. Patient's evaluation here concerning for humerus fracture, with subluxation. Patient received successful sling immobilization, and after discussion with orthopedics, and without any other red flags during her evaluation, the patient was discharged in stable condition.  Final Clinical Impressions(s) / ED Diagnoses   Final diagnoses:  Fall, initial encounter  Humerus head fracture, right, closed, initial encounter    New Prescriptions New Prescriptions   HYDROCODONE-ACETAMINOPHEN (NORCO/VICODIN) 5-325 MG TABLET    Take 1 tablet by mouth every 6 (six) hours as needed for severe pain.     Carmin Muskrat, MD 10/13/16 [phone]

## 2016-10-13 NOTE — ED Notes (Signed)
Returned from xray

## 2016-10-15 ENCOUNTER — Ambulatory Visit (INDEPENDENT_AMBULATORY_CARE_PROVIDER_SITE_OTHER): Payer: Medicare Other | Admitting: Orthopedic Surgery

## 2016-10-15 ENCOUNTER — Encounter: Payer: Self-pay | Admitting: Orthopedic Surgery

## 2016-10-15 DIAGNOSIS — S42201A Unspecified fracture of upper end of right humerus, initial encounter for closed fracture: Secondary | ICD-10-CM | POA: Diagnosis not present

## 2016-10-15 NOTE — Progress Notes (Signed)
Patient ID: Rachel Dodson, female   DOB: 25-Jul-1946, 70 y.o.   MRN: [id number]  Chief Complaint  Patient presents with  . Shoulder Injury    RIGHT HUMERAL NECK FRACTURE, DOI 10/13/16    HPI Rachel Dodson is a 70 y.o. female.  She fell doing her laundry 2 days ago. She went to the ER she had x-ray shows a proximal humerus fracture she was placed in a shoulder immobilizer. She is on Vicodin for pain.  Location right shoulder. Quality dull ache. Severity mild to moderate. Duration 2 days. Timing constant HPI  Review of Systems Review of Systems  Constitutional: Negative.   Respiratory: Negative.   Cardiovascular: Negative.      Past Medical History:  Diagnosis Date  . Anxiety   . Depression   . Hypertension     Past Surgical History:  Procedure Laterality Date  . ABDOMINAL HYSTERECTOMY    . VIDEO ASSISTED THORACOSCOPY Left 10/23/2013   Procedure: VIDEO ASSISTED THORACOSCOPY;  Surgeon: Grace Isaac, MD;  Location: [facility];  Service: Thoracic;  Laterality: Left;  Marland Kitchen VIDEO BRONCHOSCOPY N/A 10/23/2013   Procedure: VIDEO BRONCHOSCOPY;  Surgeon: Grace Isaac, MD;  Location: [facility] OR;  Service: Thoracic;  Laterality: N/A;      Social History Social History  Substance Use Topics  . Smoking status: Former Smoker    Packs/day: 0.75    Years: 15.00    Types: Cigarettes    Quit date: 08/24/2013  . Smokeless tobacco: Never Used  . Alcohol use No    Allergies  Allergen Reactions  . Latex     Current Outpatient Prescriptions  Medication Sig Dispense Refill  . ALPRAZolam (XANAX) 1 MG tablet Take 1 mg by mouth 2 (two) times daily as needed for anxiety.    Marland Kitchen amitriptyline (ELAVIL) 50 MG tablet Take 50 mg by mouth at bedtime.    Marland Kitchen aspirin 81 MG chewable tablet Chew 81 mg by mouth daily.    . bisoprolol-hydrochlorothiazide (ZIAC) 5-6.25 MG tablet Take 1 tablet by mouth 2 (two) times daily.    . Calcium Carbonate (CALCIUM 600 HIGH POTENCY PO) Take 1 tablet by mouth 2 (two)  times daily.    . Ferrous Sulfate (IRON) 325 (65 FE) MG TABS Take 1 tablet by mouth daily.    Marland Kitchen HYDROcodone-acetaminophen (NORCO/VICODIN) 5-325 MG tablet Take 1 tablet by mouth every 6 (six) hours as needed for severe pain. 6 tablet 0  . levothyroxine (SYNTHROID, LEVOTHROID) 25 MCG tablet Take 25 mcg by mouth daily before breakfast.    . losartan (COZAAR) 50 MG tablet Take 50 mg by mouth daily.    . Omega-3 Fatty Acids (FISH OIL) 1000 MG CAPS Take 2 capsules by mouth daily.    . pantoprazole (PROTONIX) 40 MG tablet Take 40 mg by mouth 2 (two) times daily.     No current facility-administered medications for this visit.        Physical Exam There were no vitals taken for this visit. Physical Exam Ambulatory status Normal with assistance because of a sling  Right proximal humerus is tender there swelling there is ecchymosis and the skin there is minimal motion. Stability tests were deferred. Motor exam is normal in the right upper extremity.   Neurovascular examination is intact  Lymph node palpation is normal  The opposite extremity exhibits normal range of motion stability and strength neurovascular exam is intact, lymph nodes are negative and there is no swelling or tenderness   Data Reviewed  independent image interpretation :  Proximal humerus fracture no fracture fragment is greater than 45 a 1 cm displaced  Assessment    Proximal humerus fracture right shoulder    Plan    X-ray in 3 weeks  Arther Abbott, MD 10/15/2016 11:29 AM

## 2016-10-16 ENCOUNTER — Encounter (HOSPITAL_COMMUNITY): Payer: Self-pay | Admitting: Emergency Medicine

## 2016-10-16 ENCOUNTER — Emergency Department (HOSPITAL_COMMUNITY): Payer: Medicare Other

## 2016-10-16 ENCOUNTER — Observation Stay (HOSPITAL_COMMUNITY)
Admission: EM | Admit: 2016-10-16 | Discharge: 2016-10-19 | Disposition: A | Discharge status: Rehabilitation Facility | Payer: Medicare Other | Attending: Internal Medicine | Admitting: Internal Medicine

## 2016-10-16 DIAGNOSIS — I951 Orthostatic hypotension: Secondary | ICD-10-CM | POA: Diagnosis present

## 2016-10-16 DIAGNOSIS — W19XXXA Unspecified fall, initial encounter: Secondary | ICD-10-CM | POA: Diagnosis not present

## 2016-10-16 DIAGNOSIS — S42291A Other displaced fracture of upper end of right humerus, initial encounter for closed fracture: Secondary | ICD-10-CM | POA: Diagnosis not present

## 2016-10-16 DIAGNOSIS — I700 Atherosclerosis of aorta: Secondary | ICD-10-CM | POA: Insufficient documentation

## 2016-10-16 DIAGNOSIS — Z90710 Acquired absence of both cervix and uterus: Secondary | ICD-10-CM | POA: Diagnosis not present

## 2016-10-16 DIAGNOSIS — K449 Diaphragmatic hernia without obstruction or gangrene: Secondary | ICD-10-CM | POA: Insufficient documentation

## 2016-10-16 DIAGNOSIS — F419 Anxiety disorder, unspecified: Secondary | ICD-10-CM | POA: Diagnosis not present

## 2016-10-16 DIAGNOSIS — M47812 Spondylosis without myelopathy or radiculopathy, cervical region: Secondary | ICD-10-CM | POA: Diagnosis not present

## 2016-10-16 DIAGNOSIS — S199XXA Unspecified injury of neck, initial encounter: Secondary | ICD-10-CM | POA: Diagnosis not present

## 2016-10-16 DIAGNOSIS — M4854XA Collapsed vertebra, not elsewhere classified, thoracic region, initial encounter for fracture: Secondary | ICD-10-CM | POA: Insufficient documentation

## 2016-10-16 DIAGNOSIS — G3189 Other specified degenerative diseases of nervous system: Secondary | ICD-10-CM | POA: Insufficient documentation

## 2016-10-16 DIAGNOSIS — S0990XA Unspecified injury of head, initial encounter: Secondary | ICD-10-CM | POA: Diagnosis not present

## 2016-10-16 DIAGNOSIS — W06XXXA Fall from bed, initial encounter: Secondary | ICD-10-CM | POA: Diagnosis not present

## 2016-10-16 DIAGNOSIS — Y92003 Bedroom of unspecified non-institutional (private) residence as the place of occurrence of the external cause: Secondary | ICD-10-CM | POA: Diagnosis not present

## 2016-10-16 DIAGNOSIS — Z7982 Long term (current) use of aspirin: Secondary | ICD-10-CM | POA: Diagnosis not present

## 2016-10-16 DIAGNOSIS — Z91040 Latex allergy status: Secondary | ICD-10-CM | POA: Insufficient documentation

## 2016-10-16 DIAGNOSIS — I10 Essential (primary) hypertension: Secondary | ICD-10-CM | POA: Insufficient documentation

## 2016-10-16 DIAGNOSIS — R296 Repeated falls: Secondary | ICD-10-CM | POA: Insufficient documentation

## 2016-10-16 DIAGNOSIS — E860 Dehydration: Secondary | ICD-10-CM | POA: Diagnosis not present

## 2016-10-16 DIAGNOSIS — S0101XA Laceration without foreign body of scalp, initial encounter: Secondary | ICD-10-CM | POA: Insufficient documentation

## 2016-10-16 DIAGNOSIS — J449 Chronic obstructive pulmonary disease, unspecified: Secondary | ICD-10-CM | POA: Diagnosis not present

## 2016-10-16 DIAGNOSIS — F329 Major depressive disorder, single episode, unspecified: Secondary | ICD-10-CM | POA: Diagnosis not present

## 2016-10-16 DIAGNOSIS — E871 Hypo-osmolality and hyponatremia: Secondary | ICD-10-CM | POA: Insufficient documentation

## 2016-10-16 DIAGNOSIS — S42301A Unspecified fracture of shaft of humerus, right arm, initial encounter for closed fracture: Secondary | ICD-10-CM | POA: Diagnosis present

## 2016-10-16 DIAGNOSIS — Z803 Family history of malignant neoplasm of breast: Secondary | ICD-10-CM | POA: Insufficient documentation

## 2016-10-16 DIAGNOSIS — Z9181 History of falling: Secondary | ICD-10-CM

## 2016-10-16 DIAGNOSIS — Y998 Other external cause status: Secondary | ICD-10-CM | POA: Insufficient documentation

## 2016-10-16 DIAGNOSIS — Z87891 Personal history of nicotine dependence: Secondary | ICD-10-CM | POA: Insufficient documentation

## 2016-10-16 DIAGNOSIS — Y9384 Activity, sleeping: Secondary | ICD-10-CM | POA: Insufficient documentation

## 2016-10-16 DIAGNOSIS — E039 Hypothyroidism, unspecified: Secondary | ICD-10-CM | POA: Diagnosis not present

## 2016-10-16 DIAGNOSIS — S299XXA Unspecified injury of thorax, initial encounter: Secondary | ICD-10-CM | POA: Diagnosis not present

## 2016-10-16 DIAGNOSIS — S098XXA Other specified injuries of head, initial encounter: Secondary | ICD-10-CM | POA: Diagnosis not present

## 2016-10-16 DIAGNOSIS — S0190XA Unspecified open wound of unspecified part of head, initial encounter: Secondary | ICD-10-CM | POA: Diagnosis not present

## 2016-10-16 LAB — COMPREHENSIVE METABOLIC PANEL
ALBUMIN: 3.2 g/dL — AB (ref 3.5–5.0)
ALK PHOS: 98 U/L (ref 38–126)
ALT: 11 U/L — ABNORMAL LOW (ref 14–54)
ANION GAP: 8 (ref 5–15)
AST: 15 U/L (ref 15–41)
BUN: 11 mg/dL (ref 6–20)
CALCIUM: 8.1 mg/dL — AB (ref 8.9–10.3)
CHLORIDE: 91 mmol/L — AB (ref 101–111)
CO2: 25 mmol/L (ref 22–32)
Creatinine, Ser: 1.09 mg/dL — ABNORMAL HIGH (ref 0.44–1.00)
GFR calc non Af Amer: 50 mL/min — ABNORMAL LOW (ref >60)
GFR, EST AFRICAN AMERICAN: 58 mL/min — AB (ref >60)
GLUCOSE: 93 mg/dL (ref 65–99)
POTASSIUM: 3.9 mmol/L (ref 3.5–5.1)
SODIUM: 124 mmol/L — AB (ref 135–145)
Total Bilirubin: 0.7 mg/dL (ref 0.3–1.2)
Total Protein: 5.8 g/dL — ABNORMAL LOW (ref 6.5–8.1)

## 2016-10-16 LAB — CBC WITH DIFFERENTIAL/PLATELET
BASOS PCT: 0 %
Basophils Absolute: 0 10*3/uL (ref 0.0–0.1)
EOS ABS: 0 10*3/uL (ref 0.0–0.7)
EOS PCT: 1 %
HCT: 32.4 % — ABNORMAL LOW (ref 36.0–46.0)
Hemoglobin: 11 g/dL — ABNORMAL LOW (ref 12.0–15.0)
LYMPHS ABS: 1.5 10*3/uL (ref 0.7–4.0)
Lymphocytes Relative: 24 %
MCH: 31 pg (ref 26.0–34.0)
MCHC: 34 g/dL (ref 30.0–36.0)
MCV: 91.3 fL (ref 78.0–100.0)
MONO ABS: 0.6 10*3/uL (ref 0.1–1.0)
MONOS PCT: 9 %
NEUTROS PCT: 66 %
Neutro Abs: 4.3 10*3/uL (ref 1.7–7.7)
PLATELETS: 135 10*3/uL — AB (ref 150–400)
RBC: 3.55 MIL/uL — ABNORMAL LOW (ref 3.87–5.11)
RDW: 12.9 % (ref 11.5–15.5)
WBC: 6.4 10*3/uL (ref 4.0–10.5)

## 2016-10-16 LAB — URINE MICROSCOPIC-ADD ON: RBC / HPF: NONE SEEN RBC/hpf (ref 0–5)

## 2016-10-16 LAB — URINALYSIS, ROUTINE W REFLEX MICROSCOPIC
BILIRUBIN URINE: NEGATIVE
Glucose, UA: NEGATIVE mg/dL
Hgb urine dipstick: NEGATIVE
Ketones, ur: NEGATIVE mg/dL
NITRITE: NEGATIVE
Protein, ur: NEGATIVE mg/dL
SPECIFIC GRAVITY, URINE: 1.01 (ref 1.005–1.030)
pH: 6 (ref 5.0–8.0)

## 2016-10-16 LAB — PROTIME-INR
INR: 1.04
PROTHROMBIN TIME: 13.6 s (ref 11.4–15.2)

## 2016-10-16 LAB — LACTIC ACID, PLASMA: Lactic Acid, Venous: 1.3 mmol/L (ref 0.5–1.9)

## 2016-10-16 MED ORDER — DEXTROSE 5 % IV SOLN
1.0000 g | Freq: Once | INTRAVENOUS | Status: AC
  Administered 2016-10-16: 1 g via INTRAVENOUS
  Filled 2016-10-16: qty 10

## 2016-10-16 MED ORDER — TETANUS-DIPHTH-ACELL PERTUSSIS 5-2.5-18.5 LF-MCG/0.5 IM SUSP
0.5000 mL | Freq: Once | INTRAMUSCULAR | Status: AC
  Administered 2016-10-16: 0.5 mL via INTRAMUSCULAR
  Filled 2016-10-16: qty 0.5

## 2016-10-16 MED ORDER — LIDOCAINE-EPINEPHRINE (PF) 1 %-1:200000 IJ SOLN
10.0000 mL | Freq: Once | INTRAMUSCULAR | Status: AC
  Administered 2016-10-16: 10 mL via INTRADERMAL
  Filled 2016-10-16: qty 10

## 2016-10-16 MED ORDER — POVIDONE-IODINE 10 % EX SOLN
CUTANEOUS | Status: AC
  Filled 2016-10-16: qty 118

## 2016-10-16 MED ORDER — SODIUM CHLORIDE 0.9 % IV BOLUS (SEPSIS)
1000.0000 mL | Freq: Once | INTRAVENOUS | Status: AC
  Administered 2016-10-16: 1000 mL via INTRAVENOUS

## 2016-10-16 MED ORDER — LIDOCAINE-EPINEPHRINE (PF) 1 %-1:200000 IJ SOLN
INTRAMUSCULAR | Status: AC
  Filled 2016-10-16: qty 30

## 2016-10-16 MED FILL — Hydrocodone-Acetaminophen Tab 5-325 MG: ORAL | Qty: 6 | Status: AC

## 2016-10-16 NOTE — ED Notes (Signed)
Patient transported to X-ray 

## 2016-10-16 NOTE — ED Provider Notes (Signed)
[facility] DEPT Provider Note   CSN: [id number] Arrival date & time: 10/16/16  1735     History   Chief Complaint Chief Complaint  Patient presents with  . Fall    HPI Rachel Dodson is a 70 y.o. female with a past medical history significant for hypertension, depression, anxiety, And recent humerus fracture who presents for recurrent fall causing head injury. Patient is accompanied by family who report that the patient has had a fall every few days for the last month. They report that she lives alone with a dog. They say that she fell and broke her right humerus two days ago. She is currently in a sling. Patient reports that she tried to sit up in bed and slid off striking her head on the metal bed frame. Patient did not lose consciousness but has a scalp laceration. Patient reports headache and neck pain but denies vision changes, nausea, vomiting. Patient reports that her urine has been Bendavid and foul-smelling. She denies abdominal pain, chest pain, or cough. She reports she is not eating as well over the last two days. Family is concerned about her safety with recurrent falls. Patient is unsure of her last tetanus shot. Patient denies any other complaints on arrival.  Fall  This is a recurrent problem. The current episode started 1 to 2 hours ago. The problem occurs every several days. The problem has not changed since onset.Associated symptoms include headaches. Pertinent negatives include no chest pain, no abdominal pain and no shortness of breath. Nothing aggravates the symptoms. Nothing relieves the symptoms. She has tried nothing for the symptoms. The treatment provided no relief.  Laceration   The incident occurred 1 to 2 hours ago. The laceration is located on the scalp. The laceration is 2 cm in size. The laceration mechanism was a a metal edge. The pain is moderate. The pain has been constant since onset. She reports no foreign bodies present. Her tetanus status is out of  date.    Past Medical History:  Diagnosis Date  . Anxiety   . Depression   . Hypertension     Patient Active Problem List   Diagnosis Date Noted  . Laceration of fingers without complication [id number]  . Hypokalemia 01/28/2014  . ARF (acute renal failure) ([place]) 01/25/2014  . Rhabdomyolysis 01/25/2014  . Dehydration 01/25/2014  . Fall 01/25/2014  . Dementia 01/25/2014  . COPD  GOLD II 01/08/2014  . Hyponatremia 11/10/2013  . Hypotension, unspecified 10/23/2013  . Leukocytosis, unspecified 10/23/2013  . Acute respiratory failure ([place]) 10/23/2013  . Lung nodule 10/20/2013  . UTI (urinary tract infection) 10/20/2013    Past Surgical History:  Procedure Laterality Date  . ABDOMINAL HYSTERECTOMY    . VIDEO ASSISTED THORACOSCOPY Left 10/23/2013   Procedure: VIDEO ASSISTED THORACOSCOPY;  Surgeon: Grace Isaac, MD;  Location: [facility];  Service: Thoracic;  Laterality: Left;  Marland Kitchen VIDEO BRONCHOSCOPY N/A 10/23/2013   Procedure: VIDEO BRONCHOSCOPY;  Surgeon: Grace Isaac, MD;  Location: [facility] OR;  Service: Thoracic;  Laterality: N/A;    OB History    No data available       Home Medications    Prior to Admission medications   Medication Sig Start Date End Date Taking? Authorizing Provider  ALPRAZolam Duanne Moron) 1 MG tablet Take 1 mg by mouth 2 (two) times daily as needed for anxiety.    Historical Provider, MD  amitriptyline (ELAVIL) 50 MG tablet Take 50 mg by mouth at bedtime.    Historical  Provider, MD  aspirin 81 MG chewable tablet Chew 81 mg by mouth daily.    Historical Provider, MD  bisoprolol-hydrochlorothiazide (ZIAC) 5-6.25 MG tablet Take 1 tablet by mouth 2 (two) times daily.    Historical Provider, MD  Calcium Carbonate (CALCIUM 600 HIGH POTENCY PO) Take 1 tablet by mouth 2 (two) times daily.    Historical Provider, MD  Ferrous Sulfate (IRON) 325 (65 FE) MG TABS Take 1 tablet by mouth daily.    Historical Provider, MD  HYDROcodone-acetaminophen (NORCO/VICODIN) 5-325  MG tablet Take 1 tablet by mouth every 6 (six) hours as needed for severe pain. 10/13/16   Carmin Muskrat, MD  levothyroxine (SYNTHROID, LEVOTHROID) 25 MCG tablet Take 25 mcg by mouth daily before breakfast.    Historical Provider, MD  losartan (COZAAR) 50 MG tablet Take 50 mg by mouth daily. 09/29/13   Historical Provider, MD  Omega-3 Fatty Acids (FISH OIL) 1000 MG CAPS Take 2 capsules by mouth daily.    Historical Provider, MD  pantoprazole (PROTONIX) 40 MG tablet Take 40 mg by mouth 2 (two) times daily. 06/07/14   Historical Provider, MD    Family History History reviewed. No pertinent family history.  Social History Social History  Substance Use Topics  . Smoking status: Former Smoker    Packs/day: 0.75    Years: 15.00    Types: Cigarettes    Quit date: 08/24/2013  . Smokeless tobacco: Never Used  . Alcohol use No     Allergies   Latex   Review of Systems Review of Systems  Constitutional: Negative for activity change, chills, diaphoresis, fatigue and fever.  HENT: Negative for congestion and rhinorrhea.   Eyes: Negative for visual disturbance.  Respiratory: Negative for cough, chest tightness, shortness of breath and stridor.   Cardiovascular: Negative for chest pain, palpitations and leg swelling.  Gastrointestinal: Negative for abdominal distention, abdominal pain, constipation, diarrhea, nausea and vomiting.  Genitourinary: Positive for frequency. Negative for difficulty urinating, dysuria, flank pain, hematuria, menstrual problem, pelvic pain, vaginal bleeding and vaginal discharge.  Musculoskeletal: Negative for back pain and neck pain.  Skin: Positive for color change (bruising on R ar) and wound. Negative for rash.  Neurological: Positive for headaches. Negative for dizziness, weakness, light-headedness and numbness.  Psychiatric/Behavioral: Negative for agitation and confusion.  All other systems reviewed and are negative.    Physical Exam Updated Vital  Signs BP 111/86 (BP Location: Left Arm)   Pulse 81   Temp 97.9 F (36.6 C) (Oral)   Resp 20   Ht 5\' 4"  (1.626 m)   Wt 130 lb (59 kg)   SpO2 96%   BMI 22.31 kg/m   Physical Exam  Constitutional: She is oriented to person, place, and time. She appears well-developed and well-nourished. No distress.  HENT:  Head: Head is with laceration.    Right Ear: External ear normal.  Left Ear: External ear normal.  Mouth/Throat: Oropharynx is clear and moist. No oropharyngeal exudate.  Eyes: Conjunctivae and EOM are normal. Pupils are equal, round, and reactive to light.  Neck: Normal range of motion. Neck supple.  Cardiovascular: Normal rate, regular rhythm and intact distal pulses.   No murmur heard. Pulmonary/Chest: Effort normal and breath sounds normal. No stridor. No respiratory distress. She has no wheezes. She exhibits no tenderness.  Abdominal: Soft. There is no tenderness.  Musculoskeletal: She exhibits no edema.       Right upper arm: She exhibits tenderness and swelling.       Arms:  Neurological: She is alert and oriented to person, place, and time. She has normal reflexes. She displays normal reflexes. No cranial nerve deficit or sensory deficit. She exhibits normal muscle tone. Coordination normal. GCS eye subscore is 4. GCS verbal subscore is 5. GCS motor subscore is 6.  Skin: Skin is warm and dry. Capillary refill takes less than 2 seconds. No erythema.  Psychiatric: She has a normal mood and affect.  Nursing note and vitals reviewed.    ED Treatments / Results  Labs (all labs ordered are listed, but only abnormal results are displayed) Labs Reviewed  CBC WITH DIFFERENTIAL/PLATELET - Abnormal; Notable for the following:       Result Value   RBC 3.55 (*)    Hemoglobin 11.0 (*)    HCT 32.4 (*)    Platelets 135 (*)    All other components within normal limits  COMPREHENSIVE METABOLIC PANEL - Abnormal; Notable for the following:    Sodium 124 (*)    Chloride 91 (*)     Creatinine, Ser 1.09 (*)    Calcium 8.1 (*)    Total Protein 5.8 (*)    Albumin 3.2 (*)    ALT 11 (*)    GFR calc non Af Amer 50 (*)    GFR calc Af Amer 58 (*)    All other components within normal limits  URINALYSIS, ROUTINE W REFLEX MICROSCOPIC (NOT AT [facility]) - Abnormal; Notable for the following:    Leukocytes, UA SMALL (*)    All other components within normal limits  URINE MICROSCOPIC-ADD ON - Abnormal; Notable for the following:    Squamous Epithelial / LPF 0-5 (*)    Bacteria, UA FEW (*)    All other components within normal limits  CBC - Abnormal; Notable for the following:    RBC 3.23 (*)    Hemoglobin 10.0 (*)    HCT 29.2 (*)    Platelets 131 (*)    All other components within normal limits  COMPREHENSIVE METABOLIC PANEL - Abnormal; Notable for the following:    Sodium 125 (*)    Chloride 93 (*)    Calcium 7.8 (*)    Total Protein 5.4 (*)    Albumin 3.0 (*)    AST 14 (*)    ALT 9 (*)    GFR calc non Af Amer 56 (*)    All other components within normal limits  OSMOLALITY - Abnormal; Notable for the following:    Osmolality 264 (*)    All other components within normal limits  MRSA PCR SCREENING  URINE CULTURE  LACTIC ACID, PLASMA  PROTIME-INR  TSH    EKG  EKG Interpretation None       Radiology Dg Chest 2 View  Result Date: 10/16/2016 CLINICAL DATA:  70 y/o F; patient fell out of bed with laceration noted to the right side of head. EXAM: CHEST  2 VIEW COMPARISON:  11/24/2015 chest CT FINDINGS: Stable cardiac silhouette within normal limits given projection and technique. Dense mitral annular calcification. Aortic atherosclerosis with arch calcification. Large grossly stable hiatal hernia. No focal consolidation. No pneumothorax or effusion. IMPRESSION: No active cardiopulmonary disease. Large hiatal hernia. Aortic atherosclerosis. Electronically Signed   By: Kristine Garbe M.D.   On: 10/16/2016 20:15   Ct Head Wo Contrast  Result Date:  10/16/2016 CLINICAL DATA:  Status post fall out of bed today with a blow to the head. Scalp laceration. Initial encounter. EXAM: CT HEAD WITHOUT CONTRAST CT CERVICAL SPINE WITHOUT  CONTRAST TECHNIQUE: Multidetector CT imaging of the head and cervical spine was performed following the standard protocol without intravenous contrast. Multiplanar CT image reconstructions of the cervical spine were also generated. COMPARISON:  Head CT scan 01/25/2014.  CT chest 03/24/2014. FINDINGS: CT HEAD FINDINGS Brain: There is some cortical atrophy and chronic microvascular ischemic change. No evidence of acute intracranial abnormality including hemorrhage, infarct, mass lesion, mass effect, midline shift or abnormal extra-axial fluid collection is identified. No hydrocephalus or pneumocephalus. Vascular: Atherosclerosis is noted. Skull: Intact. Sinuses/Orbits: Unremarkable. Other: Large scalp hematoma on the right is identified. CT CERVICAL SPINE FINDINGS Alignment: Maintained. Skull base and vertebrae: Mild superior endplate compression fractures of T3, T4 and T5 are age indeterminate but new since the comparison chest CT. No extension into the posterior elements is identified. No cervical spine fracture is seen. Soft tissues and spinal canal: No prevertebral fluid or swelling. No visible canal hematoma. Disc levels: Multilevel loss of disc space height and endplate spurring appear worst at C4-5, C5-6 and C6-7. Upper chest: Lung apices are clear. Atherosclerotic vascular disease is noted. Other: None. IMPRESSION: Large scalp hematoma on the right without underlying fracture or acute intracranial abnormality. Mild age-indeterminate superior endplate compression fractures of T3, T4 and T5 are new since 2015 but age indeterminate. Atrophy and chronic microvascular ischemic change. Cervical spondylosis. Electronically Signed   By: Inge Rise M.D.   On: 10/16/2016 20:33   Ct Cervical Spine Wo Contrast  Result Date:  10/16/2016 CLINICAL DATA:  Status post fall out of bed today with a blow to the head. Scalp laceration. Initial encounter. EXAM: CT HEAD WITHOUT CONTRAST CT CERVICAL SPINE WITHOUT CONTRAST TECHNIQUE: Multidetector CT imaging of the head and cervical spine was performed following the standard protocol without intravenous contrast. Multiplanar CT image reconstructions of the cervical spine were also generated. COMPARISON:  Head CT scan 01/25/2014.  CT chest 03/24/2014. FINDINGS: CT HEAD FINDINGS Brain: There is some cortical atrophy and chronic microvascular ischemic change. No evidence of acute intracranial abnormality including hemorrhage, infarct, mass lesion, mass effect, midline shift or abnormal extra-axial fluid collection is identified. No hydrocephalus or pneumocephalus. Vascular: Atherosclerosis is noted. Skull: Intact. Sinuses/Orbits: Unremarkable. Other: Large scalp hematoma on the right is identified. CT CERVICAL SPINE FINDINGS Alignment: Maintained. Skull base and vertebrae: Mild superior endplate compression fractures of T3, T4 and T5 are age indeterminate but new since the comparison chest CT. No extension into the posterior elements is identified. No cervical spine fracture is seen. Soft tissues and spinal canal: No prevertebral fluid or swelling. No visible canal hematoma. Disc levels: Multilevel loss of disc space height and endplate spurring appear worst at C4-5, C5-6 and C6-7. Upper chest: Lung apices are clear. Atherosclerotic vascular disease is noted. Other: None. IMPRESSION: Large scalp hematoma on the right without underlying fracture or acute intracranial abnormality. Mild age-indeterminate superior endplate compression fractures of T3, T4 and T5 are new since 2015 but age indeterminate. Atrophy and chronic microvascular ischemic change. Cervical spondylosis. Electronically Signed   By: Inge Rise M.D.   On: 10/16/2016 20:33    Procedures .Marland KitchenLaceration Repair Date/Time: 10/17/2016  2:25 PM Performed by: Courtney Paris Authorized by: Courtney Paris   Consent:    Consent obtained:  Verbal   Consent given by:  Patient   Risks discussed:  Infection and pain   Alternatives discussed:  No treatment and observation Anesthesia (see MAR for exact dosages):    Anesthesia method:  Local infiltration   Local anesthetic:  Lidocaine  1% WITH epi Laceration details:    Location:  Scalp   Scalp location:  R temporal   Length (cm):  2   Depth (mm):  3 Repair type:    Repair type:  Simple Pre-procedure details:    Preparation:  Patient was prepped and draped in usual sterile fashion and imaging obtained to evaluate for foreign bodies Exploration:    Hemostasis achieved with:  Direct pressure   Wound exploration: entire depth of wound probed and visualized     Wound extent: no foreign bodies/material noted, no underlying fracture noted and no vascular damage noted     Contaminated: no   Treatment:    Area cleansed with:  Saline   Amount of cleaning:  Standard   Irrigation solution:  Sterile saline   Irrigation volume:  500cc   Irrigation method:  Syringe   Visualized foreign bodies/material removed: no   Skin repair:    Repair method:  Staples   Number of staples:  5 Approximation:    Approximation:  Close   Vermilion border: well-aligned   Post-procedure details:    Dressing:  Antibiotic ointment   Patient tolerance of procedure:  Tolerated well, no immediate complications   (including critical care time)  Medications Ordered in ED Medications  HYDROcodone-acetaminophen (NORCO/VICODIN) 5-325 MG per tablet 1 tablet (1 tablet Oral Given 10/17/16 0844)  FLUoxetine (PROZAC) capsule 20 mg (20 mg Oral Given 10/17/16 0844)  ALPRAZolam (XANAX) tablet 1 mg (not administered)  amitriptyline (ELAVIL) tablet 100 mg (not administered)  levothyroxine (SYNTHROID, LEVOTHROID) tablet 25 mcg (25 mcg Oral Given 10/17/16 0739)  losartan (COZAAR) tablet 50 mg (50 mg  Oral Given 10/17/16 0844)  pantoprazole (PROTONIX) EC tablet 40 mg (40 mg Oral Given 10/17/16 0844)  bisoprolol (ZEBETA) tablet 5 mg (5 mg Oral Given 10/17/16 1114)  0.9 %  sodium chloride infusion ( Intravenous New Bag/Given 10/17/16 0227)  acetaminophen (TYLENOL) tablet 650 mg (650 mg Oral Given 10/17/16 0739)    Or  acetaminophen (TYLENOL) suppository 650 mg ( Rectal See Alternative 10/17/16 0739)  ondansetron (ZOFRAN) tablet 4 mg (not administered)    Or  ondansetron (ZOFRAN) injection 4 mg (not administered)  oxyCODONE (Oxy IR/ROXICODONE) immediate release tablet 5 mg (5 mg Oral Given 10/17/16 1339)  sodium chloride 0.9 % bolus 1,000 mL (0 mLs Intravenous Stopped 10/16/16 2208)  Tdap (BOOSTRIX) injection 0.5 mL (0.5 mLs Intramuscular Given 10/16/16 2019)  cefTRIAXone (ROCEPHIN) 1 g in dextrose 5 % 50 mL IVPB (0 g Intravenous Stopped 10/17/16 0010)  lidocaine-EPINEPHrine (XYLOCAINE-EPINEPHrine) 1 %-1:200000 (PF) injection 10 mL (10 mLs Intradermal Given 10/16/16 2340)  polymixin-bacitracin (POLYSPORIN) ointment (1 application Topical Given 10/17/16 0032)  HYDROcodone-acetaminophen (NORCO/VICODIN) 5-325 MG per tablet 1 tablet (1 tablet Oral Given 10/17/16 0120)     Initial Impression / Assessment and Plan / ED Course  I have reviewed the triage vital signs and the nursing notes.  Pertinent labs & imaging results that were available during my care of the patient were reviewed by me and considered in my medical decision making (see chart for details).  Clinical Course    Rachel Dodson is a 70 y.o. female with a past medical history significant for hypertension, depression, anxiety, And recent humerus fracture who presents for recurrent fall causing head injury  History and exam are seen above.  Exam significant for bruising on right upper arm secondary to recent humerus fracture. Patient is neurovascularly intact and extremities. Normal pulses. Patient has two similar laceration and  right  scalp. Hemostatic. Contusion and hematoma are present. Narrow exam otherwise intact. Lungs clear and abdomen nontender.  CT head and neck was ordered. No evidence of intracranial injury or fracture. When repaired as described above with Staples. Age indeterminate thoracic compression fractures were also seen. No back tenderness on exam. Do not feel these are acute based on exam.  Workup included labs to look for occult infection or electrolyte abnormalities which might contribute to falls. In the setting of foul smelling urine and frequency, labs revealed UTI. Patient also hyponatremic.   Patient given Rocephin for UTI and fluids for hyponatremia. Patient admitted for recurrent falls, infection, Electrolyte problems in setting of poor PO intake and to be assessed by PT/OT.  Patient admitted to hospitalist service in stable condition.   Final Clinical Impressions(s) / ED Diagnoses   Final diagnoses:  Fall  Hyponatremia  Laceration of scalp, initial encounter     Clinical Impression: 1. Hyponatremia   2. Fall   3. Laceration of scalp, initial encounter   4. History of falling     Disposition: Admit to Hospitalist service    Courtney Paris, MD 10/17/16 1426

## 2016-10-16 NOTE — H&P (Signed)
TRH H&P    Patient Demographics:    Rachel Dodson, is a 70 y.o. female  MRN: [id number]  DOB - [birth year]  Admit Date - 10/16/2016  Referring MD/NP/PA: Dr. Gustavus Messing  Outpatient Primary MD for the patient is Glo Herring., MD  Patient coming from: Home  Chief Complaint  Patient presents with  . Fall      HPI:    Rachel Dodson  is a 70 y.o. female, With history of hypertension, anxiety, depression came to the hospital with chief complaint of fall. Patient is having recurrent falls over the past few days she was seen yesterday by orthopedics as she sustained a proximal humerus fracture after a fall at home. She was placed in shoulder immobilizer.  Today in patient fell hitting her head, CT head showed scalp hematoma. Patient denies passing out. No dizziness. She denies nausea vomiting or diarrhea. No chest pain or shortness of breath. She denies dysuria.  In the ED lab work showed sodium 124.    Review of systems:      A full 10 point Review of Systems was done, except as stated above, all other Review of Systems were negative.   With Past History of the following :    Past Medical History:  Diagnosis Date  . Anxiety   . Depression   . Hypertension       Past Surgical History:  Procedure Laterality Date  . ABDOMINAL HYSTERECTOMY    . VIDEO ASSISTED THORACOSCOPY Left 10/23/2013   Procedure: VIDEO ASSISTED THORACOSCOPY;  Surgeon: Grace Isaac, MD;  Location: [place];  Service: Thoracic;  Laterality: Left;  Marland Kitchen VIDEO BRONCHOSCOPY N/A 10/23/2013   Procedure: VIDEO BRONCHOSCOPY;  Surgeon: Grace Isaac, MD;  Location: [facility];  Service: Thoracic;  Laterality: N/A;      Social History:      Social History  Substance Use Topics  . Smoking status: Former Smoker    Packs/day: 0.75    Years: 15.00    Types: Cigarettes    Quit date: 08/24/2013  . Smokeless tobacco: Never Used  .  Alcohol use No       Family History :   Patient's mother had breast cancer   Home Medications:   Prior to Admission medications   Medication Sig Start Date End Date Taking? Authorizing Provider  ALPRAZolam Duanne Moron) 1 MG tablet Take 1 mg by mouth 2 (two) times daily as needed for anxiety.   Yes Historical Provider, MD  amitriptyline (ELAVIL) 100 MG tablet Take 100 mg by mouth at bedtime.    Yes Historical Provider, MD  bisoprolol-hydrochlorothiazide (ZIAC) 5-6.25 MG tablet Take 1 tablet by mouth 2 (two) times daily.   Yes Historical Provider, MD  FLUoxetine (PROZAC) 20 MG capsule Take 20 mg by mouth daily.   Yes Historical Provider, MD  HYDROcodone-acetaminophen (NORCO/VICODIN) 5-325 MG tablet Take 1 tablet by mouth every 6 (six) hours as needed for severe pain. 10/13/16  Yes Carmin Muskrat, MD  levothyroxine (SYNTHROID, LEVOTHROID) 25 MCG tablet Take 25 mcg by mouth daily before breakfast.  Yes Historical Provider, MD  losartan (COZAAR) 50 MG tablet Take 50 mg by mouth daily. 09/29/13  Yes Historical Provider, MD  oxyCODONE (ROXICODONE) 15 MG immediate release tablet Take 15 mg by mouth every 4 (four) hours as needed for pain.   Yes Historical Provider, MD  pantoprazole (PROTONIX) 40 MG tablet Take 40 mg by mouth daily as needed (for acid reflux).  06/07/14   Historical Provider, MD     Allergies:     Allergies  Allergen Reactions  . Latex      Physical Exam:   Vitals  Blood pressure 134/64, pulse 76, temperature 97.9 F (36.6 C), temperature source Oral, resp. rate 16, height 5\' 4"  (1.626 m), weight 59 kg (130 lb), SpO2 97 %.  1.  General: Appears in no acute distress  2. Psychiatric:  Intact judgement and  insight, awake alert, oriented x 3.  3. Neurologic: No focal neurological deficits, all cranial nerves intact.Strength 5/5 all 4 extremities, sensation intact all 4 extremities, plantars down going.  4. Eyes :  anicteric sclerae, moist conjunctivae with no lid lag.  PERRLA.  5. HENMT:  Scalp laceration noted on right  6. Neck:  supple, no cervical lymphadenopathy appriciated, No thyromegaly  7. Respiratory : Normal respiratory effort, good air movement bilaterally,clear to  auscultation bilaterally  8. Cardiovascular : RRR, no gallops, rubs or murmurs, no leg edema  9. Gastrointestinal:  Positive bowel sounds, abdomen soft, non-tender to palpation,no hepatosplenomegaly, no rigidity or guarding       10. Skin:  No cyanosis, normal texture and turgor, no rash, lesions or ulcers  11.Musculoskeletal:  Good muscle tone,  joints appear normal , no effusions,  normal range of motion    Data Review:    CBC  Recent Labs Lab 10/16/16 2117  WBC 6.4  HGB 11.0*  HCT 32.4*  PLT 135*  MCV 91.3  MCH 31.0  MCHC 34.0  RDW 12.9  LYMPHSABS 1.5  MONOABS 0.6  EOSABS 0.0  BASOSABS 0.0   ------------------------------------------------------------------------------------------------------------------  Chemistries   Recent Labs Lab 10/16/16 2117  NA 124*  K 3.9  CL 91*  CO2 25  GLUCOSE 93  BUN 11  CREATININE 1.09*  CALCIUM 8.1*  AST 15  ALT 11*  ALKPHOS 98  BILITOT 0.7   ------------------------------------------------------------------------------------------------------------------  ------------------------------------------------------------------------------------------------------------------ GFR: Estimated Creatinine Clearance: 40.9 mL/min (by C-G formula based on SCr of 1.09 mg/dL (H)). Liver Function Tests:  Recent Labs Lab 10/16/16 2117  AST 15  ALT 11*  ALKPHOS 98  BILITOT 0.7  PROT 5.8*  ALBUMIN 3.2*   No results for input(s): LIPASE, AMYLASE in the last 168 hours. No results for input(s): AMMONIA in the last 168 hours. Coagulation Profile:  Recent Labs Lab 10/16/16 2117  INR 1.04    --------------------------------------------------------------------------------------------------------------- Urine  analysis:    Component Value Date/Time   COLORURINE YELLOW 10/16/2016 1928   APPEARANCEUR CLEAR 10/16/2016 1928   LABSPEC 1.010 10/16/2016 1928   PHURINE 6.0 10/16/2016 1928   GLUCOSEU NEGATIVE 10/16/2016 1928   HGBUR NEGATIVE 10/16/2016 1928   BILIRUBINUR NEGATIVE 10/16/2016 1928   KETONESUR NEGATIVE 10/16/2016 1928   PROTEINUR NEGATIVE 10/16/2016 1928   UROBILINOGEN 0.2 01/29/2014 1642   NITRITE NEGATIVE 10/16/2016 1928   LEUKOCYTESUR SMALL (A) 10/16/2016 1928      Imaging Results:    Dg Chest 2 View  Result Date: 10/16/2016 CLINICAL DATA:  70 y/o F; patient fell out of bed with laceration noted to the right side of head. EXAM: CHEST  2 VIEW COMPARISON:  11/24/2015 chest CT FINDINGS: Stable cardiac silhouette within normal limits given projection and technique. Dense mitral annular calcification. Aortic atherosclerosis with arch calcification. Large grossly stable hiatal hernia. No focal consolidation. No pneumothorax or effusion. IMPRESSION: No active cardiopulmonary disease. Large hiatal hernia. Aortic atherosclerosis. Electronically Signed   By: Kristine Garbe M.D.   On: 10/16/2016 20:15   Ct Head Wo Contrast  Result Date: 10/16/2016 CLINICAL DATA:  Status post fall out of bed today with a blow to the head. Scalp laceration. Initial encounter. EXAM: CT HEAD WITHOUT CONTRAST CT CERVICAL SPINE WITHOUT CONTRAST TECHNIQUE: Multidetector CT imaging of the head and cervical spine was performed following the standard protocol without intravenous contrast. Multiplanar CT image reconstructions of the cervical spine were also generated. COMPARISON:  Head CT scan 01/25/2014.  CT chest 03/24/2014. FINDINGS: CT HEAD FINDINGS Brain: There is some cortical atrophy and chronic microvascular ischemic change. No evidence of acute intracranial abnormality including hemorrhage, infarct, mass lesion, mass effect, midline shift or abnormal extra-axial fluid collection is identified. No  hydrocephalus or pneumocephalus. Vascular: Atherosclerosis is noted. Skull: Intact. Sinuses/Orbits: Unremarkable. Other: Large scalp hematoma on the right is identified. CT CERVICAL SPINE FINDINGS Alignment: Maintained. Skull base and vertebrae: Mild superior endplate compression fractures of T3, T4 and T5 are age indeterminate but new since the comparison chest CT. No extension into the posterior elements is identified. No cervical spine fracture is seen. Soft tissues and spinal canal: No prevertebral fluid or swelling. No visible canal hematoma. Disc levels: Multilevel loss of disc space height and endplate spurring appear worst at C4-5, C5-6 and C6-7. Upper chest: Lung apices are clear. Atherosclerotic vascular disease is noted. Other: None. IMPRESSION: Large scalp hematoma on the right without underlying fracture or acute intracranial abnormality. Mild age-indeterminate superior endplate compression fractures of T3, T4 and T5 are new since 2015 but age indeterminate. Atrophy and chronic microvascular ischemic change. Cervical spondylosis. Electronically Signed   By: Inge Rise M.D.   On: 10/16/2016 20:33   Ct Cervical Spine Wo Contrast  Result Date: 10/16/2016 CLINICAL DATA:  Status post fall out of bed today with a blow to the head. Scalp laceration. Initial encounter. EXAM: CT HEAD WITHOUT CONTRAST CT CERVICAL SPINE WITHOUT CONTRAST TECHNIQUE: Multidetector CT imaging of the head and cervical spine was performed following the standard protocol without intravenous contrast. Multiplanar CT image reconstructions of the cervical spine were also generated. COMPARISON:  Head CT scan 01/25/2014.  CT chest 03/24/2014. FINDINGS: CT HEAD FINDINGS Brain: There is some cortical atrophy and chronic microvascular ischemic change. No evidence of acute intracranial abnormality including hemorrhage, infarct, mass lesion, mass effect, midline shift or abnormal extra-axial fluid collection is identified. No  hydrocephalus or pneumocephalus. Vascular: Atherosclerosis is noted. Skull: Intact. Sinuses/Orbits: Unremarkable. Other: Large scalp hematoma on the right is identified. CT CERVICAL SPINE FINDINGS Alignment: Maintained. Skull base and vertebrae: Mild superior endplate compression fractures of T3, T4 and T5 are age indeterminate but new since the comparison chest CT. No extension into the posterior elements is identified. No cervical spine fracture is seen. Soft tissues and spinal canal: No prevertebral fluid or swelling. No visible canal hematoma. Disc levels: Multilevel loss of disc space height and endplate spurring appear worst at C4-5, C5-6 and C6-7. Upper chest: Lung apices are clear. Atherosclerotic vascular disease is noted. Other: None. IMPRESSION: Large scalp hematoma on the right without underlying fracture or acute intracranial abnormality. Mild age-indeterminate superior endplate compression fractures of T3, T4 and T5 are  new since 2015 but age indeterminate. Atrophy and chronic microvascular ischemic change. Cervical spondylosis. Electronically Signed   By: Inge Rise M.D.   On: 10/16/2016 20:33       Assessment & Plan:    Active Problems:   Hyponatremia   Fall   1. Recurrent mechanical fall- patient is having recurrent falls at home, will place under observation, obtain PT OT evaluation. 2. Hyponatremia-likely from HCTZ, check serum was bloody. Hold HCTZ at this time. Patient is not dehydrated, she does have a history of grade 1 diastolic dysfunction. We will keep patient on 1200 mL fluid restriction. Check BMP in a.m. 3. Scalp laceration/hematoma- staples applied in the ED. 4. Hypertension- continue bisoprolol 5 mg twice a day, Cozaar 50 mg daily. Hold HCTZ due to hyponatremia. 5. Hypothyroidism- check TSH, continue Synthroid 25 g by mouth daily 6. Depression- continue Prozac, Xanax 1 mg 2 times daily when necessary 7. Compression fractures (thoracic)- CT cervical spine showed  mild age indeterminate superior endplate compression fractures of T3 T4 T5 which are new since 2015 but age indeterminate. Continue oxycodone when necessary for pain.     DVT Prophylaxis-  SCDs   AM Labs Ordered, also please review Full Orders  Family Communication: No family present at bedside  Code Status:  Full code  Admission status: Observation    Time spent in minutes : 60 minutes   LAMA,GAGAN S M.D on 10/16/2016 at 11:41 PM  Between 7am to 7pm - Pager - [phone]. After 7pm go to [email or web address] - password [facility]  [facility] - Office  [phone]

## 2016-10-16 NOTE — ED Triage Notes (Addendum)
Patient brought in by EMS, states she fell out of the bed and hit her head on the metal part of the bed. Patient has laceration noted to right side top of head. Bleeding controlled at this time. Denies LOC.

## 2016-10-17 ENCOUNTER — Encounter (HOSPITAL_COMMUNITY): Payer: Self-pay | Admitting: *Deleted

## 2016-10-17 DIAGNOSIS — I951 Orthostatic hypotension: Secondary | ICD-10-CM | POA: Diagnosis not present

## 2016-10-17 DIAGNOSIS — S42291A Other displaced fracture of upper end of right humerus, initial encounter for closed fracture: Secondary | ICD-10-CM | POA: Diagnosis not present

## 2016-10-17 DIAGNOSIS — W19XXXA Unspecified fall, initial encounter: Secondary | ICD-10-CM

## 2016-10-17 DIAGNOSIS — E860 Dehydration: Secondary | ICD-10-CM | POA: Diagnosis not present

## 2016-10-17 DIAGNOSIS — E871 Hypo-osmolality and hyponatremia: Secondary | ICD-10-CM | POA: Diagnosis not present

## 2016-10-17 LAB — COMPREHENSIVE METABOLIC PANEL
ALBUMIN: 3 g/dL — AB (ref 3.5–5.0)
ALK PHOS: 92 U/L (ref 38–126)
ALT: 9 U/L — ABNORMAL LOW (ref 14–54)
AST: 14 U/L — AB (ref 15–41)
Anion gap: 8 (ref 5–15)
BILIRUBIN TOTAL: 0.6 mg/dL (ref 0.3–1.2)
BUN: 10 mg/dL (ref 6–20)
CALCIUM: 7.8 mg/dL — AB (ref 8.9–10.3)
CO2: 24 mmol/L (ref 22–32)
Chloride: 93 mmol/L — ABNORMAL LOW (ref 101–111)
Creatinine, Ser: 0.99 mg/dL (ref 0.44–1.00)
GFR calc Af Amer: >60 mL/min (ref >60)
GFR calc non Af Amer: 56 mL/min — ABNORMAL LOW (ref >60)
GLUCOSE: 95 mg/dL (ref 65–99)
Potassium: 3.6 mmol/L (ref 3.5–5.1)
Sodium: 125 mmol/L — ABNORMAL LOW (ref 135–145)
TOTAL PROTEIN: 5.4 g/dL — AB (ref 6.5–8.1)

## 2016-10-17 LAB — CBC
HEMATOCRIT: 29.2 % — AB (ref 36.0–46.0)
HEMOGLOBIN: 10 g/dL — AB (ref 12.0–15.0)
MCH: 31 pg (ref 26.0–34.0)
MCHC: 34.2 g/dL (ref 30.0–36.0)
MCV: 90.4 fL (ref 78.0–100.0)
Platelets: 131 10*3/uL — ABNORMAL LOW (ref 150–400)
RBC: 3.23 MIL/uL — AB (ref 3.87–5.11)
RDW: 12.8 % (ref 11.5–15.5)
WBC: 5.5 10*3/uL (ref 4.0–10.5)

## 2016-10-17 LAB — TSH: TSH: 0.784 u[IU]/mL (ref 0.350–4.500)

## 2016-10-17 LAB — MRSA PCR SCREENING: MRSA by PCR: NEGATIVE

## 2016-10-17 LAB — OSMOLALITY: Osmolality: 264 mOsm/kg — ABNORMAL LOW (ref 275–295)

## 2016-10-17 MED ORDER — HYDROCODONE-ACETAMINOPHEN 5-325 MG PO TABS
1.0000 | ORAL_TABLET | Freq: Four times a day (QID) | ORAL | Status: DC | PRN
  Administered 2016-10-17 – 2016-10-19 (×6): 1 via ORAL
  Filled 2016-10-17 (×7): qty 1

## 2016-10-17 MED ORDER — SODIUM CHLORIDE 0.9 % IV SOLN
INTRAVENOUS | Status: DC
  Administered 2016-10-17 – 2016-10-19 (×2): via INTRAVENOUS

## 2016-10-17 MED ORDER — OXYCODONE HCL 5 MG PO TABS
5.0000 mg | ORAL_TABLET | Freq: Four times a day (QID) | ORAL | Status: DC | PRN
  Administered 2016-10-17 – 2016-10-19 (×6): 5 mg via ORAL
  Filled 2016-10-17 (×6): qty 1

## 2016-10-17 MED ORDER — LEVOTHYROXINE SODIUM 25 MCG PO TABS
25.0000 ug | ORAL_TABLET | Freq: Every day | ORAL | Status: DC
  Administered 2016-10-17 – 2016-10-19 (×3): 25 ug via ORAL
  Filled 2016-10-17 (×3): qty 1

## 2016-10-17 MED ORDER — BACITRACIN ZINC 500 UNIT/GM EX OINT
TOPICAL_OINTMENT | CUTANEOUS | Status: AC
  Filled 2016-10-17: qty 0.9

## 2016-10-17 MED ORDER — ALPRAZOLAM 0.5 MG PO TABS
1.0000 mg | ORAL_TABLET | Freq: Two times a day (BID) | ORAL | Status: DC | PRN
  Administered 2016-10-17 – 2016-10-19 (×4): 1 mg via ORAL
  Filled 2016-10-17 (×4): qty 2

## 2016-10-17 MED ORDER — PANTOPRAZOLE SODIUM 40 MG PO TBEC
40.0000 mg | DELAYED_RELEASE_TABLET | Freq: Every day | ORAL | Status: DC
  Administered 2016-10-17 – 2016-10-19 (×3): 40 mg via ORAL
  Filled 2016-10-17 (×3): qty 1

## 2016-10-17 MED ORDER — AMITRIPTYLINE HCL 50 MG PO TABS
100.0000 mg | ORAL_TABLET | Freq: Every day | ORAL | Status: DC
  Administered 2016-10-17 – 2016-10-18 (×2): 100 mg via ORAL
  Filled 2016-10-17 (×2): qty 1
  Filled 2016-10-17 (×3): qty 2
  Filled 2016-10-17 (×2): qty 4

## 2016-10-17 MED ORDER — FLUOXETINE HCL 20 MG PO CAPS
20.0000 mg | ORAL_CAPSULE | Freq: Every day | ORAL | Status: DC
  Administered 2016-10-17 – 2016-10-19 (×3): 20 mg via ORAL
  Filled 2016-10-17 (×3): qty 1

## 2016-10-17 MED ORDER — ACETAMINOPHEN 650 MG RE SUPP
650.0000 mg | Freq: Four times a day (QID) | RECTAL | Status: DC | PRN
Start: 2016-10-17 — End: 2016-10-19

## 2016-10-17 MED ORDER — DOUBLE ANTIBIOTIC 500-10000 UNIT/GM EX OINT
TOPICAL_OINTMENT | Freq: Once | CUTANEOUS | Status: AC
  Administered 2016-10-17: 1 via TOPICAL
  Filled 2016-10-17: qty 1

## 2016-10-17 MED ORDER — HYDROCODONE-ACETAMINOPHEN 5-325 MG PO TABS
1.0000 | ORAL_TABLET | Freq: Once | ORAL | Status: AC
  Administered 2016-10-17: 1 via ORAL
  Filled 2016-10-17: qty 1

## 2016-10-17 MED ORDER — ACETAMINOPHEN 325 MG PO TABS
650.0000 mg | ORAL_TABLET | Freq: Four times a day (QID) | ORAL | Status: DC | PRN
  Administered 2016-10-17: 650 mg via ORAL
  Filled 2016-10-17: qty 2

## 2016-10-17 MED ORDER — LOSARTAN POTASSIUM 50 MG PO TABS
50.0000 mg | ORAL_TABLET | Freq: Every day | ORAL | Status: DC
  Administered 2016-10-17 – 2016-10-19 (×3): 50 mg via ORAL
  Filled 2016-10-17 (×3): qty 1

## 2016-10-17 MED ORDER — ONDANSETRON HCL 4 MG/2ML IJ SOLN: 4.0000 mg | Freq: Four times a day (QID) | INTRAMUSCULAR | Status: DC | PRN

## 2016-10-17 MED ORDER — BISOPROLOL FUMARATE 5 MG PO TABS
5.0000 mg | ORAL_TABLET | Freq: Two times a day (BID) | ORAL | Status: DC
  Administered 2016-10-17 – 2016-10-19 (×5): 5 mg via ORAL
  Filled 2016-10-17 (×13): qty 1

## 2016-10-17 MED ORDER — ONDANSETRON HCL 4 MG PO TABS: 4.0000 mg | ORAL_TABLET | Freq: Four times a day (QID) | ORAL | Status: DC | PRN

## 2016-10-17 NOTE — Progress Notes (Signed)
PROGRESS NOTE  Rachel Dodson  [id number] DOB: 07/02/1946 DOA: 10/16/2016 PCP: Glo Herring., MD Outpatient Specialists:  Subjective: Has some pain in her head but denies other complaints.  Brief Narrative:  Rachel Dodson  is a 70 y.o. female, With history of hypertension, anxiety, depression came to the hospital with chief complaint of fall. Patient is having recurrent falls over the past few days she was seen yesterday by orthopedics as she sustained a proximal humerus fracture after a fall at home. She was placed in shoulder immobilizer.  Today in patient fell hitting her head, CT head showed scalp hematoma. Patient denies passing out. No dizziness. She denies nausea vomiting or diarrhea. No chest pain or shortness of breath. She denies dysuria.  Assessment & Plan:   Active Problems:   Hyponatremia   Fall  Recurrent mechanical fall -Patient is having recurrent falls at home, will place under observation, obtain PT OT evaluation. -Denies any loss of consciousness, but reported dizziness upon standing. -Evaluated by PT/OT recommended ALF versus home health with 24 supervision. -Check orthostatic vitals to rule out orthostatic hypotension.  Hyponatremia -Likely from HCTZ, sodium is 124. -Hold HCTZ and check BMP in a.m.  Right humeral fracture -Right humeral neck fracture  Scalp laceration/hematoma- staples applied in the ED.  Hypertension- continue bisoprolol 5 mg twice a day, Cozaar 50 mg daily. Hold HCTZ due to hyponatremia.  Hypothyroidism- check TSH, continue Synthroid 25 g by mouth daily  Depression- continue Prozac, Xanax 1 mg 2 times daily when necessary  Compression fractures (thoracic)- CT cervical spine showed mild age indeterminate superior endplate compression fractures of T3 T4 T5 which are new since 2015 but age indeterminate. Continue oxycodone when necessary for pain.    DVT prophylaxis: Lovenox Code Status: Full Code Family Communication:    Disposition Plan:  Diet: Diet Heart Room service appropriate? Yes; Fluid consistency: Thin; Fluid restriction: 1200 mL Fluid  Consultants:   None  Procedures:   None  Antimicrobials:   None   Objective: Vitals:   10/17/16 0453 10/17/16 0700 10/17/16 0800 10/17/16 0900  BP:  (!) 136/55 (!) 157/73 135/69  Pulse:      Resp:      Temp: 97.9 F (36.6 C)     TempSrc: Oral     SpO2:      Weight: 51.3 kg (113 lb 1.5 oz)     Height: 5\' 3"  (1.6 m)       Intake/Output Summary (Last 24 hours) at 10/17/16 1202 Last data filed at 10/17/16 0842  Gross per 24 hour  Intake              170 ml  Output                0 ml  Net              170 ml   Filed Weights   10/16/16 1738 10/17/16 0453  Weight: 59 kg (130 lb) 51.3 kg (113 lb 1.5 oz)    Examination: General exam: Appears calm and comfortable  Respiratory system: Clear to auscultation. Respiratory effort normal. Cardiovascular system: S1 & S2 heard, RRR. No JVD, murmurs, rubs, gallops or clicks. No pedal edema. Gastrointestinal system: Abdomen is nondistended, soft and nontender. No organomegaly or masses felt. Normal bowel sounds heard. Central nervous system: Alert and oriented. No focal neurological deficits. Extremities: Symmetric 5 x 5 power. Skin: No rashes, lesions or ulcers Psychiatry: Judgement and insight appear normal. Mood & affect appropriate.  Data Reviewed: I have personally reviewed following labs and imaging studies  CBC:  Recent Labs Lab 10/16/16 2117 10/17/16 0232  WBC 6.4 5.5  NEUTROABS 4.3  --   HGB 11.0* 10.0*  HCT 32.4* 29.2*  MCV 91.3 90.4  PLT 135* [id number]*   Basic Metabolic Panel:  Recent Labs Lab 10/16/16 2117 10/17/16 0232  NA 124* 125*  K 3.9 3.6  CL 91* 93*  CO2 25 24  GLUCOSE 93 95  BUN 11 10  CREATININE 1.09* 0.99  CALCIUM 8.1* 7.8*   GFR: Estimated Creatinine Clearance: 42.2 mL/min (by C-G formula based on SCr of 0.99 mg/dL). Liver Function Tests:  Recent Labs Lab  10/16/16 2117 10/17/16 0232  AST 15 14*  ALT 11* 9*  ALKPHOS 98 92  BILITOT 0.7 0.6  PROT 5.8* 5.4*  ALBUMIN 3.2* 3.0*   No results for input(s): LIPASE, AMYLASE in the last 168 hours. No results for input(s): AMMONIA in the last 168 hours. Coagulation Profile:  Recent Labs Lab 10/16/16 2117  INR 1.04   Cardiac Enzymes: No results for input(s): CKTOTAL, CKMB, CKMBINDEX, TROPONINI in the last 168 hours. BNP (last 3 results) No results for input(s): PROBNP in the last 8760 hours. HbA1C: No results for input(s): HGBA1C in the last 72 hours. CBG: No results for input(s): GLUCAP in the last 168 hours. Lipid Profile: No results for input(s): CHOL, HDL, LDLCALC, TRIG, CHOLHDL, LDLDIRECT in the last 72 hours. Thyroid Function Tests:  Recent Labs  10/17/16 0232  TSH 0.784   Anemia Panel: No results for input(s): VITAMINB12, FOLATE, FERRITIN, TIBC, IRON, RETICCTPCT in the last 72 hours. Urine analysis:    Component Value Date/Time   COLORURINE YELLOW 10/16/2016 1928   APPEARANCEUR CLEAR 10/16/2016 1928   LABSPEC 1.010 10/16/2016 1928   PHURINE 6.0 10/16/2016 1928   GLUCOSEU NEGATIVE 10/16/2016 1928   HGBUR NEGATIVE 10/16/2016 1928   BILIRUBINUR NEGATIVE 10/16/2016 1928   KETONESUR NEGATIVE 10/16/2016 1928   PROTEINUR NEGATIVE 10/16/2016 1928   UROBILINOGEN 0.2 01/29/2014 1642   NITRITE NEGATIVE 10/16/2016 1928   LEUKOCYTESUR SMALL (A) 10/16/2016 1928   Sepsis Labs: @LABRCNTIP (procalcitonin:4,lacticidven:4)  )No results found for this or any previous visit (from the past 240 hour(s)).   Invalid input(s): PROCALCITONIN, LACTICACIDVEN   Radiology Studies: Dg Chest 2 View  Result Date: 10/16/2016 CLINICAL DATA:  70 y/o F; patient fell out of bed with laceration noted to the right side of head. EXAM: CHEST  2 VIEW COMPARISON:  11/24/2015 chest CT FINDINGS: Stable cardiac silhouette within normal limits given projection and technique. Dense mitral annular  calcification. Aortic atherosclerosis with arch calcification. Large grossly stable hiatal hernia. No focal consolidation. No pneumothorax or effusion. IMPRESSION: No active cardiopulmonary disease. Large hiatal hernia. Aortic atherosclerosis. Electronically Signed   By: Kristine Garbe M.D.   On: 10/16/2016 20:15   Ct Head Wo Contrast  Result Date: 10/16/2016 CLINICAL DATA:  Status post fall out of bed today with a blow to the head. Scalp laceration. Initial encounter. EXAM: CT HEAD WITHOUT CONTRAST CT CERVICAL SPINE WITHOUT CONTRAST TECHNIQUE: Multidetector CT imaging of the head and cervical spine was performed following the standard protocol without intravenous contrast. Multiplanar CT image reconstructions of the cervical spine were also generated. COMPARISON:  Head CT scan 01/25/2014.  CT chest 03/24/2014. FINDINGS: CT HEAD FINDINGS Brain: There is some cortical atrophy and chronic microvascular ischemic change. No evidence of acute intracranial abnormality including hemorrhage, infarct, mass lesion, mass effect, midline shift or abnormal extra-axial fluid  collection is identified. No hydrocephalus or pneumocephalus. Vascular: Atherosclerosis is noted. Skull: Intact. Sinuses/Orbits: Unremarkable. Other: Large scalp hematoma on the right is identified. CT CERVICAL SPINE FINDINGS Alignment: Maintained. Skull base and vertebrae: Mild superior endplate compression fractures of T3, T4 and T5 are age indeterminate but new since the comparison chest CT. No extension into the posterior elements is identified. No cervical spine fracture is seen. Soft tissues and spinal canal: No prevertebral fluid or swelling. No visible canal hematoma. Disc levels: Multilevel loss of disc space height and endplate spurring appear worst at C4-5, C5-6 and C6-7. Upper chest: Lung apices are clear. Atherosclerotic vascular disease is noted. Other: None. IMPRESSION: Large scalp hematoma on the right without underlying  fracture or acute intracranial abnormality. Mild age-indeterminate superior endplate compression fractures of T3, T4 and T5 are new since 2015 but age indeterminate. Atrophy and chronic microvascular ischemic change. Cervical spondylosis. Electronically Signed   By: Inge Rise M.D.   On: 10/16/2016 20:33   Ct Cervical Spine Wo Contrast  Result Date: 10/16/2016 CLINICAL DATA:  Status post fall out of bed today with a blow to the head. Scalp laceration. Initial encounter. EXAM: CT HEAD WITHOUT CONTRAST CT CERVICAL SPINE WITHOUT CONTRAST TECHNIQUE: Multidetector CT imaging of the head and cervical spine was performed following the standard protocol without intravenous contrast. Multiplanar CT image reconstructions of the cervical spine were also generated. COMPARISON:  Head CT scan 01/25/2014.  CT chest 03/24/2014. FINDINGS: CT HEAD FINDINGS Brain: There is some cortical atrophy and chronic microvascular ischemic change. No evidence of acute intracranial abnormality including hemorrhage, infarct, mass lesion, mass effect, midline shift or abnormal extra-axial fluid collection is identified. No hydrocephalus or pneumocephalus. Vascular: Atherosclerosis is noted. Skull: Intact. Sinuses/Orbits: Unremarkable. Other: Large scalp hematoma on the right is identified. CT CERVICAL SPINE FINDINGS Alignment: Maintained. Skull base and vertebrae: Mild superior endplate compression fractures of T3, T4 and T5 are age indeterminate but new since the comparison chest CT. No extension into the posterior elements is identified. No cervical spine fracture is seen. Soft tissues and spinal canal: No prevertebral fluid or swelling. No visible canal hematoma. Disc levels: Multilevel loss of disc space height and endplate spurring appear worst at C4-5, C5-6 and C6-7. Upper chest: Lung apices are clear. Atherosclerotic vascular disease is noted. Other: None. IMPRESSION: Large scalp hematoma on the right without underlying fracture  or acute intracranial abnormality. Mild age-indeterminate superior endplate compression fractures of T3, T4 and T5 are new since 2015 but age indeterminate. Atrophy and chronic microvascular ischemic change. Cervical spondylosis. Electronically Signed   By: Inge Rise M.D.   On: 10/16/2016 20:33        Scheduled Meds: . amitriptyline  100 mg Oral QHS  . bisoprolol  5 mg Oral BID  . FLUoxetine  20 mg Oral Daily  . levothyroxine  25 mcg Oral QAC breakfast  . losartan  50 mg Oral Daily  . pantoprazole  40 mg Oral Daily   Continuous Infusions: . sodium chloride 10 mL/hr at 10/17/16 0227     LOS: 0 days    Time spent: 35 minutes    Marquis Down A, MD [facility] Pager [phone]  If 7PM-7AM, please contact night-coverage [email or web address] Password [email or web address] 10/17/2016, 12:02 PM
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
## 2016-10-17 NOTE — Care Management Obs Status (Signed)
[place] NOTIFICATION   Patient Details  Name: Rachel Dodson MRN: [id number] Date of Birth: 08-08-1946   Medicare Observation Status Notification Given:  Yes    Sherald Barge, RN 10/17/2016, 1:06 PM

## 2016-10-17 NOTE — Care Management Note (Signed)
Case Management Note  Patient Details  Name: Rachel Dodson MRN: [id number] Date of Birth: 1946/01/31  Subjective/Objective:                  Pt admitted after falling multiple times at home. She lives alone and has extended family who helps when needed but works and pt does not have anyone to be with her 24/7. Pt has broken arm and is in sling. PT has recommended HH PT with use of cane. Pt has cane PTA. Pt drives at baseline. CSW has discussed ALF with pt and facility coming to visit with pt today. FC working with pt to apply fo rmedicaid.   Action/Plan: Will cont to follow, pt will need [place] arranged either at ALF or at home.  Expected Discharge Date:    10/18/2016              Expected Discharge Plan:  Assisted Living / Rest Home  In-House Referral:  Clinical Social Work  Discharge planning Services  CM Consult  Status of Service:  In process, will continue to follow  Sherald Barge, RN 10/17/2016, 1:06 PM

## 2016-10-18 DIAGNOSIS — S42301A Unspecified fracture of shaft of humerus, right arm, initial encounter for closed fracture: Secondary | ICD-10-CM | POA: Diagnosis present

## 2016-10-18 DIAGNOSIS — E871 Hypo-osmolality and hyponatremia: Secondary | ICD-10-CM | POA: Diagnosis not present

## 2016-10-18 DIAGNOSIS — E860 Dehydration: Secondary | ICD-10-CM | POA: Diagnosis not present

## 2016-10-18 DIAGNOSIS — I951 Orthostatic hypotension: Secondary | ICD-10-CM | POA: Diagnosis not present

## 2016-10-18 DIAGNOSIS — S42291A Other displaced fracture of upper end of right humerus, initial encounter for closed fracture: Secondary | ICD-10-CM | POA: Diagnosis not present

## 2016-10-18 DIAGNOSIS — W19XXXA Unspecified fall, initial encounter: Secondary | ICD-10-CM | POA: Diagnosis not present

## 2016-10-18 LAB — URINE CULTURE

## 2016-10-18 MED ORDER — TUBERCULIN PPD 5 UNIT/0.1ML ID SOLN
5.0000 [IU] | Freq: Once | INTRADERMAL | Status: DC
  Administered 2016-10-18: 5 [IU] via INTRADERMAL
  Filled 2016-10-18: qty 0.1

## 2016-10-18 MED ORDER — SODIUM CHLORIDE 0.9 % IV BOLUS (SEPSIS)
1000.0000 mL | Freq: Once | INTRAVENOUS | Status: AC
  Administered 2016-10-18: 1000 mL via INTRAVENOUS

## 2016-10-18 NOTE — Progress Notes (Signed)
PROGRESS NOTE  Rachel Dodson  [id number] DOB: 01/21/1946 DOA: 10/16/2016 PCP: Glo Herring., MD Outpatient Specialists:  Subjective: Has some pain in her head but denies other complaints.  Brief Narrative:  Rachel Dodson  is a 70 y.o. female, With history of hypertension, anxiety, depression came to the hospital with chief complaint of fall. Patient is having recurrent falls over the past few days she was seen yesterday by orthopedics as she sustained a proximal humerus fracture after a fall at home. She was placed in shoulder immobilizer.  Today in patient fell hitting her head, CT head showed scalp hematoma. Patient denies passing out. No dizziness. She denies nausea vomiting or diarrhea. No chest pain or shortness of breath. She denies dysuria.  Assessment & Plan:   Principal Problem:   Fall Active Problems:   Orthostatic hypotension   Hyponatremia   Dehydration   Right humeral fracture  Recurrent mechanical fall -Patient is having recurrent falls at home, will place under observation, obtain PT OT evaluation. -Denies any loss of consciousness, but reported dizziness upon standing. -Evaluated by PT/OT recommended ALF versus home health with 24 supervision. -Check orthostatic vitals to rule out orthostatic hypotension.  Hyponatremia -Likely from HCTZ, sodium is 124. -Hold HCTZ and check BMP in a.m.  Orthostatic hypotension -Blood pressure 153/58 while she is lying down and 104/64 after getting up. -Held blood pressure medications including HCTZ and bisoprolol, given IV fluids. Check BMP in a.m.  Right humeral fracture -Right humeral neck fracture  Scalp laceration/hematoma- staples applied in the ED.  Hypertension- continue bisoprolol 5 mg twice a day, Cozaar 50 mg daily. Hold HCTZ due to hyponatremia.  Hypothyroidism- TSH is 0.784. Synthroid 25 g by mouth daily  Depression- continue Prozac, Xanax 1 mg 2 times daily when necessary  Compression fractures  (thoracic)- CT cervical spine showed mild age indeterminate superior endplate compression fractures of T3 T4 T5 which are new since 2015 but age indeterminate. Continue oxycodone when necessary for pain.    DVT prophylaxis: Lovenox Code Status: Full Code Family Communication:  Disposition Plan: To ALF in a.m., pending improvement of sodium. Diet: Diet Heart Room service appropriate? Yes; Fluid consistency: Thin; Fluid restriction: 1200 mL Fluid  Consultants:   None  Procedures:   None  Antimicrobials:   None   Objective: Vitals:   10/17/16 0900 10/17/16 1340 10/17/16 2000 10/18/16 0438  BP: 135/69 123/61 (!) 151/65 (!) 143/65  Pulse:   66 66  Resp:   20 17  Temp:   97.3 F (36.3 C) 98.4 F (36.9 C)  TempSrc:   Oral Oral  SpO2:   97% 99%  Weight:      Height:        Intake/Output Summary (Last 24 hours) at 10/18/16 1243 Last data filed at 10/18/16 0900  Gross per 24 hour  Intake              480 ml  Output                0 ml  Net              480 ml   Filed Weights   10/16/16 1738 10/17/16 0453  Weight: 59 kg (130 lb) 51.3 kg (113 lb 1.5 oz)    Examination: General exam: Appears calm and comfortable  Respiratory system: Clear to auscultation. Respiratory effort normal. Cardiovascular system: S1 & S2 heard, RRR. No JVD, murmurs, rubs, gallops or clicks. No pedal edema. Gastrointestinal system: Abdomen is nondistended,  soft and nontender. No organomegaly or masses felt. Normal bowel sounds heard. Central nervous system: Alert and oriented. No focal neurological deficits. Extremities: Symmetric 5 x 5 power. Skin: No rashes, lesions or ulcers Psychiatry: Judgement and insight appear normal. Mood & affect appropriate.   Data Reviewed: I have personally reviewed following labs and imaging studies  CBC:  Recent Labs Lab 10/16/16 2117 10/17/16 0232  WBC 6.4 5.5  NEUTROABS 4.3  --   HGB 11.0* 10.0*  HCT 32.4* 29.2*  MCV 91.3 90.4  PLT 135* 131*   Basic  Metabolic Panel:  Recent Labs Lab 10/16/16 2117 10/17/16 0232  NA 124* 125*  K 3.9 3.6  CL 91* 93*  CO2 25 24  GLUCOSE 93 95  BUN 11 10  CREATININE 1.09* 0.99  CALCIUM 8.1* 7.8*   GFR: Estimated Creatinine Clearance: 42.2 mL/min (by C-G formula based on SCr of 0.99 mg/dL). Liver Function Tests:  Recent Labs Lab 10/16/16 2117 10/17/16 0232  AST 15 14*  ALT 11* 9*  ALKPHOS 98 92  BILITOT 0.7 0.6  PROT 5.8* 5.4*  ALBUMIN 3.2* 3.0*   No results for input(s): LIPASE, AMYLASE in the last 168 hours. No results for input(s): AMMONIA in the last 168 hours. Coagulation Profile:  Recent Labs Lab 10/16/16 2117  INR 1.04   Cardiac Enzymes: No results for input(s): CKTOTAL, CKMB, CKMBINDEX, TROPONINI in the last 168 hours. BNP (last 3 results) No results for input(s): PROBNP in the last 8760 hours. HbA1C: No results for input(s): HGBA1C in the last 72 hours. CBG: No results for input(s): GLUCAP in the last 168 hours. Lipid Profile: No results for input(s): CHOL, HDL, LDLCALC, TRIG, CHOLHDL, LDLDIRECT in the last 72 hours. Thyroid Function Tests:  Recent Labs  10/17/16 0232  TSH 0.784   Anemia Panel: No results for input(s): VITAMINB12, FOLATE, FERRITIN, TIBC, IRON, RETICCTPCT in the last 72 hours. Urine analysis:    Component Value Date/Time   COLORURINE YELLOW 10/16/2016 1928   APPEARANCEUR CLEAR 10/16/2016 1928   LABSPEC 1.010 10/16/2016 1928   PHURINE 6.0 10/16/2016 1928   GLUCOSEU NEGATIVE 10/16/2016 1928   HGBUR NEGATIVE 10/16/2016 1928   BILIRUBINUR NEGATIVE 10/16/2016 1928   KETONESUR NEGATIVE 10/16/2016 1928   PROTEINUR NEGATIVE 10/16/2016 1928   UROBILINOGEN 0.2 01/29/2014 1642   NITRITE NEGATIVE 10/16/2016 1928   LEUKOCYTESUR SMALL (A) 10/16/2016 1928   Sepsis Labs: @LABRCNTIP (procalcitonin:4,lacticidven:4)  ) Recent Results (from the past 240 hour(s))  Urine culture     Status: Abnormal   Collection Time: 10/16/16  7:28 PM  Result Value  Ref Range Status   Specimen Description URINE, CLEAN CATCH  Final   Special Requests NONE  Final   Culture MULTIPLE SPECIES PRESENT, SUGGEST RECOLLECTION (A)  Final   Report Status 10/18/2016 FINAL  Final  MRSA PCR Screening     Status: None   Collection Time: 10/17/16  4:39 AM  Result Value Ref Range Status   MRSA by PCR NEGATIVE NEGATIVE Final    Comment:        The GeneXpert MRSA Assay (FDA approved for NASAL specimens only), is one component of a comprehensive MRSA colonization surveillance program. It is not intended to diagnose MRSA infection nor to guide or monitor treatment for MRSA infections.      Invalid input(s): PROCALCITONIN, LACTICACIDVEN   Radiology Studies: Dg Chest 2 View  Result Date: 10/16/2016 CLINICAL DATA:  70 y/o F; patient fell out of bed with laceration noted to the right side of  head. EXAM: CHEST  2 VIEW COMPARISON:  11/24/2015 chest CT FINDINGS: Stable cardiac silhouette within normal limits given projection and technique. Dense mitral annular calcification. Aortic atherosclerosis with arch calcification. Large grossly stable hiatal hernia. No focal consolidation. No pneumothorax or effusion. IMPRESSION: No active cardiopulmonary disease. Large hiatal hernia. Aortic atherosclerosis. Electronically Signed   By: Kristine Garbe M.D.   On: 10/16/2016 20:15   Ct Head Wo Contrast  Result Date: 10/16/2016 CLINICAL DATA:  Status post fall out of bed today with a blow to the head. Scalp laceration. Initial encounter. EXAM: CT HEAD WITHOUT CONTRAST CT CERVICAL SPINE WITHOUT CONTRAST TECHNIQUE: Multidetector CT imaging of the head and cervical spine was performed following the standard protocol without intravenous contrast. Multiplanar CT image reconstructions of the cervical spine were also generated. COMPARISON:  Head CT scan 01/25/2014.  CT chest 03/24/2014. FINDINGS: CT HEAD FINDINGS Brain: There is some cortical atrophy and chronic microvascular  ischemic change. No evidence of acute intracranial abnormality including hemorrhage, infarct, mass lesion, mass effect, midline shift or abnormal extra-axial fluid collection is identified. No hydrocephalus or pneumocephalus. Vascular: Atherosclerosis is noted. Skull: Intact. Sinuses/Orbits: Unremarkable. Other: Large scalp hematoma on the right is identified. CT CERVICAL SPINE FINDINGS Alignment: Maintained. Skull base and vertebrae: Mild superior endplate compression fractures of T3, T4 and T5 are age indeterminate but new since the comparison chest CT. No extension into the posterior elements is identified. No cervical spine fracture is seen. Soft tissues and spinal canal: No prevertebral fluid or swelling. No visible canal hematoma. Disc levels: Multilevel loss of disc space height and endplate spurring appear worst at C4-5, C5-6 and C6-7. Upper chest: Lung apices are clear. Atherosclerotic vascular disease is noted. Other: None. IMPRESSION: Large scalp hematoma on the right without underlying fracture or acute intracranial abnormality. Mild age-indeterminate superior endplate compression fractures of T3, T4 and T5 are new since 2015 but age indeterminate. Atrophy and chronic microvascular ischemic change. Cervical spondylosis. Electronically Signed   By: Inge Rise M.D.   On: 10/16/2016 20:33   Ct Cervical Spine Wo Contrast  Result Date: 10/16/2016 CLINICAL DATA:  Status post fall out of bed today with a blow to the head. Scalp laceration. Initial encounter. EXAM: CT HEAD WITHOUT CONTRAST CT CERVICAL SPINE WITHOUT CONTRAST TECHNIQUE: Multidetector CT imaging of the head and cervical spine was performed following the standard protocol without intravenous contrast. Multiplanar CT image reconstructions of the cervical spine were also generated. COMPARISON:  Head CT scan 01/25/2014.  CT chest 03/24/2014. FINDINGS: CT HEAD FINDINGS Brain: There is some cortical atrophy and chronic microvascular ischemic  change. No evidence of acute intracranial abnormality including hemorrhage, infarct, mass lesion, mass effect, midline shift or abnormal extra-axial fluid collection is identified. No hydrocephalus or pneumocephalus. Vascular: Atherosclerosis is noted. Skull: Intact. Sinuses/Orbits: Unremarkable. Other: Large scalp hematoma on the right is identified. CT CERVICAL SPINE FINDINGS Alignment: Maintained. Skull base and vertebrae: Mild superior endplate compression fractures of T3, T4 and T5 are age indeterminate but new since the comparison chest CT. No extension into the posterior elements is identified. No cervical spine fracture is seen. Soft tissues and spinal canal: No prevertebral fluid or swelling. No visible canal hematoma. Disc levels: Multilevel loss of disc space height and endplate spurring appear worst at C4-5, C5-6 and C6-7. Upper chest: Lung apices are clear. Atherosclerotic vascular disease is noted. Other: None. IMPRESSION: Large scalp hematoma on the right without underlying fracture or acute intracranial abnormality. Mild age-indeterminate superior endplate compression fractures of T3,  T4 and T5 are new since 2015 but age indeterminate. Atrophy and chronic microvascular ischemic change. Cervical spondylosis. Electronically Signed   By: Inge Rise M.D.   On: 10/16/2016 20:33        Scheduled Meds: . amitriptyline  100 mg Oral QHS  . bisoprolol  5 mg Oral BID  . FLUoxetine  20 mg Oral Daily  . levothyroxine  25 mcg Oral QAC breakfast  . losartan  50 mg Oral Daily  . pantoprazole  40 mg Oral Daily  . tuberculin  5 Units Intradermal Once   Continuous Infusions: . sodium chloride Stopped (10/17/16 0800)     LOS: 0 days    Time spent: 35 minutes    Ehren Berisha A, MD [facility] Pager [phone]  If 7PM-7AM, please contact night-coverage [email or web address] Password [facility] 10/18/2016, 12:43 PM

## 2016-10-18 NOTE — Clinical Social Work Note (Signed)
Clinical Social Work Assessment  Patient Details  Name: Rachel Dodson MRN: [id number] Date of Birth: 06-27-1946  Date of referral:  10/17/16               Reason for consult:  Discharge Planning                Permission sought to share information with:    Permission granted to share information::     Name::        Agency::     Relationship::     Contact Information:  Dorothe Pea, arrived at end of the assessment and patient requested that CSW review information discussed.   Housing/Transportation Living arrangements for the past 2 months:  [place] of Information:  Patient, Other (Comment Required) (Cousin, Deatra Canter.) Patient Interpreter Needed:  None Criminal Activity/Legal Involvement Pertinent to Current Situation/Hospitalization:  No - Comment as needed Significant Relationships:  Other Family Members, Pets Lives with:  Self, Pets Do you feel safe going back to the place where you live?  Yes Need for family participation in patient care:  Yes (Comment)  Care giving concerns: Patient lives alone, has had multiple falls.   Social Worker assessment / plan:  Patient stated that she has minimal support.  She has a walker but does not use it. Patient stated that while she wanted to go home she would consider ALF. Patient reported multiple falls. Patient verbalized sadness and disappointment regarding the thought of leaving her home and going in to placement. CSW provided supportive counseling and motivational interviewing.  Patient gave permission for CSW to send clinicals to [facility] and [place]. Trudy at [facility] advised that she would assess patient later today.   Employment status:  Retired Customer service manager for patient. ) PT Recommendations:  24 Hour Supervision (ALF/24 hour Supervision) Information / Referral to community resources:  [place] (ALF  listing)  Patient/Family's Response to care: Patient's cousin, Jeanell Sparrow, contacted Development worker, community and advised that patient was unsafe in the home. Patient is apprehensive about going to placement.   Patient/Family's Understanding of and Emotional Response to Diagnosis, Current Treatment, and Prognosis: Patient understands her diagnosis, treatmetn and prognosis.    Emotional Assessment Appearance:  Appears stated age Attitude/Demeanor/Rapport:   (Cooperative/pleasant) Affect (typically observed):  Accepting Orientation:  Oriented to Self, Oriented to Place, Oriented to  Time, Oriented to Situation Alcohol / Substance use:  Not Applicable Psych involvement (Current and /or in the community):  No (Comment)  Discharge Needs  Concerns to be addressed:  Discharge Planning Concerns Readmission within the last 30 days:  No Current discharge risk:  None Barriers to Discharge:  No Barriers Identified   Ihor Gully, LCSW 10/18/2016, 10:40 AM

## 2016-10-18 NOTE — Care Management Note (Signed)
Case Management Note  Patient Details  Name: Rachel Dodson MRN: [id number] Date of Birth: 1946-02-22  Expected Discharge Date:    10/18/2016              Expected Discharge Plan:  Assisted Living / Rest Home  In-House Referral:  Clinical Social Work  Discharge planning Services  CM Consult  Post Acute Care Choice:  Home Health Choice offered to:  Patient  DME Arranged:    DME Agency:     HH Arranged:  PT [place]:  [place]  Status of Service:  Completed, signed off   Additional Comments: Pt has been accepted by [facility] and is agreeable to ALF placement. Pt is agreeable to [facility] services at ALF, facility preference is [facility], Per Abby, with [facility], they are unable to take pt due to insurance plan. Pt's has chosen [facility] as back up provider. Romualdo Bolk, of [facility], is aware of referral and will obtain pt info from chart. Pt/facility aware that [facility] agency has 48hrs to initiate services. CSW has made arrangements for placement. No further CM needs.   Sherald Barge, RN 10/18/2016, 11:33 AM

## 2016-10-18 NOTE — NC FL2 (Deleted)
[place] LEVEL OF CARE SCREENING TOOL     IDENTIFICATION  Patient Name: Rachel Dodson Birthdate: 1946-12-01 Sex: female Admission Date (Current Location): 10/16/2016  [facility] and [place] Number:  [employer] and Address:  [place] [address], [place]      Provider Number: [id number]  Attending Physician Name and Address:  Verlee Monte, MD  Relative Name and Phone Number:       Current Level of Care: Hospital Recommended Level of Care: [place] Prior Approval Number:    Date Approved/Denied:   PASRR Number: [id number] ([id number])  Discharge Plan: Other (Comment) (ALF)    Current Diagnoses: Patient Active Problem List   Diagnosis Date Noted  . Laceration of fingers without complication [id number]  . Hypokalemia 01/28/2014  . ARF (acute renal failure) ([place]) 01/25/2014  . Rhabdomyolysis 01/25/2014  . Dehydration 01/25/2014  . Fall 01/25/2014  . Dementia 01/25/2014  . COPD  GOLD II 01/08/2014  . Hyponatremia 11/10/2013  . Hypotension, unspecified 10/23/2013  . Leukocytosis, unspecified 10/23/2013  . Acute respiratory failure ([place]) 10/23/2013  . Lung nodule 10/20/2013  . UTI (urinary tract infection) 10/20/2013    Orientation RESPIRATION BLADDER Height & Weight     Self, Time, Situation, Place  Normal Continent Weight: 113 lb 1.5 oz (51.3 kg) Height:  5\' 3"  (160 cm)  BEHAVIORAL SYMPTOMS/MOOD NEUROLOGICAL BOWEL NUTRITION STATUS      Continent Diet (Diet Heart healthy. Fluid restriction 1200 mL Fluid)  AMBULATORY STATUS COMMUNICATION OF NEEDS Skin   Supervision Verbally Other (Comment) (Laceration head right staples)                       Personal Care Assistance Level of Assistance  Bathing, Feeding, Dressing Bathing Assistance: Limited assistance Feeding assistance: Independent Dressing Assistance: Limited assistance     Functional Limitations Info  Sight, Hearing, Speech  Sight Info: Adequate Hearing Info: Adequate Speech Info: Adequate    SPECIAL CARE FACTORS FREQUENCY  PT (By licensed PT)     PT Frequency: 3x/day              Contractures Contractures Info: Not present    Additional Factors Info  Code Status, Allergies, Psychotropic Code Status Info: Full Allergies Info: Latex Psychotropic Info: Xanax, Prozac         Current Medications (10/18/2016):  This is the current hospital active medication list Current Facility-Administered Medications  Medication Dose Route Frequency Provider Last Rate Last Dose  . 0.9 %  sodium chloride infusion   Intravenous Continuous Oswald Hillock, MD   Stopped at 10/17/16 0800  . acetaminophen (TYLENOL) tablet 650 mg  650 mg Oral Q6H PRN Oswald Hillock, MD   650 mg at 10/17/16 0739   Or  . acetaminophen (TYLENOL) suppository 650 mg  650 mg Rectal Q6H PRN Oswald Hillock, MD      . ALPRAZolam Duanne Moron) tablet 1 mg  1 mg Oral BID PRN Oswald Hillock, MD   1 mg at 10/17/16 2119  . amitriptyline (ELAVIL) tablet 100 mg  100 mg Oral QHS Oswald Hillock, MD   100 mg at 10/17/16 2120  . bisoprolol (ZEBETA) tablet 5 mg  5 mg Oral BID Oswald Hillock, MD   5 mg at 10/17/16 2120  . FLUoxetine (PROZAC) capsule 20 mg  20 mg Oral Daily Oswald Hillock, MD   20 mg at 10/17/16 0844  . HYDROcodone-acetaminophen (NORCO/VICODIN) 5-325  MG per tablet 1 tablet  1 tablet Oral Q6H PRN Oswald Hillock, MD   1 tablet at 10/18/16 0831  . levothyroxine (SYNTHROID, LEVOTHROID) tablet 25 mcg  25 mcg Oral QAC breakfast Oswald Hillock, MD   25 mcg at 10/18/16 0831  . losartan (COZAAR) tablet 50 mg  50 mg Oral Daily Oswald Hillock, MD   50 mg at 10/17/16 0844  . ondansetron (ZOFRAN) tablet 4 mg  4 mg Oral Q6H PRN Oswald Hillock, MD       Or  . ondansetron (ZOFRAN) injection 4 mg  4 mg Intravenous Q6H PRN Oswald Hillock, MD      . oxyCODONE (Oxy IR/ROXICODONE) immediate release tablet 5 mg  5 mg Oral Q6H PRN Oswald Hillock, MD   5 mg at 10/18/16 0446  . pantoprazole  (PROTONIX) EC tablet 40 mg  40 mg Oral Daily Oswald Hillock, MD   40 mg at 10/17/16 [id number]     Discharge Medications: Please see discharge summary for a list of discharge medications.  Relevant Imaging Results:  Relevant Lab Results:   Additional Information SSN [id number]. PATIENT HAS A MEDICAID APPLICATION PENDING. HOSPITAL FINANCIAL COUNSELOR HAS ALREADY SUBMITTED MEDICAID APPLICATION.  Marlowe Cinquemani, Clydene Pugh, LCSW

## 2016-10-18 NOTE — Clinical Social Work Placement (Addendum)
   CLINICAL SOCIAL WORK PLACEMENT  NOTE  Date:  10/18/2016  Patient Details  Name: Rachel Dodson MRN: [id number] Date of Birth: [birth year]  Clinical Social Work is seeking post-discharge placement for this patient at the [place] level of care (*CSW will initial, date and re-position this form in  chart as items are completed):  Yes   Patient/family provided with [place] Work Department's list of facilities offering this level of care within the geographic area requested by the patient (or if unable, by the patient's family).  Yes   Patient/family informed of their freedom to choose among providers that offer the needed level of care, that participate in Medicare, Medicaid or managed care program needed by the patient, have an available bed and are willing to accept the patient.  Yes   Patient/family informed of [facility]'s ownership interest in [facility] and [facility], as well as of the fact that they are under no obligation to receive care at these facilities.  PASRR submitted to EDS on       PASRR number received on       Existing PASRR number confirmed on 10/18/16     FL2 transmitted to all facilities in geographic area requested by pt/family on 10/18/16     FL2 transmitted to all facilities within larger geographic area on       Patient informed that his/her managed care company has contracts with or will negotiate with certain facilities, including the following:            Patient/family informed of bed offers received. 10/19/2016   Patient chooses bed at     [facility]F Physician recommends and patient chooses bed at     ALF Patient to be transferred to   on  . 10/19/2016   Patient to be transferred to facility by     Facility: ALF   Patient family notified on   of transfer.  Yes patient notified.  Name of family member notified:        PHYSICIAN       Additional Comment:     _______________________________________________ Ihor Gully, LCSW 10/18/2016, 10:22 AM

## 2016-10-19 DIAGNOSIS — S42291A Other displaced fracture of upper end of right humerus, initial encounter for closed fracture: Secondary | ICD-10-CM | POA: Diagnosis not present

## 2016-10-19 DIAGNOSIS — W19XXXA Unspecified fall, initial encounter: Secondary | ICD-10-CM | POA: Diagnosis not present

## 2016-10-19 DIAGNOSIS — I951 Orthostatic hypotension: Secondary | ICD-10-CM | POA: Diagnosis not present

## 2016-10-19 DIAGNOSIS — E871 Hypo-osmolality and hyponatremia: Secondary | ICD-10-CM | POA: Diagnosis not present

## 2016-10-19 DIAGNOSIS — E860 Dehydration: Secondary | ICD-10-CM | POA: Diagnosis not present

## 2016-10-19 LAB — BASIC METABOLIC PANEL
Anion gap: 4 — ABNORMAL LOW (ref 5–15)
BUN: 7 mg/dL (ref 6–20)
CHLORIDE: 98 mmol/L — AB (ref 101–111)
CO2: 25 mmol/L (ref 22–32)
CREATININE: 0.85 mg/dL (ref 0.44–1.00)
Calcium: 8.3 mg/dL — ABNORMAL LOW (ref 8.9–10.3)
GFR calc non Af Amer: >60 mL/min (ref >60)
GLUCOSE: 98 mg/dL (ref 65–99)
Potassium: 3.8 mmol/L (ref 3.5–5.1)
Sodium: 127 mmol/L — ABNORMAL LOW (ref 135–145)

## 2016-10-19 MED ORDER — HYDROCODONE-ACETAMINOPHEN 5-325 MG PO TABS: 1.0000 | ORAL_TABLET | Freq: Four times a day (QID) | ORAL | 0 refills | Status: AC | PRN

## 2016-10-19 MED ORDER — PANTOPRAZOLE SODIUM 40 MG PO TBEC: 40.0000 mg | DELAYED_RELEASE_TABLET | Freq: Every day | ORAL | 0 refills | Status: AC | PRN

## 2016-10-19 MED ORDER — FLUOXETINE HCL 20 MG PO CAPS: 20.0000 mg | ORAL_CAPSULE | Freq: Every day | ORAL | 0 refills | Status: DC

## 2016-10-19 MED ORDER — LEVOTHYROXINE SODIUM 25 MCG PO TABS: 25.0000 ug | ORAL_TABLET | Freq: Every day | ORAL | 0 refills | Status: AC

## 2016-10-19 MED ORDER — ALPRAZOLAM 1 MG PO TABS: 1.0000 mg | ORAL_TABLET | Freq: Two times a day (BID) | ORAL | 0 refills | Status: DC | PRN

## 2016-10-19 MED ORDER — AMITRIPTYLINE HCL 100 MG PO TABS: 100.0000 mg | ORAL_TABLET | Freq: Every day | ORAL | 0 refills | Status: AC

## 2016-10-19 MED ORDER — LOSARTAN POTASSIUM 50 MG PO TABS: 50.0000 mg | ORAL_TABLET | Freq: Every day | ORAL | 0 refills | Status: AC

## 2016-10-19 NOTE — Discharge Summary (Signed)
Physician Discharge Summary  Rachel Dodson [id number] DOB: Jan 03, 1946 DOA: 10/16/2016  PCP: Glo Herring., MD  Admit date: 10/16/2016 Discharge date: 10/19/2016  Admitted From: Home Disposition: ALF  Recommendations for Outpatient Follow-up:  1. Follow up with PCP in 1-2 weeks 2. Please obtain BMP/CBC in one week  Home Health: NA Equipment/Devices: NA  Discharge Condition: Stable CODE STATUS: Full Diet recommendation: Heart Healthy  Brief/Interim Summary: Rachel Dodson  is a 70 y.o. female, With history of hypertension, anxiety, depression came to the hospital with chief complaint of fall. Patient is having recurrent falls over the past few days she was seen yesterday by orthopedics as she sustained a proximal humerus fracture after a fall at home. She was placed in shoulder immobilizer.  Today in patient fell hitting her head, CT head showed scalp hematoma. Patient denies passing out. No dizziness. She denies nausea vomiting or diarrhea. No chest pain or shortness of breath. She denies dysuria.   Discharge Diagnoses:  Principal Problem:   Fall Active Problems:   Orthostatic hypotension   Hyponatremia   Dehydration   Right humeral fracture   Recurrent falls -Patient is having recurrent falls at home, placed under observation. -Denies any loss of consciousness, but reported dizziness upon standing. -Evaluated by PT/OT recommended ALF versus home health with 24 supervision. -Orthostatic vitals showed orthostatic hypotension, bisoprolol/HCTZ discontinued. -On high-dose of OxyIR 20 mg as needed, discontinued as this might contribute to falls.  Hyponatremia, chronic -Likely from HCTZ, sodium is 124. -Chronic hyponatremia sodium on discharge is 127, HCTZ discontinued.  Orthostatic hypotension -Blood pressure 153/58 while she is lying down and 104/64 after getting up. -Held blood pressure medications including HCTZ and bisoprolol, given IV fluids. Check BMP in  a.m.  Right humeral fracture -Right humeral neck fracture, on sling, follow-up with orthopedics as outpatient.  Scalp laceration/hematoma- staples applied in the ED.  Hypertension- continue bisoprolol 5 mg twice a day, Cozaar 50 mg daily. Hold HCTZ due to hyponatremia.  Hypothyroidism- TSH is 0.784. Synthroid 25 g by mouth daily  Depression- continue Prozac, Xanax 1 mg 2 times daily when necessary  Compression fractures (thoracic)- CT cervical spine showed mild age indeterminate superior endplate compression fractures of T3 T4 T5 which are new since 2015 but age indeterminate. Continue Norco.  Discharge Instructions  Discharge Instructions    Diet - low sodium heart healthy    Complete by:  As directed    Increase activity slowly    Complete by:  As directed        Medication List    STOP taking these medications   bisoprolol-hydrochlorothiazide 5-6.25 MG tablet Commonly known as:  ZIAC   oxyCODONE 15 MG immediate release tablet Commonly known as:  ROXICODONE     TAKE these medications   ALPRAZolam 1 MG tablet Commonly known as:  XANAX Take 1 tablet (1 mg total) by mouth 2 (two) times daily as needed for anxiety.   amitriptyline 100 MG tablet Commonly known as:  ELAVIL Take 100 mg by mouth at bedtime.   FLUoxetine 20 MG capsule Commonly known as:  PROZAC Take 20 mg by mouth daily.   HYDROcodone-acetaminophen 5-325 MG tablet Commonly known as:  NORCO/VICODIN Take 1 tablet by mouth every 6 (six) hours as needed for severe pain.   levothyroxine 25 MCG tablet Commonly known as:  SYNTHROID, LEVOTHROID Take 25 mcg by mouth daily before breakfast.   losartan 50 MG tablet Commonly known as:  COZAAR Take 50 mg by mouth daily.  pantoprazole 40 MG tablet Commonly known as:  PROTONIX Take 40 mg by mouth daily as needed (for acid reflux).       Contact information for follow-up providers    [facility] .   Contact information: [address] [place] [address] [phone]            Contact information for after-discharge care    Destination    [facility] .   Specialty:  Assisted Living Facility Contact information: [place] [address] [phone]                 Allergies  Allergen Reactions  . Latex     Consultations:  None   Procedures/Studies: Dg Chest 2 View  Result Date: 10/16/2016 CLINICAL DATA:  70 y/o F; patient fell out of bed with laceration noted to the right side of head. EXAM: CHEST  2 VIEW COMPARISON:  11/24/2015 chest CT FINDINGS: Stable cardiac silhouette within normal limits given projection and technique. Dense mitral annular calcification. Aortic atherosclerosis with arch calcification. Large grossly stable hiatal hernia. No focal consolidation. No pneumothorax or effusion. IMPRESSION: No active cardiopulmonary disease. Large hiatal hernia. Aortic atherosclerosis. Electronically Signed   By: Kristine Garbe M.D.   On: 10/16/2016 20:15   Dg Shoulder Right  Result Date: 10/13/2016 CLINICAL DATA:  Fall today on enclosed porch, right shoulder pain EXAM: RIGHT SHOULDER - 2+ VIEW COMPARISON:  None. FINDINGS: There is an acute fracture of the right humeral neck, with impaction. The humeral head is slightly inferiorly subluxed relative to the glenoid fossa. The scapula appears intact. Suspect right pleural effusion and possible right basilar lung opacity. IMPRESSION: 1. Right humeral neck fracture. 2. Slight inferior subluxation of the humeral head. 3. Right lung opacity warranting further evaluation with chest x-ray. Electronically Signed   By: Nolon Nations M.D.   On: 10/13/2016 18:44   Ct Head Wo Contrast  Result Date: 10/16/2016 CLINICAL DATA:  Status post fall out of bed today with a blow to the head. Scalp laceration. Initial encounter. EXAM: CT HEAD WITHOUT CONTRAST CT CERVICAL SPINE WITHOUT CONTRAST  TECHNIQUE: Multidetector CT imaging of the head and cervical spine was performed following the standard protocol without intravenous contrast. Multiplanar CT image reconstructions of the cervical spine were also generated. COMPARISON:  Head CT scan 01/25/2014.  CT chest 03/24/2014. FINDINGS: CT HEAD FINDINGS Brain: There is some cortical atrophy and chronic microvascular ischemic change. No evidence of acute intracranial abnormality including hemorrhage, infarct, mass lesion, mass effect, midline shift or abnormal extra-axial fluid collection is identified. No hydrocephalus or pneumocephalus. Vascular: Atherosclerosis is noted. Skull: Intact. Sinuses/Orbits: Unremarkable. Other: Large scalp hematoma on the right is identified. CT CERVICAL SPINE FINDINGS Alignment: Maintained. Skull base and vertebrae: Mild superior endplate compression fractures of T3, T4 and T5 are age indeterminate but new since the comparison chest CT. No extension into the posterior elements is identified. No cervical spine fracture is seen. Soft tissues and spinal canal: No prevertebral fluid or swelling. No visible canal hematoma. Disc levels: Multilevel loss of disc space height and endplate spurring appear worst at C4-5, C5-6 and C6-7. Upper chest: Lung apices are clear. Atherosclerotic vascular disease is noted. Other: None. IMPRESSION: Large scalp hematoma on the right without underlying fracture or acute intracranial abnormality. Mild age-indeterminate superior endplate compression fractures of T3, T4 and T5 are new since 2015 but age indeterminate. Atrophy and chronic microvascular ischemic change. Cervical  spondylosis. Electronically Signed   By: Inge Rise M.D.   On: 10/16/2016 20:33   Ct Cervical Spine Wo Contrast  Result Date: 10/16/2016 CLINICAL DATA:  Status post fall out of bed today with a blow to the head. Scalp laceration. Initial encounter. EXAM: CT HEAD WITHOUT CONTRAST CT CERVICAL SPINE WITHOUT CONTRAST TECHNIQUE:  Multidetector CT imaging of the head and cervical spine was performed following the standard protocol without intravenous contrast. Multiplanar CT image reconstructions of the cervical spine were also generated. COMPARISON:  Head CT scan 01/25/2014.  CT chest 03/24/2014. FINDINGS: CT HEAD FINDINGS Brain: There is some cortical atrophy and chronic microvascular ischemic change. No evidence of acute intracranial abnormality including hemorrhage, infarct, mass lesion, mass effect, midline shift or abnormal extra-axial fluid collection is identified. No hydrocephalus or pneumocephalus. Vascular: Atherosclerosis is noted. Skull: Intact. Sinuses/Orbits: Unremarkable. Other: Large scalp hematoma on the right is identified. CT CERVICAL SPINE FINDINGS Alignment: Maintained. Skull base and vertebrae: Mild superior endplate compression fractures of T3, T4 and T5 are age indeterminate but new since the comparison chest CT. No extension into the posterior elements is identified. No cervical spine fracture is seen. Soft tissues and spinal canal: No prevertebral fluid or swelling. No visible canal hematoma. Disc levels: Multilevel loss of disc space height and endplate spurring appear worst at C4-5, C5-6 and C6-7. Upper chest: Lung apices are clear. Atherosclerotic vascular disease is noted. Other: None. IMPRESSION: Large scalp hematoma on the right without underlying fracture or acute intracranial abnormality. Mild age-indeterminate superior endplate compression fractures of T3, T4 and T5 are new since 2015 but age indeterminate. Atrophy and chronic microvascular ischemic change. Cervical spondylosis. Electronically Signed   By: Inge Rise M.D.   On: 10/16/2016 20:33    (Echo, Carotid, EGD, Colonoscopy, ERCP)    Subjective:   Discharge Exam: Vitals:   10/19/16 0730 10/19/16 1350  BP: (!) 144/72 (!) 155/60  Pulse:  70  Resp: 15 16  Temp:  98 F (36.7 C)   Vitals:   10/18/16 2235 10/19/16 0400 10/19/16 0730  10/19/16 1350  BP: (!) 159/73  (!) 144/72 (!) 155/60  Pulse: 68   70  Resp:   15 16  Temp: 97.7 F (36.5 C) 97.8 F (36.6 C)  98 F (36.7 C)  TempSrc: Oral Oral  Oral  SpO2:      Weight:  52 kg (114 lb 10.2 oz)    Height:        General: Pt is alert, awake, not in acute distress Cardiovascular: RRR, S1/S2 +, no rubs, no gallops Respiratory: CTA bilaterally, no wheezing, no rhonchi Abdominal: Soft, NT, ND, bowel sounds + Extremities: no edema, no cyanosis    The results of significant diagnostics from this hospitalization (including imaging, microbiology, ancillary and laboratory) are listed below for reference.     Microbiology: Recent Results (from the past 240 hour(s))  Urine culture     Status: Abnormal   Collection Time: 10/16/16  7:28 PM  Result Value Ref Range Status   Specimen Description URINE, CLEAN CATCH  Final   Special Requests NONE  Final   Culture MULTIPLE SPECIES PRESENT, SUGGEST RECOLLECTION (A)  Final   Report Status 10/18/2016 FINAL  Final  MRSA PCR Screening     Status: None   Collection Time: 10/17/16  4:39 AM  Result Value Ref Range Status   MRSA by PCR NEGATIVE NEGATIVE Final    Comment:        The GeneXpert MRSA Assay (FDA  approved for NASAL specimens only), is one component of a comprehensive MRSA colonization surveillance program. It is not intended to diagnose MRSA infection nor to guide or monitor treatment for MRSA infections.      Labs: BNP (last 3 results) No results for input(s): BNP in the last 8760 hours. Basic Metabolic Panel:  Recent Labs Lab 10/16/16 2117 10/17/16 0232 10/19/16 0258  NA 124* 125* 127*  K 3.9 3.6 3.8  CL 91* 93* 98*  CO2 25 24 25   GLUCOSE 93 95 98  BUN 11 10 7   CREATININE 1.09* 0.99 0.85  CALCIUM 8.1* 7.8* 8.3*   Liver Function Tests:  Recent Labs Lab 10/16/16 2117 10/17/16 0232  AST 15 14*  ALT 11* 9*  ALKPHOS 98 92  BILITOT 0.7 0.6  PROT 5.8* 5.4*  ALBUMIN 3.2* 3.0*   No results  for input(s): LIPASE, AMYLASE in the last 168 hours. No results for input(s): AMMONIA in the last 168 hours. CBC:  Recent Labs Lab 10/16/16 2117 10/17/16 0232  WBC 6.4 5.5  NEUTROABS 4.3  --   HGB 11.0* 10.0*  HCT 32.4* 29.2*  MCV 91.3 90.4  PLT 135* 131*   Cardiac Enzymes: No results for input(s): CKTOTAL, CKMB, CKMBINDEX, TROPONINI in the last 168 hours. BNP: Invalid input(s): POCBNP CBG: No results for input(s): GLUCAP in the last 168 hours. D-Dimer No results for input(s): DDIMER in the last 72 hours. Hgb A1c No results for input(s): HGBA1C in the last 72 hours. Lipid Profile No results for input(s): CHOL, HDL, LDLCALC, TRIG, CHOLHDL, LDLDIRECT in the last 72 hours. Thyroid function studies  Recent Labs  10/17/16 0232  TSH 0.784   Anemia work up No results for input(s): VITAMINB12, FOLATE, FERRITIN, TIBC, IRON, RETICCTPCT in the last 72 hours. Urinalysis    Component Value Date/Time   COLORURINE YELLOW 10/16/2016 1928   APPEARANCEUR CLEAR 10/16/2016 1928   LABSPEC 1.010 10/16/2016 1928   PHURINE 6.0 10/16/2016 1928   GLUCOSEU NEGATIVE 10/16/2016 1928   HGBUR NEGATIVE 10/16/2016 1928   BILIRUBINUR NEGATIVE 10/16/2016 1928   KETONESUR NEGATIVE 10/16/2016 1928   PROTEINUR NEGATIVE 10/16/2016 1928   UROBILINOGEN 0.2 01/29/2014 1642   NITRITE NEGATIVE 10/16/2016 1928   LEUKOCYTESUR SMALL (A) 10/16/2016 1928   Sepsis Labs Invalid input(s): PROCALCITONIN,  WBC,  LACTICIDVEN Microbiology Recent Results (from the past 240 hour(s))  Urine culture     Status: Abnormal   Collection Time: 10/16/16  7:28 PM  Result Value Ref Range Status   Specimen Description URINE, CLEAN CATCH  Final   Special Requests NONE  Final   Culture MULTIPLE SPECIES PRESENT, SUGGEST RECOLLECTION (A)  Final   Report Status 10/18/2016 FINAL  Final  MRSA PCR Screening     Status: None   Collection Time: 10/17/16  4:39 AM  Result Value Ref Range Status   MRSA by PCR NEGATIVE NEGATIVE  Final    Comment:        The GeneXpert MRSA Assay (FDA approved for NASAL specimens only), is one component of a comprehensive MRSA colonization surveillance program. It is not intended to diagnose MRSA infection nor to guide or monitor treatment for MRSA infections.      Time coordinating discharge: Over 30 minutes  SIGNED:   Birdie Hopes, MD  [facility] 10/19/2016, 2:15 PM Pager   If 7PM-7AM, please contact night-coverage [email or web address] Password [email or web address]

## 2016-10-19 NOTE — NC FL2 (Deleted)
[place] LEVEL OF CARE SCREENING TOOL     IDENTIFICATION  Patient Name: Rachel Dodson Birthdate: 1946/02/08 Sex: female Admission Date (Current Location): 10/16/2016  [facility] and [place] Number:  [employer] and Address:  [place] [address], [place]      Provider Number: [id number]  Attending Physician Name and Address:  Verlee Monte, MD  Relative Name and Phone Number:       Current Level of Care: Hospital Recommended Level of Care: [place] Prior Approval Number:    Date Approved/Denied:   PASRR Number: [id number] ([id number])  Discharge Plan: Other (Comment) (ALF)    Current Diagnoses: Patient Active Problem List   Diagnosis Date Noted  . Right humeral fracture 10/18/2016  . Laceration of fingers without complication 123456  . Hypokalemia 01/28/2014  . ARF (acute renal failure) ([place]) 01/25/2014  . Rhabdomyolysis 01/25/2014  . Dehydration 01/25/2014  . Fall 01/25/2014  . Dementia 01/25/2014  . COPD  GOLD II 01/08/2014  . Hyponatremia 11/10/2013  . Orthostatic hypotension 10/23/2013  . Leukocytosis, unspecified 10/23/2013  . Acute respiratory failure ([place]) 10/23/2013  . Lung nodule 10/20/2013  . UTI (urinary tract infection) 10/20/2013    Orientation RESPIRATION BLADDER Height & Weight     Self, Time, Situation, Place  Normal Continent Weight: 114 lb 10.2 oz (52 kg) Height:  5\' 3"  (160 cm)  BEHAVIORAL SYMPTOMS/MOOD NEUROLOGICAL BOWEL NUTRITION STATUS      Continent Diet (Diet Heart healthy. Fluid restriction 1200 mL Fluid)  AMBULATORY STATUS COMMUNICATION OF NEEDS Skin   Supervision Verbally Other (Comment) (Laceration head right staples)                       Personal Care Assistance Level of Assistance  Bathing, Feeding, Dressing Bathing Assistance: Limited assistance Feeding assistance: Independent Dressing Assistance: Limited assistance     Functional  Limitations Info  Sight, Hearing, Speech Sight Info: Adequate Hearing Info: Adequate Speech Info: Adequate    SPECIAL CARE FACTORS FREQUENCY  PT (By licensed PT)     PT Frequency: 3x/day              Contractures Contractures Info: Not present    Additional Factors Info  Code Status, Allergies, Psychotropic Code Status Info: Full Allergies Info: Latex Psychotropic Info: Xanax, Prozac         Current Medications (10/19/2016):  This is the current hospital active medication list Current Facility-Administered Medications  Medication Dose Route Frequency Provider Last Rate Last Dose  . 0.9 %  sodium chloride infusion   Intravenous Continuous Verlee Monte, MD 75 mL/hr at 10/19/16 0316    . acetaminophen (TYLENOL) tablet 650 mg  650 mg Oral Q6H PRN Oswald Hillock, MD   650 mg at 10/17/16 0739   Or  . acetaminophen (TYLENOL) suppository 650 mg  650 mg Rectal Q6H PRN Oswald Hillock, MD      . ALPRAZolam Duanne Moron) tablet 1 mg  1 mg Oral BID PRN Oswald Hillock, MD   1 mg at 10/18/16 2143  . amitriptyline (ELAVIL) tablet 100 mg  100 mg Oral QHS Oswald Hillock, MD   100 mg at 10/18/16 2142  . bisoprolol (ZEBETA) tablet 5 mg  5 mg Oral BID Oswald Hillock, MD   5 mg at 10/19/16 1050  . FLUoxetine (PROZAC) capsule 20 mg  20 mg Oral Daily Oswald Hillock, MD   20 mg at 10/19/16  [id number]  . HYDROcodone-acetaminophen (NORCO/VICODIN) 5-325 MG per tablet 1 tablet  1 tablet Oral Q6H PRN Oswald Hillock, MD   1 tablet at 10/19/16 0218  . levothyroxine (SYNTHROID, LEVOTHROID) tablet 25 mcg  25 mcg Oral QAC breakfast Oswald Hillock, MD   25 mcg at 10/19/16 0744  . losartan (COZAAR) tablet 50 mg  50 mg Oral Daily Oswald Hillock, MD   50 mg at 10/19/16 0906  . ondansetron (ZOFRAN) tablet 4 mg  4 mg Oral Q6H PRN Oswald Hillock, MD       Or  . ondansetron (ZOFRAN) injection 4 mg  4 mg Intravenous Q6H PRN Oswald Hillock, MD      . oxyCODONE (Oxy IR/ROXICODONE) immediate release tablet 5 mg  5 mg Oral Q6H PRN Oswald Hillock, MD   5  mg at 10/19/16 0744  . pantoprazole (PROTONIX) EC tablet 40 mg  40 mg Oral Daily Oswald Hillock, MD   40 mg at 10/19/16 0906  . tuberculin injection 5 Units  5 Units Intradermal Once Verlee Monte, MD   5 Units at 10/18/16 1506     Discharge Medications: TAKE these medications   alendronate 70 MG tablet Commonly known as:  FOSAMAX Take 1 tablet (70 mg total) by mouth once a week. What changed:  when to take this  amLODipine 5 MG tablet Commonly known as:  NORVASC Take 1 tablet (5 mg total) by mouth daily.  aspirin EC 81 MG tablet Take 1 tablet (81 mg total) by mouth daily.  ciprofloxacin 500 MG tablet Commonly known as:  CIPRO Take 1 tablet (500 mg total) by mouth 2 (two) times daily.  ferrous fumarate 325 (106 Fe) MG Tabs tablet Commonly known as:  HEMOCYTE - 106 mg FE Take 1 tablet (106 mg of iron total) by mouth every evening.  meclizine 12.5 MG tablet Commonly known as:  ANTIVERT Take 1 tablet (12.5 mg total) by mouth 4 (four) times daily as needed. For dizziness What changed:  reasons to take this  additional instructions  metroNIDAZOLE 500 MG tablet Commonly known as:  FLAGYL Take 1 tablet (500 mg total) by mouth 3 (three) times daily.  polyethylene glycol packet Commonly known as:  MIRALAX / GLYCOLAX Take 17 g by mouth daily. What changed:  when to take this  reasons to take this  senna-docusate 8.6-50 MG tablet Commonly known as:  Senokot-S Take 1 tablet by mouth at bedtime. What changed:  when to take this  reasons to take this  traMADol 50 MG tablet Commonly known as:  ULTRAM Take 1 tablet (50 mg total) by mouth 2 (two) times daily as needed for moderate pain or severe pain.  zolpidem 10 MG tablet Commonly known as:  AMBIEN Take 10 mg by mouth at bedtime     Relevant Imaging Results:  Relevant Lab Results:   Additional Information TB TEST ADMINISTERED TO LEFT ARM ON 10/18/16.  Ebone Alcivar, Clydene Pugh, LCSW

## 2016-10-19 NOTE — Discharge Instructions (Signed)
Fall Prevention in the Home  Falls can cause injuries. They can happen to people of all ages. There are many things you can do to make your home safe and to help prevent falls.  WHAT CAN I DO ON THE OUTSIDE OF MY HOME?  Regularly fix the edges of walkways and driveways and fix any cracks.  Remove anything that might make you trip as you walk through a door, such as a raised step or threshold.  Trim any bushes or trees on the path to your home.  Use bright outdoor lighting.  Clear any walking paths of anything that might make someone trip, such as rocks or tools.  Regularly check to see if handrails are loose or broken. Make sure that both sides of any steps have handrails.  Any raised decks and porches should have guardrails on the edges.  Have any leaves, snow, or ice cleared regularly.  Use sand or salt on walking paths during winter.  Clean up any spills in your garage right away. This includes oil or grease spills. WHAT CAN I DO IN THE BATHROOM?   Use night lights.  Install grab bars by the toilet and in the tub and shower. Do not use towel bars as grab bars.  Use non-skid mats or decals in the tub or shower.  If you need to sit down in the shower, use a plastic, non-slip stool.  Keep the floor dry. Clean up any water that spills on the floor as soon as it happens.  Remove soap buildup in the tub or shower regularly.  Attach bath mats securely with double-sided non-slip rug tape.  Do not have throw rugs and other things on the floor that can make you trip. WHAT CAN I DO IN THE BEDROOM?  Use night lights.  Make sure that you have a light by your bed that is easy to reach.  Do not use any sheets or blankets that are too big for your bed. They should not hang down onto the floor.  Have a firm chair that has side arms. You can use this for support while you get dressed.  Do not have throw rugs and other things on the floor that can make you trip. WHAT CAN I DO IN  THE KITCHEN?  Clean up any spills right away.  Avoid walking on wet floors.  Keep items that you use a lot in easy-to-reach places.  If you need to reach something above you, use a [employer] step stool that has a grab bar.  Keep electrical cords out of the way.  Do not use floor polish or wax that makes floors slippery. If you must use wax, use non-skid floor wax.  Do not have throw rugs and other things on the floor that can make you trip. WHAT CAN I DO WITH MY STAIRS?  Do not leave any items on the stairs.  Make sure that there are handrails on both sides of the stairs and use them. Fix handrails that are broken or loose. Make sure that handrails are as long as the stairways.  Check any carpeting to make sure that it is firmly attached to the stairs. Fix any carpet that is loose or worn.  Avoid having throw rugs at the top or bottom of the stairs. If you do have throw rugs, attach them to the floor with carpet tape.  Make sure that you have a light switch at the top of the stairs and the bottom of the stairs.  If you do not have them, ask someone to add them for you. WHAT ELSE CAN I DO TO HELP PREVENT FALLS?  Wear shoes that:  Do not have high heels.  Have rubber bottoms.  Are comfortable and fit you well.  Are closed at the toe. Do not wear sandals.  If you use a stepladder:  Make sure that it is fully opened. Do not climb a closed stepladder.  Make sure that both sides of the stepladder are locked into place.  Ask someone to hold it for you, if possible.  Clearly mark and make sure that you can see:  Any grab bars or handrails.  First and last steps.  Where the edge of each step is.  Use tools that help you move around (mobility aids) if they are needed. These include:  Canes.  Walkers.  Scooters.  Crutches.  Turn on the lights when you go into a dark area. Replace any light bulbs as soon as they burn out.  Set up your furniture so you have a clear  path. Avoid moving your furniture around.  If any of your floors are uneven, fix them.  If there are any pets around you, be aware of where they are.  Review your medicines with your doctor. Some medicines can make you feel dizzy. This can increase your chance of falling. Ask your doctor what other things that you can do to help prevent falls.   This information is not intended to replace advice given to you by your health care provider. Make sure you discuss any questions you have with your health care provider.   Document Released: 10/06/2009 Document Revised: 04/26/2015 Document Reviewed: 01/14/2015 Elsevier Interactive Patient Education 2016 Elsevier Inc.   Hyponatremia Hyponatremia is when the amount of salt (sodium) in your blood is too low. When salt levels are low, your cells absorb extra water and they swell. The swelling happens throughout the body, but it mostly affects the brain.  HOME CARE  Take medicines only as told by your doctor. Many medicines can make this condition worse. Talk with your doctor about any medicines that you are currently taking.  Carefully follow a recommended diet as told by your doctor.  Carefully follow instructions from your doctor about fluid restrictions.  Keep all follow-up visits as told by your doctor. This is important.  Do not drink alcohol. GET HELP IF:  You feel sicker to your stomach (nauseous).  You feel more confused.  You feel more tired (fatigued).  Your headache gets worse.  You feel weaker.  Your symptoms go away and then they come back.  You have trouble following the diet instructions. GET HELP RIGHT AWAY IF:  You start to twitch and shake (have a seizure).  You pass out (faint).  You keep having watery poop (diarrhea).  You keep throwing up (vomiting).   This information is not intended to replace advice given to you by your health care provider. Make sure you discuss any questions you have with your  health care provider.   Document Released: 08/22/2011 Document Revised: 04/26/2015 Document Reviewed: 12/06/2014 Elsevier Interactive Patient Education [employer].

## 2016-10-19 NOTE — NC FL2 (Signed)
[place] LEVEL OF CARE SCREENING TOOL     IDENTIFICATION  Patient Name: Rachel Dodson Birthdate: December 04, 1946 Sex: female Admission Date (Current Location): 10/16/2016  [facility] and [place] Number:  [employer] and Address:  [place] [address], [place]      Provider Number: [id number]  Attending Physician Name and Address:  Verlee Monte, MD  Relative Name and Phone Number:       Current Level of Care: Hospital Recommended Level of Care: [place] Prior Approval Number:    Date Approved/Denied:   PASRR Number: [id number] ([id number])  Discharge Plan: Other (Comment) (ALF)    Current Diagnoses: Patient Active Problem List   Diagnosis Date Noted  . Right humeral fracture 10/18/2016  . Laceration of fingers without complication 123456  . Hypokalemia 01/28/2014  . ARF (acute renal failure) ([place]) 01/25/2014  . Rhabdomyolysis 01/25/2014  . Dehydration 01/25/2014  . Fall 01/25/2014  . Dementia 01/25/2014  . COPD  GOLD II 01/08/2014  . Hyponatremia 11/10/2013  . Orthostatic hypotension 10/23/2013  . Leukocytosis, unspecified 10/23/2013  . Acute respiratory failure ([place]) 10/23/2013  . Lung nodule 10/20/2013  . UTI (urinary tract infection) 10/20/2013    Orientation RESPIRATION BLADDER Height & Weight     Self, Time, Situation, Place  Normal Continent Weight: 114 lb 10.2 oz (52 kg) Height:  5\' 3"  (160 cm)  BEHAVIORAL SYMPTOMS/MOOD NEUROLOGICAL BOWEL NUTRITION STATUS      Continent Diet (Diet Heart healthy. Fluid restriction 1200 mL Fluid)  AMBULATORY STATUS COMMUNICATION OF NEEDS Skin   Supervision Verbally Other (Comment) (Laceration head right staples)                       Personal Care Assistance Level of Assistance  Bathing, Feeding, Dressing Bathing Assistance: Limited assistance Feeding assistance: Independent Dressing Assistance: Limited assistance     Functional  Limitations Info  Sight, Hearing, Speech Sight Info: Adequate Hearing Info: Adequate Speech Info: Adequate    SPECIAL CARE FACTORS FREQUENCY  PT (By licensed PT)     PT Frequency: 3x/day  Home health at ALF              Contractures Contractures Info: Not present    Additional Factors Info  Code Status, Allergies, Psychotropic Code Status Info: Full Allergies Info: Latex Psychotropic Info: Xanax, Prozac         Current Medications (10/19/2016):  This is the current hospital active medication list Current Facility-Administered Medications  Medication Dose Route Frequency Provider Last Rate Last Dose  . 0.9 %  sodium chloride infusion   Intravenous Continuous Verlee Monte, MD 75 mL/hr at 10/19/16 0316    . acetaminophen (TYLENOL) tablet 650 mg  650 mg Oral Q6H PRN Oswald Hillock, MD   650 mg at 10/17/16 0739   Or  . acetaminophen (TYLENOL) suppository 650 mg  650 mg Rectal Q6H PRN Oswald Hillock, MD      . ALPRAZolam Duanne Moron) tablet 1 mg  1 mg Oral BID PRN Oswald Hillock, MD   1 mg at 10/18/16 2143  . amitriptyline (ELAVIL) tablet 100 mg  100 mg Oral QHS Oswald Hillock, MD   100 mg at 10/18/16 2142  . bisoprolol (ZEBETA) tablet 5 mg  5 mg Oral BID Oswald Hillock, MD   5 mg at 10/19/16 1050  . FLUoxetine (PROZAC) capsule 20 mg  20 mg Oral Daily Oswald Hillock, MD  20 mg at 10/19/16 0906  . HYDROcodone-acetaminophen (NORCO/VICODIN) 5-325 MG per tablet 1 tablet  1 tablet Oral Q6H PRN Oswald Hillock, MD   1 tablet at 10/19/16 0218  . levothyroxine (SYNTHROID, LEVOTHROID) tablet 25 mcg  25 mcg Oral QAC breakfast Oswald Hillock, MD   25 mcg at 10/19/16 0744  . losartan (COZAAR) tablet 50 mg  50 mg Oral Daily Oswald Hillock, MD   50 mg at 10/19/16 0906  . ondansetron (ZOFRAN) tablet 4 mg  4 mg Oral Q6H PRN Oswald Hillock, MD       Or  . ondansetron (ZOFRAN) injection 4 mg  4 mg Intravenous Q6H PRN Oswald Hillock, MD      . oxyCODONE (Oxy IR/ROXICODONE) immediate release tablet 5 mg  5 mg Oral Q6H PRN  Oswald Hillock, MD   5 mg at 10/19/16 0744  . pantoprazole (PROTONIX) EC tablet 40 mg  40 mg Oral Daily Oswald Hillock, MD   40 mg at 10/19/16 0906  . tuberculin injection 5 Units  5 Units Intradermal Once Verlee Monte, MD   5 Units at 10/18/16 1506     Discharge Medications: Medication List    STOP taking these medications   bisoprolol-hydrochlorothiazide 5-6.25 MG tablet Commonly known as:  ZIAC  oxyCODONE 15 MG immediate release tablet Commonly known as:  ROXICODONE    TAKE these medications   ALPRAZolam 1 MG tablet Commonly known as:  XANAX Take 1 tablet (1 mg total) by mouth 2 (two) times daily as needed for anxiety.  amitriptyline 100 MG tablet Commonly known as:  ELAVIL Take 100 mg by mouth at bedtime.  FLUoxetine 20 MG capsule Commonly known as:  PROZAC Take 20 mg by mouth daily.  HYDROcodone-acetaminophen 5-325 MG tablet Commonly known as:  NORCO/VICODIN Take 1 tablet by mouth every 6 (six) hours as needed for severe pain.  levothyroxine 25 MCG tablet Commonly known as:  SYNTHROID, LEVOTHROID Take 25 mcg by mouth daily before breakfast.  losartan 50 MG tablet Commonly known as:  COZAAR Take 50 mg by mouth daily.  pantoprazole 40 MG tablet Commonly known as:  PROTONIX Take 40 mg by mouth daily as needed (for acid reflux).    Relevant Imaging Results:  Relevant Lab Results:   Additional Information TB TEST ADMINISTERED TO LEFT ARM ON 10/18/16.  Lilly [place], LCSW

## 2016-10-19 NOTE — Progress Notes (Addendum)
LCSW covering for [employer]. Patient is to discharge to [facility] [place]. Patient will be picked up by [facility] this afternoon.  All information has been faxed to the facility.   LCSW is waiting for medications prescriptions to send to the facility and FL2 to be signed.   3:49 PM Scripts are currently here and have been faxed to facility for pharmacy.  Aram Beecham is working to make sure she can get the medications so late in the day in effort to accept patient.  If she is able to pick up medications, she will come and get patient.   4:35 PM Aram Beecham is coming to pick up the hard scripts from the hospital to take back to [facility] to get them filled.  MD paged for remaining 2 scripts in effort to allow patient to have access to all her medications once she discharges from the hospital. If medications cannot be picked up, then discharge will have to wait until Monday due to facility must having access to medications in effort for patient safety and continuity of care.   Lane Hacker, MSW Clinical Social Work: [employer] Cover[employer] for :  [phone]

## 2016-10-22 ENCOUNTER — Telehealth: Payer: Self-pay | Admitting: Orthopedic Surgery

## 2016-10-22 DIAGNOSIS — S42201D Unspecified fracture of upper end of right humerus, subsequent encounter for fracture with routine healing: Secondary | ICD-10-CM | POA: Diagnosis not present

## 2016-10-22 DIAGNOSIS — R296 Repeated falls: Secondary | ICD-10-CM | POA: Diagnosis not present

## 2016-10-22 DIAGNOSIS — I10 Essential (primary) hypertension: Secondary | ICD-10-CM | POA: Diagnosis not present

## 2016-10-22 DIAGNOSIS — W19XXXD Unspecified fall, subsequent encounter: Secondary | ICD-10-CM | POA: Diagnosis not present

## 2016-10-22 NOTE — Telephone Encounter (Signed)
Routing to Dr Harrison 

## 2016-10-22 NOTE — Telephone Encounter (Signed)
NO THERAPY ON SHOULDER

## 2016-10-22 NOTE — Telephone Encounter (Signed)
Returned call, no answer.

## 2016-10-22 NOTE — Telephone Encounter (Signed)
Rachel Dodson, PT from [facility] [phone] called and wanted clarification on this patient.  He wanted to know if she ready to do pendelum exercises or can he get her out of the sling yet?  Please let him know specifically what she is to be doing at this time  [facility]

## 2016-10-24 DIAGNOSIS — W19XXXD Unspecified fall, subsequent encounter: Secondary | ICD-10-CM | POA: Diagnosis not present

## 2016-10-24 DIAGNOSIS — I10 Essential (primary) hypertension: Secondary | ICD-10-CM | POA: Diagnosis not present

## 2016-10-24 DIAGNOSIS — R296 Repeated falls: Secondary | ICD-10-CM | POA: Diagnosis not present

## 2016-10-24 DIAGNOSIS — S42201D Unspecified fracture of upper end of right humerus, subsequent encounter for fracture with routine healing: Secondary | ICD-10-CM | POA: Diagnosis not present

## 2016-10-24 NOTE — Telephone Encounter (Signed)
Returned call, no answer, left detailed vm 

## 2016-10-29 DIAGNOSIS — W19XXXD Unspecified fall, subsequent encounter: Secondary | ICD-10-CM | POA: Diagnosis not present

## 2016-10-29 DIAGNOSIS — I10 Essential (primary) hypertension: Secondary | ICD-10-CM | POA: Diagnosis not present

## 2016-10-29 DIAGNOSIS — R296 Repeated falls: Secondary | ICD-10-CM | POA: Diagnosis not present

## 2016-10-29 DIAGNOSIS — S42201D Unspecified fracture of upper end of right humerus, subsequent encounter for fracture with routine healing: Secondary | ICD-10-CM | POA: Diagnosis not present

## 2016-11-01 DIAGNOSIS — I10 Essential (primary) hypertension: Secondary | ICD-10-CM | POA: Diagnosis not present

## 2016-11-01 DIAGNOSIS — S42201D Unspecified fracture of upper end of right humerus, subsequent encounter for fracture with routine healing: Secondary | ICD-10-CM | POA: Diagnosis not present

## 2016-11-01 DIAGNOSIS — W19XXXD Unspecified fall, subsequent encounter: Secondary | ICD-10-CM | POA: Diagnosis not present

## 2016-11-01 DIAGNOSIS — R296 Repeated falls: Secondary | ICD-10-CM | POA: Diagnosis not present

## 2016-11-05 ENCOUNTER — Ambulatory Visit (INDEPENDENT_AMBULATORY_CARE_PROVIDER_SITE_OTHER): Payer: Medicare Other

## 2016-11-05 ENCOUNTER — Encounter: Payer: Self-pay | Admitting: Orthopedic Surgery

## 2016-11-05 ENCOUNTER — Ambulatory Visit (INDEPENDENT_AMBULATORY_CARE_PROVIDER_SITE_OTHER): Payer: Self-pay | Admitting: Orthopedic Surgery

## 2016-11-05 DIAGNOSIS — S4291XD Fracture of right shoulder girdle, part unspecified, subsequent encounter for fracture with routine healing: Secondary | ICD-10-CM

## 2016-11-05 NOTE — Progress Notes (Signed)
Patient ID: Rachel Dodson, female   DOB: [birth year], 70 y.o.   MRN: [id number]  Fracture care   Chief Complaint  Patient presents with  . Follow-up    Recheck on right shoulder fracture with xrays, DOI 10-13-16.    X-rays today show varus collapse of this fracture but healing with apex anterior angulation  Past Medical History:  Diagnosis Date  . Anxiety   . Depression   . Hypertension     Passive range of motion to 90 external rotation passively 50  Recommend physical therapy  The patient says she does not want any surgery or a graft return 2 months recheck range of motion functional capacity

## 2016-11-05 NOTE — Patient Instructions (Signed)
Start Therapy   WBAT, aarom as tolerated right shoulder, elbow , wrist and hand  Daily or 3 x a week x 6 weeks

## 2016-11-07 DIAGNOSIS — W19XXXD Unspecified fall, subsequent encounter: Secondary | ICD-10-CM | POA: Diagnosis not present

## 2016-11-07 DIAGNOSIS — R296 Repeated falls: Secondary | ICD-10-CM | POA: Diagnosis not present

## 2016-11-07 DIAGNOSIS — S42201D Unspecified fracture of upper end of right humerus, subsequent encounter for fracture with routine healing: Secondary | ICD-10-CM | POA: Diagnosis not present

## 2016-11-07 DIAGNOSIS — I10 Essential (primary) hypertension: Secondary | ICD-10-CM | POA: Diagnosis not present

## 2016-11-09 ENCOUNTER — Other Ambulatory Visit: Payer: Self-pay | Admitting: Cardiothoracic Surgery

## 2016-11-09 DIAGNOSIS — W19XXXD Unspecified fall, subsequent encounter: Secondary | ICD-10-CM | POA: Diagnosis not present

## 2016-11-09 DIAGNOSIS — I10 Essential (primary) hypertension: Secondary | ICD-10-CM | POA: Diagnosis not present

## 2016-11-09 DIAGNOSIS — S42201D Unspecified fracture of upper end of right humerus, subsequent encounter for fracture with routine healing: Secondary | ICD-10-CM | POA: Diagnosis not present

## 2016-11-09 DIAGNOSIS — R911 Solitary pulmonary nodule: Secondary | ICD-10-CM

## 2016-11-09 DIAGNOSIS — R296 Repeated falls: Secondary | ICD-10-CM | POA: Diagnosis not present

## 2016-11-11 DIAGNOSIS — S42201D Unspecified fracture of upper end of right humerus, subsequent encounter for fracture with routine healing: Secondary | ICD-10-CM | POA: Diagnosis not present

## 2016-11-11 DIAGNOSIS — R296 Repeated falls: Secondary | ICD-10-CM | POA: Diagnosis not present

## 2016-11-11 DIAGNOSIS — W19XXXD Unspecified fall, subsequent encounter: Secondary | ICD-10-CM | POA: Diagnosis not present

## 2016-11-11 DIAGNOSIS — I10 Essential (primary) hypertension: Secondary | ICD-10-CM | POA: Diagnosis not present

## 2016-11-13 DIAGNOSIS — R296 Repeated falls: Secondary | ICD-10-CM | POA: Diagnosis not present

## 2016-11-13 DIAGNOSIS — I10 Essential (primary) hypertension: Secondary | ICD-10-CM | POA: Diagnosis not present

## 2016-11-13 DIAGNOSIS — S42201D Unspecified fracture of upper end of right humerus, subsequent encounter for fracture with routine healing: Secondary | ICD-10-CM | POA: Diagnosis not present

## 2016-11-13 DIAGNOSIS — W19XXXD Unspecified fall, subsequent encounter: Secondary | ICD-10-CM | POA: Diagnosis not present

## 2016-11-19 ENCOUNTER — Emergency Department (HOSPITAL_COMMUNITY): Payer: Medicare Other

## 2016-11-19 ENCOUNTER — Encounter (HOSPITAL_COMMUNITY): Payer: Self-pay | Admitting: Emergency Medicine

## 2016-11-19 ENCOUNTER — Telehealth: Payer: Self-pay | Admitting: Orthopedic Surgery

## 2016-11-19 ENCOUNTER — Emergency Department (HOSPITAL_COMMUNITY)
Admission: EM | Admit: 2016-11-19 | Discharge: 2016-11-19 | Disposition: A | Discharge status: Home / Self Care | Payer: Medicare Other | Attending: Emergency Medicine | Admitting: Emergency Medicine

## 2016-11-19 DIAGNOSIS — R42 Dizziness and giddiness: Secondary | ICD-10-CM | POA: Diagnosis not present

## 2016-11-19 DIAGNOSIS — I10 Essential (primary) hypertension: Secondary | ICD-10-CM | POA: Diagnosis not present

## 2016-11-19 DIAGNOSIS — R404 Transient alteration of awareness: Secondary | ICD-10-CM | POA: Diagnosis not present

## 2016-11-19 DIAGNOSIS — F17210 Nicotine dependence, cigarettes, uncomplicated: Secondary | ICD-10-CM | POA: Diagnosis not present

## 2016-11-19 DIAGNOSIS — Z79899 Other long term (current) drug therapy: Secondary | ICD-10-CM | POA: Insufficient documentation

## 2016-11-19 DIAGNOSIS — R51 Headache: Secondary | ICD-10-CM | POA: Diagnosis not present

## 2016-11-19 DIAGNOSIS — R519 Headache, unspecified: Secondary | ICD-10-CM

## 2016-11-19 LAB — CBC WITH DIFFERENTIAL/PLATELET
BASOS ABS: 0 10*3/uL (ref 0.0–0.1)
Basophils Relative: 0 %
EOS PCT: 1 %
Eosinophils Absolute: 0.1 10*3/uL (ref 0.0–0.7)
HCT: 35.7 % — ABNORMAL LOW (ref 36.0–46.0)
HEMOGLOBIN: 12.1 g/dL (ref 12.0–15.0)
LYMPHS ABS: 1.4 10*3/uL (ref 0.7–4.0)
LYMPHS PCT: 23 %
MCH: 32.3 pg (ref 26.0–34.0)
MCHC: 33.9 g/dL (ref 30.0–36.0)
MCV: 95.2 fL (ref 78.0–100.0)
Monocytes Absolute: 0.5 10*3/uL (ref 0.1–1.0)
Monocytes Relative: 9 %
NEUTROS ABS: 3.9 10*3/uL (ref 1.7–7.7)
NEUTROS PCT: 67 %
PLATELETS: 203 10*3/uL (ref 150–400)
RBC: 3.75 MIL/uL — AB (ref 3.87–5.11)
RDW: 13.9 % (ref 11.5–15.5)
WBC: 5.9 10*3/uL (ref 4.0–10.5)

## 2016-11-19 LAB — BASIC METABOLIC PANEL
ANION GAP: 8 (ref 5–15)
BUN: 10 mg/dL (ref 6–20)
CHLORIDE: 98 mmol/L — AB (ref 101–111)
CO2: 26 mmol/L (ref 22–32)
Calcium: 9.2 mg/dL (ref 8.9–10.3)
Creatinine, Ser: 0.97 mg/dL (ref 0.44–1.00)
GFR calc Af Amer: >60 mL/min (ref >60)
GFR, EST NON AFRICAN AMERICAN: 57 mL/min — AB (ref >60)
GLUCOSE: 90 mg/dL (ref 65–99)
POTASSIUM: 4 mmol/L (ref 3.5–5.1)
Sodium: 132 mmol/L — ABNORMAL LOW (ref 135–145)

## 2016-11-19 MED ORDER — METOPROLOL TARTRATE 5 MG/5ML IV SOLN
5.0000 mg | Freq: Once | INTRAVENOUS | Status: AC
  Administered 2016-11-19: 5 mg via INTRAVENOUS
  Filled 2016-11-19: qty 5

## 2016-11-19 MED ORDER — HYDROMORPHONE HCL 1 MG/ML IJ SOLN
1.0000 mg | Freq: Once | INTRAMUSCULAR | Status: AC
  Administered 2016-11-19: 1 mg via INTRAVENOUS
  Filled 2016-11-19: qty 1

## 2016-11-19 MED ORDER — ONDANSETRON HCL 4 MG/2ML IJ SOLN
4.0000 mg | Freq: Once | INTRAMUSCULAR | Status: AC
  Administered 2016-11-19: 4 mg via INTRAVENOUS
  Filled 2016-11-19: qty 2

## 2016-11-19 NOTE — ED Notes (Signed)
Patient ambulatory to restroom with assistance steady gait.

## 2016-11-19 NOTE — Telephone Encounter (Signed)
ROUTING TO DR HARRISON TO ADVISE 

## 2016-11-19 NOTE — ED Notes (Signed)
[facility] home for a ride home.

## 2016-11-19 NOTE — ED Notes (Signed)
Pt back from x-ray.

## 2016-11-19 NOTE — Telephone Encounter (Signed)
Tressia Danas from [facility] called asking if someone would give him a call in reference to this patient. He is wanting to know if they can take her out of the sling for range of motion. Please call and advise [phone]

## 2016-11-19 NOTE — Telephone Encounter (Signed)
Yes  It's in the progress note

## 2016-11-19 NOTE — ED Triage Notes (Signed)
[facility] called EMS for elevated BP 160/108, pt there for broken shoulder and rehab

## 2016-11-19 NOTE — Discharge Instructions (Signed)
Stable for return to assisted living. CT scan for the headache without any acute findings. Headache improved with pain medicine. Blood pressure improved with pain medicine and Lopressor. Patient most likely requires an additional anti-hypertensive medicine. Her Cozaar is currently maxed out at 100 mg a day. Recommend contacting physician in charge of assisted-living and starting her on an additional antihypertensive.

## 2016-11-19 NOTE — ED Notes (Signed)
MD at the bedside  

## 2016-11-19 NOTE — ED Notes (Signed)
Transportation here to take pt back to [facility], no complaint at present

## 2016-11-19 NOTE — ED Notes (Signed)
Patient ambulatory to restroom with  Assistance, steady gait, clean catch instructions given and advised pt to bring specimen back to room as well.

## 2016-11-19 NOTE — ED Provider Notes (Signed)
[place] DEPT Provider Note   CSN: [id number] Arrival date & time: 11/19/16  1238  By signing my name below, I, Higinio Plan, attest that this documentation has been prepared under the direction and in the presence of Fredia Sorrow, MD . Electronically Signed: Higinio Plan, Scribe. 11/19/2016. 1:05 PM.  History   Chief Complaint Chief Complaint  Patient presents with  . Hypertension   The history is provided by the patient. No language interpreter was used.   HPI Comments: Rachel Dodson is a 70 y.o. female with PMHx of HTN and migraines, brought in by EMS to the Emergency Department from [facility] for an evaluation of HTN (191/102). Pt reports associated headache centralized to her forehead that began a few days ago; she notes hx of similar headaches but notes her current pain is "much worse." She states she is currently at [facility] for rehabilitation of her broken shoulder; she notes her "blood pressure was doing fine before she began staying there 3 weeks ago." She notes her PCP, Dr. Gerarda Fraction, recently changed her HTN medication from Prozac to Cozaar before she arrived at [facility]. She states her blood pressure was checked yesterday but "it was not as elevated as it is today." She denies chest pain, shortness of breath, abdominal pain, nausea, vomiting, diarrhea and use of blood thinners.    Past Medical History:  Diagnosis Date  . Anxiety   . Depression   . Hypertension     Patient Active Problem List   Diagnosis Date Noted  . Right humeral fracture 10/18/2016  . Laceration of fingers without complication 123456  . Hypokalemia 01/28/2014  . ARF (acute renal failure) ([place]) 01/25/2014  . Rhabdomyolysis 01/25/2014  . Dehydration 01/25/2014  . Fall 01/25/2014  . Dementia 01/25/2014  . COPD  GOLD II 01/08/2014  . Hyponatremia 11/10/2013  . Orthostatic hypotension 10/23/2013  . Leukocytosis, unspecified 10/23/2013  . Acute respiratory failure ([place])  10/23/2013  . Lung nodule 10/20/2013  . UTI (urinary tract infection) 10/20/2013    Past Surgical History:  Procedure Laterality Date  . ABDOMINAL HYSTERECTOMY    . VIDEO ASSISTED THORACOSCOPY Left 10/23/2013   Procedure: VIDEO ASSISTED THORACOSCOPY;  Surgeon: Grace Isaac, MD;  Location: [place];  Service: Thoracic;  Laterality: Left;  Marland Kitchen VIDEO BRONCHOSCOPY N/A 10/23/2013   Procedure: VIDEO BRONCHOSCOPY;  Surgeon: Grace Isaac, MD;  Location: [facility] OR;  Service: Thoracic;  Laterality: N/A;    OB History    No data available     Home Medications    Prior to Admission medications   Medication Sig Start Date End Date Taking? Authorizing Provider  ALPRAZolam Duanne Moron) 1 MG tablet Take 1 tablet (1 mg total) by mouth 2 (two) times daily as needed for anxiety. 10/19/16  Yes Verlee Monte, MD  amitriptyline (ELAVIL) 100 MG tablet Take 1 tablet (100 mg total) by mouth at bedtime. 10/19/16  Yes Verlee Monte, MD  FLUoxetine (PROZAC) 20 MG capsule Take 1 capsule (20 mg total) by mouth daily. 10/19/16  Yes Verlee Monte, MD  HYDROcodone-acetaminophen (NORCO/VICODIN) 5-325 MG tablet Take 1 tablet by mouth every 6 (six) hours as needed for severe pain. 10/19/16  Yes Verlee Monte, MD  levothyroxine (SYNTHROID, LEVOTHROID) 25 MCG tablet Take 1 tablet (25 mcg total) by mouth daily before breakfast. 10/19/16  Yes Verlee Monte, MD  losartan (COZAAR) 50 MG tablet Take 1 tablet (50 mg total) by mouth daily. 10/19/16  Yes Verlee Monte, MD  pantoprazole (PROTONIX) 40  MG tablet Take 1 tablet (40 mg total) by mouth daily as needed (for acid reflux). 10/19/16  Yes Verlee Monte, MD    Family History No family history on file.  Social History Social History  Substance Use Topics  . Smoking status: Current Every Day Smoker    Packs/day: 0.25    Years: 15.00    Types: Cigarettes  . Smokeless tobacco: Never Used  . Alcohol use No     Allergies   Latex   Review of Systems Review of Systems    Constitutional: Negative for chills and fever.  HENT: Negative for rhinorrhea and sore throat.   Eyes: Negative for visual disturbance.  Respiratory: Negative for cough and shortness of breath.   Cardiovascular: Negative for chest pain.  Gastrointestinal: Negative for abdominal pain, diarrhea, nausea and vomiting.  Genitourinary: Negative for dysuria and hematuria.  Musculoskeletal: Positive for back pain. Negative for joint swelling.  Skin: Negative for rash.  Neurological: Positive for light-headedness and headaches. Negative for weakness and numbness.  Hematological: Does not bruise/bleed easily.   Physical Exam Updated Vital Signs BP (!) 191/102 (BP Location: Left Arm)   Pulse 107   Temp 98 F (36.7 C) (Oral)   Resp 20   Ht 5\' 3"  (1.6 m)   Wt 118 lb (53.5 kg)   SpO2 100%   BMI 20.90 kg/m   Physical Exam  Constitutional: She is oriented to person, place, and time.  HENT:  Head: Normocephalic.  Mouth/Throat: Oropharynx is clear and moist.  Moist mucous membranes. Bruising to lateral side of right eye.   Eyes: EOM are normal. Pupils are equal, round, and reactive to light. No scleral icterus.  Eyes are tracking normally  Cardiovascular: Normal rate, regular rhythm and normal heart sounds.   Pulmonary/Chest: Effort normal and breath sounds normal.  Lungs clear to auscultation bilaterally   Abdominal: Bowel sounds are normal. There is no tenderness.  Musculoskeletal: She exhibits no edema, tenderness or deformity.  No swelling in ankles. Right arm cap refill is 1 second.   Neurological: She is alert and oriented to person, place, and time. Coordination normal.  Skin: No rash noted. No erythema. No pallor.   ED Treatments / Results  Labs (all labs ordered are listed, but only abnormal results are displayed) Labs Reviewed  CBC WITH DIFFERENTIAL/PLATELET - Abnormal; Notable for the following:       Result Value   RBC 3.75 (*)    HCT 35.7 (*)    All other components  within normal limits  BASIC METABOLIC PANEL - Abnormal; Notable for the following:    Sodium 132 (*)    Chloride 98 (*)    GFR calc non Af Amer 57 (*)    All other components within normal limits    EKG  EKG Interpretation  Date/Time:  Monday November 19 2016 13:25:29 EST Ventricular Rate:  107 PR Interval:    QRS Duration: 136 QT Interval:  382 QTC Calculation: 510 R Axis:   109 Text Interpretation:  Sinus tachycardia Probable left atrial enlargement RBBB and LPFB Borderline ST elevation, lateral leads No significant change since last tracing in 2014 Confirmed by Baruch Lewers  MD, [place] [phone]) on 11/19/2016 1:32:28 PM       Radiology Ct Head Wo Contrast  Result Date: 11/19/2016 CLINICAL DATA:  Frontal headache and hypertension. EXAM: CT HEAD WITHOUT CONTRAST TECHNIQUE: Contiguous axial images were obtained from the base of the skull through the vertex without intravenous contrast. COMPARISON:  10/16/2016 and  01/25/2014 FINDINGS: Brain: Ventricles, cisterns and other CSF spaces are within normal. There is no mass, mass effect, shift of midline structures or acute hemorrhage. No evidence of acute infarction. Minimal chronic ischemic microvascular disease. Vascular: Minimal calcified plaque over the cavernous segment of the internal carotid arteries. Skull: Within normal. Sinuses/Orbits: Within normal. Other: Resolution of the previously seen right frontoparietal scalp contusion. IMPRESSION: No acute intracranial findings. Chronic ischemic microvascular disease. Electronically Signed   By: Marin Olp M.D.   On: 11/19/2016 15:11    Procedures Procedures (including critical care time)  Medications Ordered in ED Medications  metoprolol (LOPRESSOR) injection 5 mg (5 mg Intravenous Given 11/19/16 1404)  HYDROmorphone (DILAUDID) injection 1 mg (1 mg Intravenous Given 11/19/16 1448)  ondansetron (ZOFRAN) injection 4 mg (4 mg Intravenous Given 11/19/16 1446)    DIAGNOSTIC  STUDIES:  Oxygen Saturation is 100% on RA, normal by my interpretation.    COORDINATION OF CARE:  12:59 PM Discussed treatment plan with pt at bedside and pt agreed to plan.  Initial Impression / Assessment and Plan / ED Course  I have reviewed the triage vital signs and the nursing notes.  Pertinent labs & imaging results that were available during my care of the patient were reviewed by me and considered in my medical decision making (see chart for details).  Clinical Course     Patient sent in from assisted living nursing care facility. For high blood pressure and headache on the forehead area. Head CT negative for any head bleed or acute findings. Patient's blood pressure improved here with the addition of some IV Lopressor and pain control for the headache. Patient blood pressure is now 146/78.  Based on patient's story sounds his if she needs an additional antihypertensive medication started to her regiment. Patient stable for discharge back to nursing facility.  I personally performed the services described in this documentation, which was scribed in my presence. The recorded information has been reviewed and is accurate.     Final Clinical Impressions(s) / ED Diagnoses   Final diagnoses:  Essential hypertension  Nonintractable headache, unspecified chronicity pattern, unspecified headache type    New Prescriptions New Prescriptions   No medications on file     Fredia Sorrow, MD 11/19/16 1548

## 2016-11-20 NOTE — Telephone Encounter (Signed)
RETURNED CALL TO [facility], NO ANSWER, LEFT DETAILED VM

## 2016-11-20 NOTE — Telephone Encounter (Signed)
Is it okay to use walker, she has had a fall

## 2016-11-20 NOTE — Telephone Encounter (Signed)
YES

## 2016-11-21 DIAGNOSIS — I10 Essential (primary) hypertension: Secondary | ICD-10-CM | POA: Diagnosis not present

## 2016-11-21 DIAGNOSIS — R296 Repeated falls: Secondary | ICD-10-CM | POA: Diagnosis not present

## 2016-11-21 DIAGNOSIS — W19XXXD Unspecified fall, subsequent encounter: Secondary | ICD-10-CM | POA: Diagnosis not present

## 2016-11-21 DIAGNOSIS — E039 Hypothyroidism, unspecified: Secondary | ICD-10-CM | POA: Diagnosis not present

## 2016-11-21 DIAGNOSIS — S42201D Unspecified fracture of upper end of right humerus, subsequent encounter for fracture with routine healing: Secondary | ICD-10-CM | POA: Diagnosis not present

## 2016-11-23 ENCOUNTER — Emergency Department (HOSPITAL_COMMUNITY): Payer: Medicare Other

## 2016-11-23 ENCOUNTER — Encounter (HOSPITAL_COMMUNITY): Payer: Self-pay | Admitting: Emergency Medicine

## 2016-11-23 ENCOUNTER — Emergency Department (HOSPITAL_COMMUNITY)
Admission: EM | Admit: 2016-11-23 | Discharge: 2016-11-23 | Disposition: A | Discharge status: Home / Self Care | Payer: Medicare Other | Attending: Emergency Medicine | Admitting: Emergency Medicine

## 2016-11-23 DIAGNOSIS — Y92129 Unspecified place in nursing home as the place of occurrence of the external cause: Secondary | ICD-10-CM | POA: Diagnosis not present

## 2016-11-23 DIAGNOSIS — S4981XA Other specified injuries of right shoulder and upper arm, initial encounter: Secondary | ICD-10-CM | POA: Diagnosis not present

## 2016-11-23 DIAGNOSIS — R51 Headache: Secondary | ICD-10-CM | POA: Diagnosis not present

## 2016-11-23 DIAGNOSIS — R220 Localized swelling, mass and lump, head: Secondary | ICD-10-CM | POA: Diagnosis not present

## 2016-11-23 DIAGNOSIS — Y939 Activity, unspecified: Secondary | ICD-10-CM | POA: Diagnosis not present

## 2016-11-23 DIAGNOSIS — S0990XA Unspecified injury of head, initial encounter: Secondary | ICD-10-CM | POA: Diagnosis not present

## 2016-11-23 DIAGNOSIS — F17210 Nicotine dependence, cigarettes, uncomplicated: Secondary | ICD-10-CM | POA: Diagnosis not present

## 2016-11-23 DIAGNOSIS — W1809XA Striking against other object with subsequent fall, initial encounter: Secondary | ICD-10-CM | POA: Insufficient documentation

## 2016-11-23 DIAGNOSIS — R221 Localized swelling, mass and lump, neck: Secondary | ICD-10-CM | POA: Diagnosis not present

## 2016-11-23 DIAGNOSIS — I10 Essential (primary) hypertension: Secondary | ICD-10-CM | POA: Insufficient documentation

## 2016-11-23 DIAGNOSIS — S0101XA Laceration without foreign body of scalp, initial encounter: Secondary | ICD-10-CM | POA: Insufficient documentation

## 2016-11-23 DIAGNOSIS — S199XXA Unspecified injury of neck, initial encounter: Secondary | ICD-10-CM | POA: Diagnosis not present

## 2016-11-23 DIAGNOSIS — Z91040 Latex allergy status: Secondary | ICD-10-CM | POA: Insufficient documentation

## 2016-11-23 DIAGNOSIS — R030 Elevated blood-pressure reading, without diagnosis of hypertension: Secondary | ICD-10-CM | POA: Diagnosis not present

## 2016-11-23 DIAGNOSIS — S0181XA Laceration without foreign body of other part of head, initial encounter: Secondary | ICD-10-CM | POA: Insufficient documentation

## 2016-11-23 DIAGNOSIS — Y999 Unspecified external cause status: Secondary | ICD-10-CM | POA: Insufficient documentation

## 2016-11-23 DIAGNOSIS — W19XXXA Unspecified fall, initial encounter: Secondary | ICD-10-CM

## 2016-11-23 MED ORDER — BUPIVACAINE HCL (PF) 0.5 % IJ SOLN
10.0000 mL | Freq: Once | INTRAMUSCULAR | Status: AC
  Administered 2016-11-23: 10 mL
  Filled 2016-11-23: qty 30

## 2016-11-23 MED ORDER — LIDOCAINE-EPINEPHRINE-TETRACAINE (LET) SOLUTION
3.0000 mL | Freq: Once | NASAL | Status: AC
  Administered 2016-11-23: 3 mL via TOPICAL
  Filled 2016-11-23: qty 3

## 2016-11-23 MED ORDER — ACETAMINOPHEN 325 MG PO TABS
650.0000 mg | ORAL_TABLET | Freq: Once | ORAL | Status: AC
  Administered 2016-11-23: 650 mg via ORAL
  Filled 2016-11-23: qty 2

## 2016-11-23 NOTE — Discharge Instructions (Signed)
Wash your hair when you get back to your facility, then keep the lacerations clean and dry. Use triple antibiotic ointment on the wounds. The sutures should be removed in about 6 days. Recheck sooner if they get infected. Return to the ED for any problems listed on the head injury sheet.

## 2016-11-23 NOTE — ED Triage Notes (Signed)
Per EMS: patient was found lying on the floor of her room at [facility] says she just remember waking up on the floor. Pt has laceration to left side of her head. Pt C/O pain in her head 6/10. Pt presents wearing a sling on her right arm and bruising to her right eye that was from a fall earlier this week.

## 2016-11-23 NOTE — ED Provider Notes (Signed)
[place] DEPT Provider Note   CSN: [id number] Arrival date & time: 11/23/16  0302  Time seen 03:15 AM   History   Chief Complaint Chief Complaint  Patient presents with  . Fall    HPI Rachel Dodson is a 70 y.o. female.  HPI  patient reports she has been in a assisted living facility for about a month and she was asleep and the next thing she knew she was falling out of her bed. She states she landed on her left side onto her left hip and hitting her left head. She complains of some pain in her left forehead however her left hip does not hurt. She is noted to have a laceration that does not have any other injuries. Patient's noted to have a sling on her right arm from a prior injury and also bruising around her right eye which is also from a prior injury.  PCP Dr. Riley Kill.  Past Medical History:  Diagnosis Date  . Anxiety   . Depression   . Hypertension     Patient Active Problem List   Diagnosis Date Noted  . Right humeral fracture 10/18/2016  . Laceration of fingers without complication 123456  . Hypokalemia 01/28/2014  . ARF (acute renal failure) ([place]) 01/25/2014  . Rhabdomyolysis 01/25/2014  . Dehydration 01/25/2014  . Fall 01/25/2014  . Dementia 01/25/2014  . COPD  GOLD II 01/08/2014  . Hyponatremia 11/10/2013  . Orthostatic hypotension 10/23/2013  . Leukocytosis, unspecified 10/23/2013  . Acute respiratory failure ([place]) 10/23/2013  . Lung nodule 10/20/2013  . UTI (urinary tract infection) 10/20/2013    Past Surgical History:  Procedure Laterality Date  . ABDOMINAL HYSTERECTOMY    . VIDEO ASSISTED THORACOSCOPY Left 10/23/2013   Procedure: VIDEO ASSISTED THORACOSCOPY;  Surgeon: Grace Isaac, MD;  Location: [facility];  Service: Thoracic;  Laterality: Left;  Marland Kitchen VIDEO BRONCHOSCOPY N/A 10/23/2013   Procedure: VIDEO BRONCHOSCOPY;  Surgeon: Grace Isaac, MD;  Location: [facility] OR;  Service: Thoracic;  Laterality: N/A;    OB History    No data available         Home Medications    Prior to Admission medications   Medication Sig Start Date End Date Taking? Authorizing Provider  ALPRAZolam Duanne Moron) 1 MG tablet Take 1 tablet (1 mg total) by mouth 2 (two) times daily as needed for anxiety. 10/19/16   Verlee Monte, MD  amitriptyline (ELAVIL) 100 MG tablet Take 1 tablet (100 mg total) by mouth at bedtime. 10/19/16   Verlee Monte, MD  FLUoxetine (PROZAC) 20 MG capsule Take 1 capsule (20 mg total) by mouth daily. 10/19/16   Verlee Monte, MD  HYDROcodone-acetaminophen (NORCO/VICODIN) 5-325 MG tablet Take 1 tablet by mouth every 6 (six) hours as needed for severe pain. 10/19/16   Verlee Monte, MD  levothyroxine (SYNTHROID, LEVOTHROID) 25 MCG tablet Take 1 tablet (25 mcg total) by mouth daily before breakfast. 10/19/16   Verlee Monte, MD  losartan (COZAAR) 50 MG tablet Take 1 tablet (50 mg total) by mouth daily. 10/19/16   Verlee Monte, MD  pantoprazole (PROTONIX) 40 MG tablet Take 1 tablet (40 mg total) by mouth daily as needed (for acid reflux). 10/19/16   Verlee Monte, MD    Family History No family history on file.  Social History Social History  Substance Use Topics  . Smoking status: Current Every Day Smoker    Packs/day: 0.25    Years: 15.00    Types: Cigarettes  . Smokeless tobacco: Never  Used  . Alcohol use No  was living at home alone until about 1 month ago   Allergies   Latex   Review of Systems Review of Systems  All other systems reviewed and are negative.    Physical Exam Updated Vital Signs BP 187/85 (BP Location: Left Arm)   Pulse 89   Temp 97.7 F (36.5 C) (Oral)   Resp 18   Ht 5\' 3"  (1.6 m)   Wt 118 lb (53.5 kg)   SpO2 98%   BMI 20.90 kg/m   Vital signs normal except for hypertension   Physical Exam  Constitutional: She is oriented to person, place, and time.  Non-toxic appearance. She does not appear ill. No distress.  Frail elderly female  HENT:  Head: Normocephalic.  Right Ear: External ear  normal.  Left Ear: External ear normal.  Nose: Nose normal. No mucosal edema or rhinorrhea.  Mouth/Throat: Oropharynx is clear and moist and mucous membranes are normal. No dental abscesses or uvula swelling.  Patient has a laceration in her left forehead near the scalp line. It measures 2.5 cm. There is another laceration, V shaped in the scalp superior to the left ear that is 2 cm in length.   Eyes: Conjunctivae and EOM are normal. Pupils are equal, round, and reactive to light.  Neck: Normal range of motion and full passive range of motion without pain. Neck supple.  She states that she fell she has some new stiffness in her neck. There is no focal tenderness to palpation.  Cardiovascular: Normal rate, regular rhythm and normal heart sounds.  Exam reveals no gallop and no friction rub.   No murmur heard. Pulmonary/Chest: Effort normal and breath sounds normal. No respiratory distress. She has no wheezes. She has no rhonchi. She has no rales. She exhibits no tenderness and no crepitus.  Abdominal: Soft. Normal appearance and bowel sounds are normal. She exhibits no distension. There is no tenderness. There is no rebound and no guarding.  Musculoskeletal: Normal range of motion. She exhibits no edema or tenderness.  Moves all extremities well except for right upper stroma which is in a sling. She can flex her left knee without causing pain in her knee or her hip. Her left arm is without pain to palpation or range of motion.  Neurological: She is alert and oriented to person, place, and time. She has normal strength. No cranial nerve deficit.  Skin: Skin is warm, dry and intact. No rash noted. No erythema. No pallor.  Psychiatric: She has a normal mood and affect. Her speech is normal and behavior is normal. Her mood appears not anxious.  Nursing note and vitals reviewed.        ED Treatments / Results  Labs (all labs ordered are listed, but only abnormal results are displayed) Labs  Reviewed - No data to display  EKG  EKG Interpretation None       Radiology Ct Head Wo Contrast Ct Cervical Spine Wo Contrast  Result Date: 11/23/2016 CLINICAL DATA:  Found on floor. Laceration at the left side of the head. Headache. Concern for cervical spine injury. Initial encounter. EXAM: CT HEAD WITHOUT CONTRAST CT CERVICAL SPINE WITHOUT CONTRAST TECHNIQUE: Multidetector CT imaging of the head and cervical spine was performed following the standard protocol without intravenous contrast. Multiplanar CT image reconstructions of the cervical spine were also generated. COMPARISON:  CT of the head performed 11/19/2016, and CT of the cervical spine performed 10/16/2016 FINDINGS: CT HEAD FINDINGS Brain: No  evidence of acute infarction, hemorrhage, hydrocephalus, extra-axial collection or mass lesion/mass effect. Prominence of the ventricles and sulci reflects mild cortical volume loss. Mild periventricular white matter change likely reflects small vessel ischemic microangiopathy. 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 The brainstem and fourth ventricle are within normal limits. The basal ganglia are unremarkable in appearance. No mass effect or midline shift is seen. Vascular: No hyperdense vessel or unexpected calcification. Skull: No hyperdense vessel or unexpected calcification. Sinuses/Orbits: The orbits are within normal limits. The paranasal sinuses and mastoid air cells are well-aerated. Other: Soft tissue swelling is noted overlying the left frontal and parietal calvarium, with associated laceration. CT CERVICAL SPINE FINDINGS Alignment: Normal. Skull base and vertebrae: No acute fracture. No primary bone lesion or focal pathologic process. Soft tissues and spinal canal: No prevertebral fluid or swelling. No visible canal hematoma. Disc levels: Multilevel disc space narrowing is noted along the cervical spine, with scattered  anterior and posterior disc osteophyte complexes. Vacuum phenomenon is noted at C5-C6. Upper chest: Mild interstitial prominence is noted at the lung apices. Calcification is seen at the carotid bifurcations bilaterally, with likely mild to moderate luminal narrowing bilaterally. Hypodensities within the thyroid gland are nonspecific, without evidence of a dominant mass. There is rightward deviation of the trachea from the right innominate artery. Other: No additional soft tissue abnormalities are seen. IMPRESSION: 1. No evidence of traumatic intracranial injury or fracture. 2. No evidence of acute fracture or subluxation along the cervical spine. 3. Soft tissue swelling overlying the left frontal and parietal calvarium, with associated laceration. 4. Mild cortical volume loss and scattered small vessel ischemic microangiopathy. 5. 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 6. Mild diffuse degenerative change along the cervical spine. 7. Mild interstitial prominence at the lung apices, possibly reflecting transient interstitial edema. 8. Calcification at the carotid bifurcations, with likely mild to moderate luminal narrowing bilaterally. Carotid ultrasound would be helpful for further evaluation, when and as deemed clinically appropriate. Electronically Signed   By: Garald Balding M.D.   On: 11/23/2016 04:15    Procedures .Marland KitchenLaceration Repair Date/Time: 11/23/2016 6:28 AM Performed by: Tomi Bamberger, Cleston Lautner Authorized by: Rolland Porter   Consent:    Consent obtained:  Verbal   Consent given by:  Patient   Alternatives discussed:  No treatment Anesthesia (see MAR for exact dosages):    Anesthesia method:  Local infiltration and topical application   Topical anesthetic:  LET   Local anesthetic: marcaine 0.5 % Laceration details:    Location: left forehead.   Length (cm):  2.5   Laceration depth: subcutaneous. Repair type:    Repair type:   Simple Pre-procedure details:    Preparation:  Patient was prepped and draped in usual sterile fashion and imaging obtained to evaluate for foreign bodies Exploration:    Hemostasis achieved with:  Direct pressure   Wound extent: no foreign bodies/material noted   Treatment:    Area cleansed with:  Saline and Betadine   Amount of cleaning:  Standard   Visualized foreign bodies/material removed: no   Skin repair:    Repair method:  Sutures   Suture size:  5-0   Suture material:  Nylon   Suture technique:  Simple interrupted   Number of sutures: 4. Approximation:    Approximation:  Close   Vermilion border: well-aligned   Post-procedure details:    Dressing:  Antibiotic ointment   Patient tolerance of procedure:  Tolerated well, no immediate complications     LACERATION REPAIR Performed by: [place] by: Janice Norrie Consent: Verbal consent obtained. Risks and benefits: risks, benefits and alternatives were discussed Consent given by: patient Patient identity confirmed: provided demographic data Prepped and Draped in normal sterile fashion Wound explored  Laceration Location: left scalp  Laceration Length: 2 cm  No Foreign Bodies seen or palpated  Anesthesia: local infiltration   Local anesthetic:0.5 % marcaine  Anesthetic total: 6 ml (total)  Irrigation method: syringe Amount of cleaning: standard  Skin closure: 4-0 nylon  Number of sutures: 4  Technique: simple interrupted  Patient tolerance: Patient tolerated the procedure well with no immediate complications.     Medications Ordered in ED Medications  lidocaine-EPINEPHrine-tetracaine (LET) solution (3 mLs Topical Given 11/23/16 0335)  acetaminophen (TYLENOL) tablet 650 mg (650 mg Oral Given 11/23/16 0336)  bupivacaine (MARCAINE) 0.5 % injection 10 mL (10 mLs Infiltration Given by Other 11/23/16 [id number])     Initial Impression / Assessment and Plan / ED Course  I have reviewed the triage  vital signs and the nursing notes.  Pertinent labs & imaging results that were available during my care of the patient were reviewed by me and considered in my medical decision making (see chart for details).  Clinical Course    Let was placed on her laceration and she went to see radiology to get a CT of her head in her cervical spine  Patient's lacerations were repaired. While repairing the forehead laceration I found a laceration in her scalp. Patient has no further complaints. She was discharged back to her facility.  Final Clinical Impressions(s) / ED Diagnoses   Final diagnoses:  Fall at nursing home, initial encounter  Laceration of scalp, initial encounter  Laceration of forehead, initial encounter   Plan discharge  Rolland Porter, MD, Barbette Or, MD 11/23/16 [phone]

## 2016-11-23 NOTE — ED Notes (Signed)
PT left at this time via wheelchair and transferred into [facility] and transported back to [facility].

## 2016-11-28 DIAGNOSIS — S42201D Unspecified fracture of upper end of right humerus, subsequent encounter for fracture with routine healing: Secondary | ICD-10-CM | POA: Diagnosis not present

## 2016-11-28 DIAGNOSIS — R296 Repeated falls: Secondary | ICD-10-CM | POA: Diagnosis not present

## 2016-11-28 DIAGNOSIS — W19XXXD Unspecified fall, subsequent encounter: Secondary | ICD-10-CM | POA: Diagnosis not present

## 2016-11-28 DIAGNOSIS — I10 Essential (primary) hypertension: Secondary | ICD-10-CM | POA: Diagnosis not present

## 2016-11-29 ENCOUNTER — Telehealth: Payer: Self-pay | Admitting: Orthopedic Surgery

## 2016-11-29 DIAGNOSIS — I10 Essential (primary) hypertension: Secondary | ICD-10-CM | POA: Diagnosis not present

## 2016-11-29 DIAGNOSIS — R296 Repeated falls: Secondary | ICD-10-CM | POA: Diagnosis not present

## 2016-11-29 DIAGNOSIS — W19XXXD Unspecified fall, subsequent encounter: Secondary | ICD-10-CM | POA: Diagnosis not present

## 2016-11-29 DIAGNOSIS — S42201D Unspecified fracture of upper end of right humerus, subsequent encounter for fracture with routine healing: Secondary | ICD-10-CM | POA: Diagnosis not present

## 2016-11-29 NOTE — Telephone Encounter (Signed)
Verbal okay given.  

## 2016-11-29 NOTE — Telephone Encounter (Signed)
Rachel Dodson from [facility] called wanting to get a verbal order to extend therapy to 2 times a week for 3 more weeks. They are working on range of motion of the right shoulder.   [phone] please leave a message if there is no answer-per Suezanne Jacquet  Please call and advise

## 2016-12-03 DIAGNOSIS — I7389 Other specified peripheral vascular diseases: Secondary | ICD-10-CM | POA: Diagnosis not present

## 2016-12-03 DIAGNOSIS — L84 Corns and callosities: Secondary | ICD-10-CM | POA: Diagnosis not present

## 2016-12-03 DIAGNOSIS — M2010 Hallux valgus (acquired), unspecified foot: Secondary | ICD-10-CM | POA: Diagnosis not present

## 2016-12-03 DIAGNOSIS — B351 Tinea unguium: Secondary | ICD-10-CM | POA: Diagnosis not present

## 2016-12-04 DIAGNOSIS — W19XXXD Unspecified fall, subsequent encounter: Secondary | ICD-10-CM | POA: Diagnosis not present

## 2016-12-04 DIAGNOSIS — I10 Essential (primary) hypertension: Secondary | ICD-10-CM | POA: Diagnosis not present

## 2016-12-04 DIAGNOSIS — R296 Repeated falls: Secondary | ICD-10-CM | POA: Diagnosis not present

## 2016-12-04 DIAGNOSIS — S42201D Unspecified fracture of upper end of right humerus, subsequent encounter for fracture with routine healing: Secondary | ICD-10-CM | POA: Diagnosis not present

## 2016-12-06 DIAGNOSIS — R296 Repeated falls: Secondary | ICD-10-CM | POA: Diagnosis not present

## 2016-12-06 DIAGNOSIS — W19XXXD Unspecified fall, subsequent encounter: Secondary | ICD-10-CM | POA: Diagnosis not present

## 2016-12-06 DIAGNOSIS — S42201D Unspecified fracture of upper end of right humerus, subsequent encounter for fracture with routine healing: Secondary | ICD-10-CM | POA: Diagnosis not present

## 2016-12-06 DIAGNOSIS — I10 Essential (primary) hypertension: Secondary | ICD-10-CM | POA: Diagnosis not present

## 2016-12-11 DIAGNOSIS — W19XXXD Unspecified fall, subsequent encounter: Secondary | ICD-10-CM | POA: Diagnosis not present

## 2016-12-11 DIAGNOSIS — I10 Essential (primary) hypertension: Secondary | ICD-10-CM | POA: Diagnosis not present

## 2016-12-11 DIAGNOSIS — S42201D Unspecified fracture of upper end of right humerus, subsequent encounter for fracture with routine healing: Secondary | ICD-10-CM | POA: Diagnosis not present

## 2016-12-11 DIAGNOSIS — R296 Repeated falls: Secondary | ICD-10-CM | POA: Diagnosis not present

## 2016-12-13 ENCOUNTER — Ambulatory Visit: Payer: Self-pay | Admitting: Cardiothoracic Surgery

## 2016-12-13 ENCOUNTER — Other Ambulatory Visit: Payer: Self-pay

## 2016-12-13 DIAGNOSIS — R296 Repeated falls: Secondary | ICD-10-CM | POA: Diagnosis not present

## 2016-12-13 DIAGNOSIS — I10 Essential (primary) hypertension: Secondary | ICD-10-CM | POA: Diagnosis not present

## 2016-12-13 DIAGNOSIS — S42201D Unspecified fracture of upper end of right humerus, subsequent encounter for fracture with routine healing: Secondary | ICD-10-CM | POA: Diagnosis not present

## 2016-12-13 DIAGNOSIS — W19XXXD Unspecified fall, subsequent encounter: Secondary | ICD-10-CM | POA: Diagnosis not present

## 2016-12-18 DIAGNOSIS — R296 Repeated falls: Secondary | ICD-10-CM | POA: Diagnosis not present

## 2016-12-18 DIAGNOSIS — I10 Essential (primary) hypertension: Secondary | ICD-10-CM | POA: Diagnosis not present

## 2016-12-18 DIAGNOSIS — W19XXXD Unspecified fall, subsequent encounter: Secondary | ICD-10-CM | POA: Diagnosis not present

## 2016-12-18 DIAGNOSIS — S42201D Unspecified fracture of upper end of right humerus, subsequent encounter for fracture with routine healing: Secondary | ICD-10-CM | POA: Diagnosis not present

## 2016-12-20 DIAGNOSIS — W19XXXD Unspecified fall, subsequent encounter: Secondary | ICD-10-CM | POA: Diagnosis not present

## 2016-12-20 DIAGNOSIS — I10 Essential (primary) hypertension: Secondary | ICD-10-CM | POA: Diagnosis not present

## 2016-12-20 DIAGNOSIS — R296 Repeated falls: Secondary | ICD-10-CM | POA: Diagnosis not present

## 2016-12-20 DIAGNOSIS — S42201D Unspecified fracture of upper end of right humerus, subsequent encounter for fracture with routine healing: Secondary | ICD-10-CM | POA: Diagnosis not present

## 2016-12-24 IMAGING — CT CT HEAD W/O CM
4 of 8 series · 14 of 47 positions shown, 15 images · non-contrast
Comparison: CT of the head performed 11/19/2016, and CT of the
cervical spine performed 10/16/2016

CLINICAL DATA: Found on floor. Laceration at the left side of the
head. Headache. Concern for cervical spine injury. Initial
encounter.

EXAM:
CT HEAD WITHOUT CONTRAST
CT CERVICAL SPINE WITHOUT CONTRAST
TECHNIQUE: Multidetector CT imaging of the head and cervical spine was
performed following the standard protocol without intravenous
contrast. Multiplanar CT image reconstructions of the cervical spine
were also generated.

[Series 2: head wo · axial · 0.42mm/px · z∈[+1438,+1583]mm · 3 of 30 slices shown, 4 images]
[im 1/30  brain]
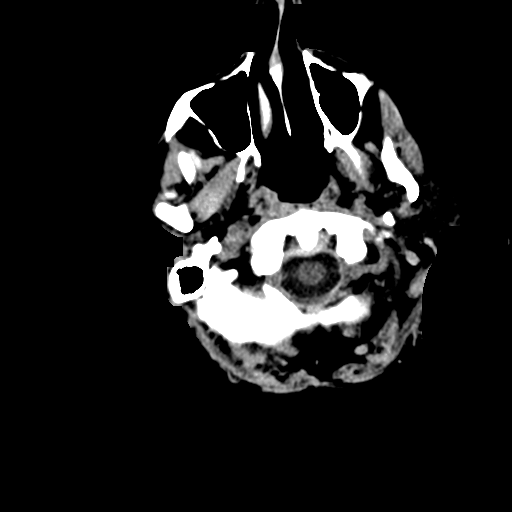
[im 1/30  bone]
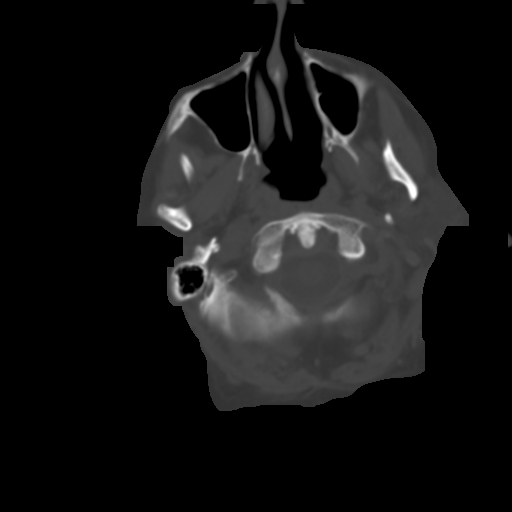
[im 15/30  brain]
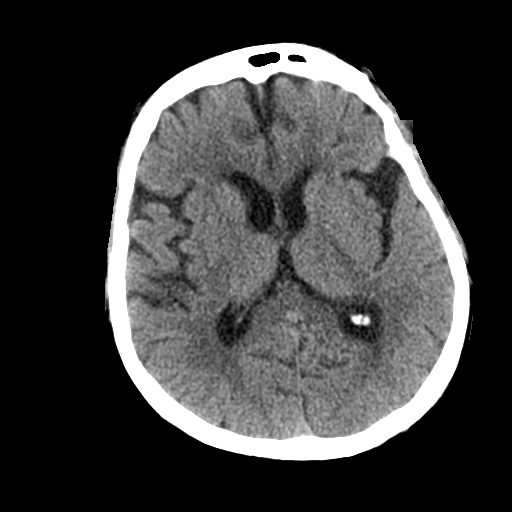
[im 30/30  brain]
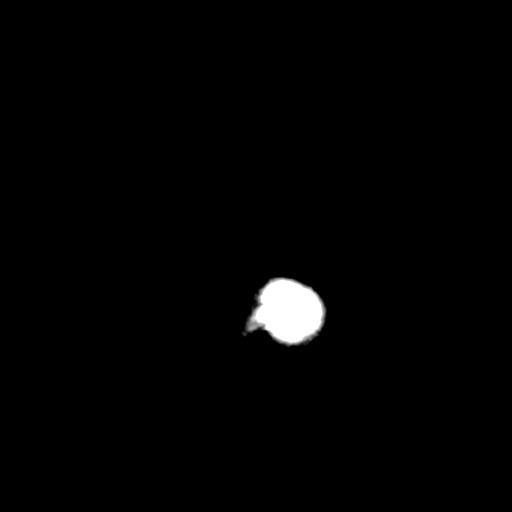

[Series 4: coronal soft tissue · coronal · 0.32mm/px · 3 of 71 slices shown]
[im 24/71  brain]
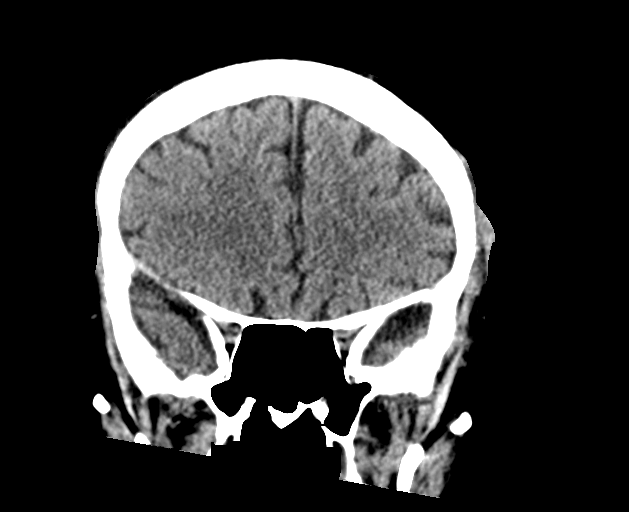
[im 36/71  brain]
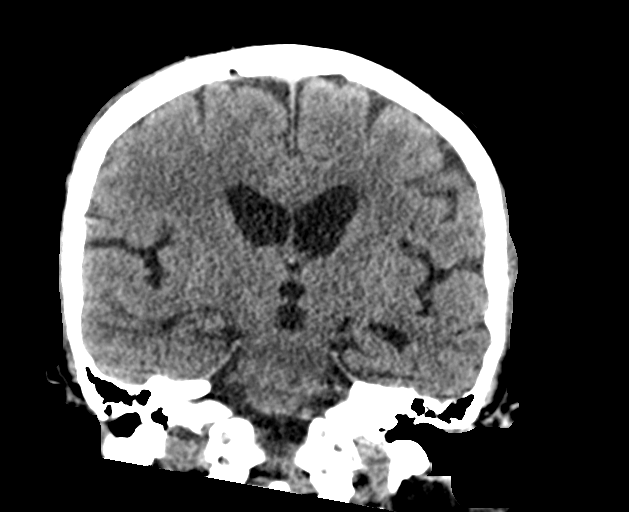
[im 47/71  brain]
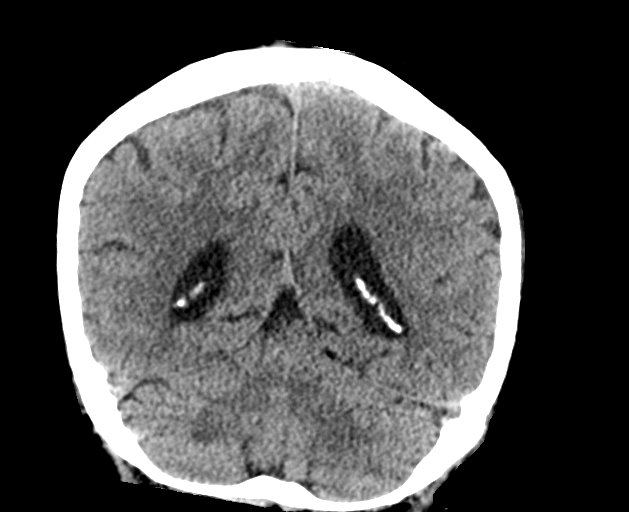

[Series 5: sagittal soft tissue · sagittal · 0.29mm/px · 1 of 61 slices shown]
[im 31/61  brain]
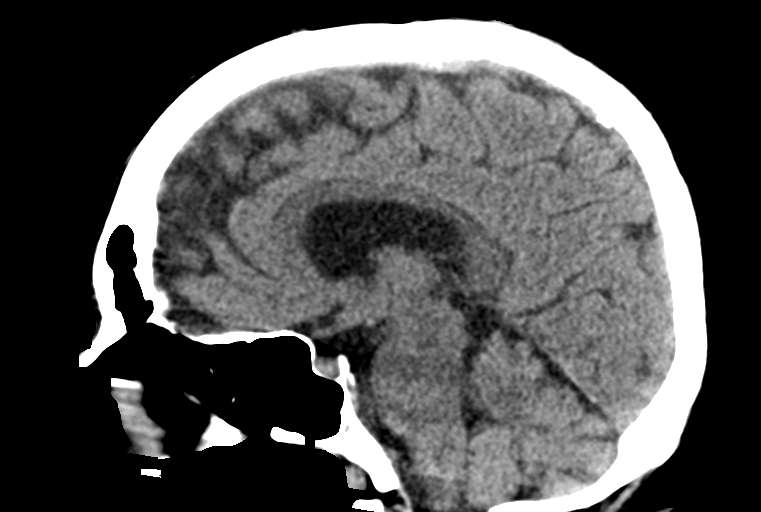

[Series 14: orthogonal bone · axial · 0.24mm/px · z∈[+1247,+1381]mm · 7 of 115 slices shown]
[im 11/115  bone]
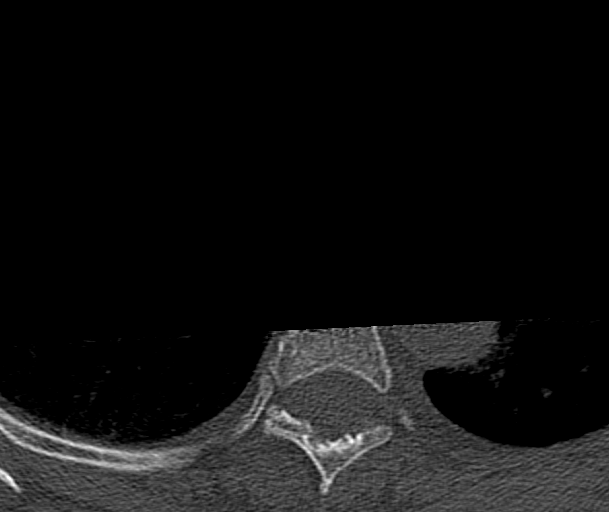
[im 21/115  bone]
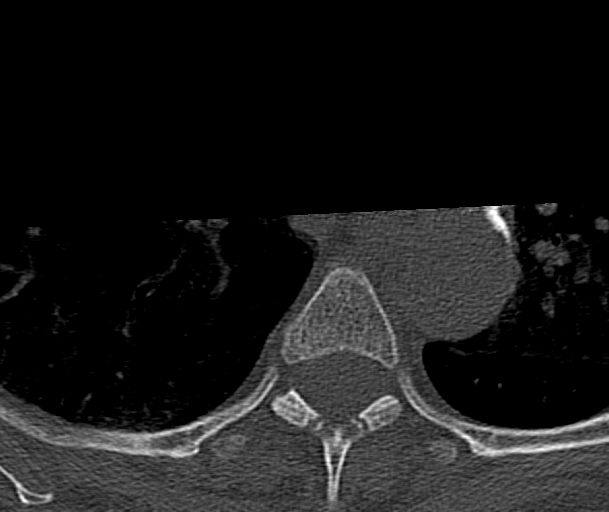
[im 42/115  bone]
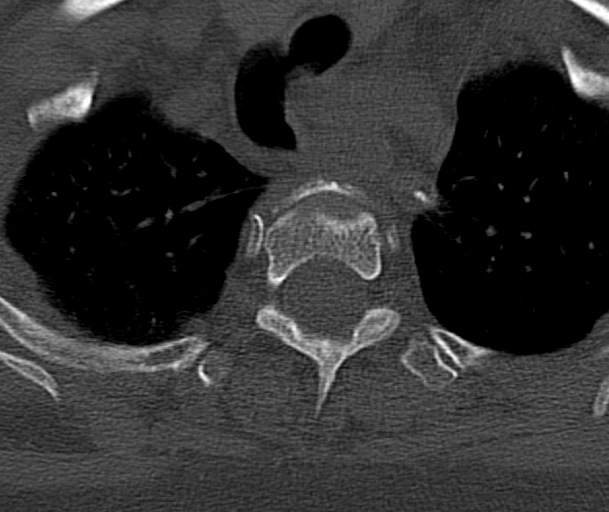
[im 52/115  bone]
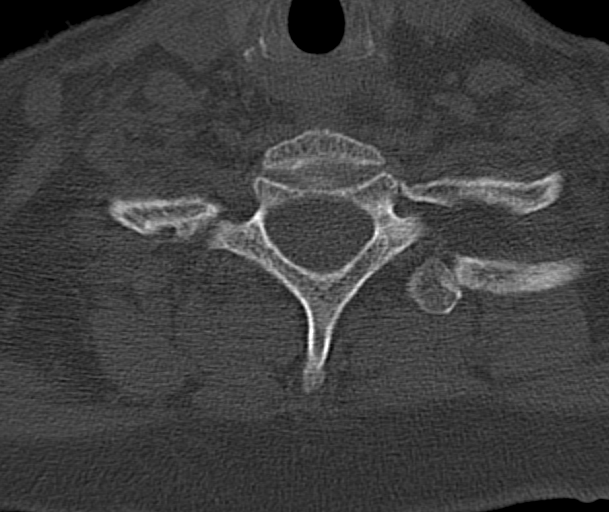
[im 63/115  bone]
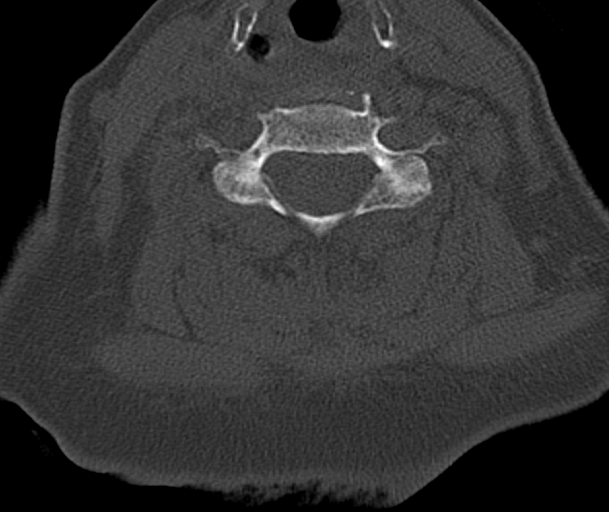
[im 73/115  bone]
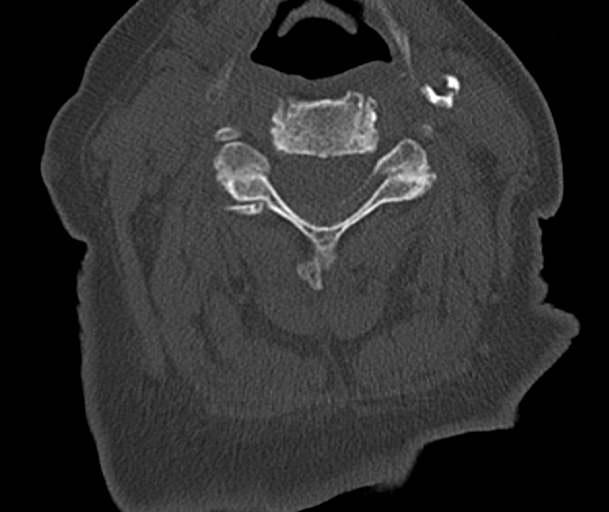
[im 94/115  bone]
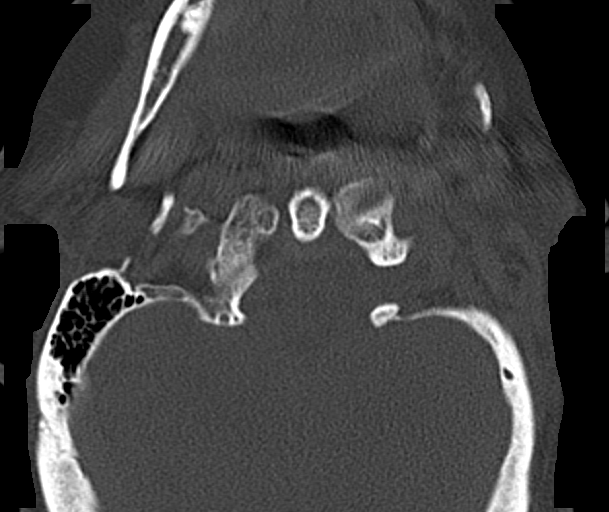

[14 of 47 positions shown; findings below may reference images not displayed]

FINDINGS: CT HEAD FINDINGS

Brain: No evidence of acute infarction, hemorrhage, hydrocephalus,
extra-axial collection or mass lesion/mass effect.

Prominence of the ventricles and sulci reflects mild cortical volume
loss. Mild periventricular white matter change likely reflects small
vessel ischemic microangiopathy. Mild chronic encephalomalacia is
noted at the left frontoparietal region, likely reflecting remote
infarct.

The brainstem and fourth ventricle are within normal limits. The
basal ganglia are unremarkable in appearance. No mass effect or
midline shift is seen.

Vascular: No hyperdense vessel or unexpected calcification.

Skull: No hyperdense vessel or unexpected calcification.

Sinuses/Orbits: The orbits are within normal limits. The paranasal
sinuses and mastoid air cells are well-aerated.

Other: Soft tissue swelling is noted overlying the left frontal and
parietal calvarium, with associated laceration.

CT CERVICAL SPINE FINDINGS

Alignment: Normal.

Skull base and vertebrae: No acute fracture. No primary bone lesion
or focal pathologic process.

Soft tissues and spinal canal: No prevertebral fluid or swelling. No
visible canal hematoma.

Disc levels: Multilevel disc space narrowing is noted along the
cervical spine, with scattered anterior and posterior disc
osteophyte complexes. Vacuum phenomenon is noted at C5-C6.

Upper chest: Mild interstitial prominence is noted at the lung
apices. Calcification is seen at the carotid bifurcations
bilaterally, with likely mild to moderate luminal narrowing
bilaterally. Hypodensities within the thyroid gland are nonspecific,
without evidence of a dominant mass.

There is rightward deviation of the trachea from the right
innominate artery.

Other: No additional soft tissue abnormalities are seen.
IMPRESSION: 1. No evidence of traumatic intracranial injury or fracture.
2. No evidence of acute fracture or subluxation along the cervical
spine.
3. Soft tissue swelling overlying the left frontal and parietal
calvarium, with associated laceration.
4. Mild cortical volume loss and scattered small vessel ischemic
microangiopathy.
5. Mild chronic encephalomalacia at the left frontoparietal region,
likely reflecting remote infarct.
6. Mild diffuse degenerative change along the cervical spine.
7. Mild interstitial prominence at the lung apices, possibly
reflecting transient interstitial edema.
8. Calcification at the carotid bifurcations, with likely mild to
moderate luminal narrowing bilaterally. Carotid ultrasound would be
helpful for further evaluation, when and as deemed clinically
appropriate.

## 2017-01-03 ENCOUNTER — Ambulatory Visit: Payer: Self-pay | Admitting: Cardiothoracic Surgery

## 2017-01-05 DIAGNOSIS — J449 Chronic obstructive pulmonary disease, unspecified: Secondary | ICD-10-CM | POA: Diagnosis not present

## 2017-01-05 DIAGNOSIS — R05 Cough: Secondary | ICD-10-CM | POA: Diagnosis not present

## 2017-01-07 ENCOUNTER — Ambulatory Visit: Payer: Self-pay | Admitting: Orthopedic Surgery

## 2017-01-11 ENCOUNTER — Emergency Department (HOSPITAL_COMMUNITY): Payer: Medicare Other

## 2017-01-11 ENCOUNTER — Inpatient Hospital Stay (HOSPITAL_COMMUNITY)
Admission: EM | Admit: 2017-01-11 | Discharge: 2017-01-13 | DRG: 193 | Disposition: A | Discharge status: Skilled Nursing Facility | Payer: Medicare Other | Attending: Nephrology | Admitting: Nephrology

## 2017-01-11 ENCOUNTER — Encounter (HOSPITAL_COMMUNITY): Payer: Self-pay | Admitting: Emergency Medicine

## 2017-01-11 DIAGNOSIS — J9601 Acute respiratory failure with hypoxia: Secondary | ICD-10-CM

## 2017-01-11 DIAGNOSIS — I10 Essential (primary) hypertension: Secondary | ICD-10-CM | POA: Diagnosis not present

## 2017-01-11 DIAGNOSIS — E871 Hypo-osmolality and hyponatremia: Secondary | ICD-10-CM

## 2017-01-11 DIAGNOSIS — R296 Repeated falls: Secondary | ICD-10-CM | POA: Diagnosis not present

## 2017-01-11 DIAGNOSIS — R0602 Shortness of breath: Secondary | ICD-10-CM | POA: Diagnosis not present

## 2017-01-11 DIAGNOSIS — J121 Respiratory syncytial virus pneumonia: Secondary | ICD-10-CM | POA: Diagnosis not present

## 2017-01-11 DIAGNOSIS — E039 Hypothyroidism, unspecified: Secondary | ICD-10-CM | POA: Diagnosis not present

## 2017-01-11 DIAGNOSIS — J210 Acute bronchiolitis due to respiratory syncytial virus: Secondary | ICD-10-CM

## 2017-01-11 DIAGNOSIS — F17200 Nicotine dependence, unspecified, uncomplicated: Secondary | ICD-10-CM | POA: Diagnosis present

## 2017-01-11 DIAGNOSIS — Z90710 Acquired absence of both cervix and uterus: Secondary | ICD-10-CM

## 2017-01-11 DIAGNOSIS — J189 Pneumonia, unspecified organism: Secondary | ICD-10-CM | POA: Diagnosis not present

## 2017-01-11 DIAGNOSIS — Z79899 Other long term (current) drug therapy: Secondary | ICD-10-CM | POA: Diagnosis not present

## 2017-01-11 DIAGNOSIS — Z91040 Latex allergy status: Secondary | ICD-10-CM | POA: Diagnosis not present

## 2017-01-11 DIAGNOSIS — J441 Chronic obstructive pulmonary disease with (acute) exacerbation: Secondary | ICD-10-CM | POA: Diagnosis not present

## 2017-01-11 DIAGNOSIS — J449 Chronic obstructive pulmonary disease, unspecified: Secondary | ICD-10-CM | POA: Diagnosis not present

## 2017-01-11 DIAGNOSIS — F418 Other specified anxiety disorders: Secondary | ICD-10-CM | POA: Diagnosis present

## 2017-01-11 DIAGNOSIS — R05 Cough: Secondary | ICD-10-CM | POA: Diagnosis not present

## 2017-01-11 HISTORY — DX: Repeated falls: R29.6

## 2017-01-11 HISTORY — DX: Hypo-osmolality and hyponatremia: E87.1

## 2017-01-11 LAB — BASIC METABOLIC PANEL
Anion gap: 7 (ref 5–15)
BUN: 20 mg/dL (ref 6–20)
CHLORIDE: 90 mmol/L — AB (ref 101–111)
CO2: 28 mmol/L (ref 22–32)
Calcium: 8.4 mg/dL — ABNORMAL LOW (ref 8.9–10.3)
Creatinine, Ser: 1.05 mg/dL — ABNORMAL HIGH (ref 0.44–1.00)
GFR calc non Af Amer: 52 mL/min — ABNORMAL LOW (ref >60)
Glucose, Bld: 160 mg/dL — ABNORMAL HIGH (ref 65–99)
POTASSIUM: 4.1 mmol/L (ref 3.5–5.1)
SODIUM: 125 mmol/L — AB (ref 135–145)

## 2017-01-11 LAB — CBC WITH DIFFERENTIAL/PLATELET
Basophils Absolute: 0 10*3/uL (ref 0.0–0.1)
Basophils Relative: 0 %
Eosinophils Absolute: 0 10*3/uL (ref 0.0–0.7)
Eosinophils Relative: 0 %
HEMATOCRIT: 36.5 % (ref 36.0–46.0)
Hemoglobin: 13 g/dL (ref 12.0–15.0)
LYMPHS ABS: 0.9 10*3/uL (ref 0.7–4.0)
LYMPHS PCT: 9 %
MCH: 33.2 pg (ref 26.0–34.0)
MCHC: 35.6 g/dL (ref 30.0–36.0)
MCV: 93.1 fL (ref 78.0–100.0)
Monocytes Absolute: 0.7 10*3/uL (ref 0.1–1.0)
Monocytes Relative: 7 %
NEUTROS PCT: 84 %
Neutro Abs: 8.6 10*3/uL — ABNORMAL HIGH (ref 1.7–7.7)
Platelets: 180 10*3/uL (ref 150–400)
RBC: 3.92 MIL/uL (ref 3.87–5.11)
RDW: 12.5 % (ref 11.5–15.5)
WBC: 10.2 10*3/uL (ref 4.0–10.5)

## 2017-01-11 LAB — LACTIC ACID, PLASMA: Lactic Acid, Venous: 2.1 mmol/L (ref 0.5–1.9)

## 2017-01-11 MED ORDER — DEXTROSE 5 % IV SOLN
1.0000 g | Freq: Once | INTRAVENOUS | Status: AC
  Administered 2017-01-11: 1 g via INTRAVENOUS
  Filled 2017-01-11: qty 1

## 2017-01-11 MED ORDER — ALBUTEROL SULFATE (2.5 MG/3ML) 0.083% IN NEBU
2.5000 mg | INHALATION_SOLUTION | RESPIRATORY_TRACT | Status: AC
  Administered 2017-01-11: 2.5 mg via RESPIRATORY_TRACT
  Filled 2017-01-11: qty 3

## 2017-01-11 MED ORDER — ENOXAPARIN SODIUM 40 MG/0.4ML ~~LOC~~ SOLN
40.0000 mg | SUBCUTANEOUS | Status: DC
  Administered 2017-01-11 – 2017-01-12 (×2): 40 mg via SUBCUTANEOUS
  Filled 2017-01-11 (×2): qty 0.4

## 2017-01-11 MED ORDER — METHYLPREDNISOLONE SODIUM SUCC 125 MG IJ SOLR
125.0000 mg | Freq: Once | INTRAMUSCULAR | Status: AC
  Administered 2017-01-11: 125 mg via INTRAVENOUS
  Filled 2017-01-11: qty 2

## 2017-01-11 MED ORDER — VANCOMYCIN HCL IN DEXTROSE 1-5 GM/200ML-% IV SOLN
1000.0000 mg | Freq: Once | INTRAVENOUS | Status: AC
  Administered 2017-01-11: 1000 mg via INTRAVENOUS
  Filled 2017-01-11: qty 200

## 2017-01-11 MED ORDER — POTASSIUM CHLORIDE IN NACL 20-0.9 MEQ/L-% IV SOLN
INTRAVENOUS | Status: DC
  Administered 2017-01-11 – 2017-01-12 (×2): via INTRAVENOUS

## 2017-01-11 MED ORDER — PANTOPRAZOLE SODIUM 40 MG PO TBEC: 40.0000 mg | DELAYED_RELEASE_TABLET | Freq: Every day | ORAL | Status: DC | PRN

## 2017-01-11 MED ORDER — ALPRAZOLAM 1 MG PO TABS
1.0000 mg | ORAL_TABLET | Freq: Two times a day (BID) | ORAL | Status: DC | PRN
  Administered 2017-01-11 – 2017-01-13 (×3): 1 mg via ORAL
  Filled 2017-01-11 (×3): qty 1

## 2017-01-11 MED ORDER — VANCOMYCIN HCL IN DEXTROSE 750-5 MG/150ML-% IV SOLN
750.0000 mg | INTRAVENOUS | Status: DC
  Administered 2017-01-12: 750 mg via INTRAVENOUS
  Filled 2017-01-11 (×3): qty 150

## 2017-01-11 MED ORDER — AMITRIPTYLINE HCL 25 MG PO TABS
100.0000 mg | ORAL_TABLET | Freq: Every day | ORAL | Status: DC
  Administered 2017-01-11 – 2017-01-12 (×2): 100 mg via ORAL
  Filled 2017-01-11 (×2): qty 2
  Filled 2017-01-11 (×2): qty 4
  Filled 2017-01-11: qty 2

## 2017-01-11 MED ORDER — PREDNISONE 50 MG PO TABS
50.0000 mg | ORAL_TABLET | Freq: Every day | ORAL | Status: DC
  Administered 2017-01-12 – 2017-01-13 (×2): 50 mg via ORAL
  Filled 2017-01-11 (×3): qty 1

## 2017-01-11 MED ORDER — ALBUTEROL (5 MG/ML) CONTINUOUS INHALATION SOLN
10.0000 mg/h | INHALATION_SOLUTION | Freq: Once | RESPIRATORY_TRACT | Status: AC
  Administered 2017-01-11: 10 mg/h via RESPIRATORY_TRACT
  Filled 2017-01-11: qty 20

## 2017-01-11 MED ORDER — IPRATROPIUM-ALBUTEROL 0.5-2.5 (3) MG/3ML IN SOLN
3.0000 mL | Freq: Four times a day (QID) | RESPIRATORY_TRACT | Status: DC
  Administered 2017-01-12 (×4): 3 mL via RESPIRATORY_TRACT
  Filled 2017-01-11 (×5): qty 3

## 2017-01-11 MED ORDER — ACETAMINOPHEN 325 MG PO TABS
650.0000 mg | ORAL_TABLET | Freq: Four times a day (QID) | ORAL | Status: DC | PRN
  Administered 2017-01-12 – 2017-01-13 (×2): 650 mg via ORAL
  Filled 2017-01-11 (×2): qty 2

## 2017-01-11 MED ORDER — HYDROCODONE-ACETAMINOPHEN 5-325 MG PO TABS
1.0000 | ORAL_TABLET | Freq: Four times a day (QID) | ORAL | Status: DC | PRN
  Administered 2017-01-11 – 2017-01-13 (×6): 1 via ORAL
  Filled 2017-01-11 (×6): qty 1

## 2017-01-11 MED ORDER — VANCOMYCIN HCL IN DEXTROSE 750-5 MG/150ML-% IV SOLN
750.0000 mg | INTRAVENOUS | Status: DC
  Filled 2017-01-11 (×3): qty 150

## 2017-01-11 MED ORDER — LEVOTHYROXINE SODIUM 25 MCG PO TABS
25.0000 ug | ORAL_TABLET | Freq: Every day | ORAL | Status: DC
  Administered 2017-01-12 – 2017-01-13 (×2): 25 ug via ORAL
  Filled 2017-01-11 (×2): qty 1

## 2017-01-11 MED ORDER — IPRATROPIUM-ALBUTEROL 0.5-2.5 (3) MG/3ML IN SOLN
3.0000 mL | Freq: Once | RESPIRATORY_TRACT | Status: DC
  Filled 2017-01-11: qty 3

## 2017-01-11 MED ORDER — DEXTROSE 5 % IV SOLN
1.0000 g | INTRAVENOUS | Status: DC
  Administered 2017-01-11: 1 g via INTRAVENOUS
  Filled 2017-01-11 (×3): qty 1

## 2017-01-11 MED ORDER — DM-GUAIFENESIN ER 30-600 MG PO TB12
1.0000 | ORAL_TABLET | Freq: Two times a day (BID) | ORAL | Status: DC
  Administered 2017-01-11 – 2017-01-13 (×6): 1 via ORAL
  Filled 2017-01-11 (×6): qty 1

## 2017-01-11 MED ORDER — ONDANSETRON HCL 4 MG/2ML IJ SOLN
4.0000 mg | Freq: Four times a day (QID) | INTRAMUSCULAR | Status: DC | PRN
Start: 2017-01-11 — End: 2017-01-13

## 2017-01-11 MED ORDER — IPRATROPIUM BROMIDE 0.02 % IN SOLN
1.0000 mg | Freq: Once | RESPIRATORY_TRACT | Status: AC
  Administered 2017-01-11: 1 mg via RESPIRATORY_TRACT
  Filled 2017-01-11: qty 5

## 2017-01-11 MED ORDER — FLUOXETINE HCL 20 MG PO CAPS
20.0000 mg | ORAL_CAPSULE | Freq: Every day | ORAL | Status: DC
  Administered 2017-01-12 – 2017-01-13 (×2): 20 mg via ORAL
  Filled 2017-01-11 (×2): qty 1

## 2017-01-11 MED ORDER — ORAL CARE MOUTH RINSE
15.0000 mL | Freq: Two times a day (BID) | OROMUCOSAL | Status: DC
  Administered 2017-01-11 – 2017-01-13 (×4): 15 mL via OROMUCOSAL

## 2017-01-11 MED ORDER — LOSARTAN POTASSIUM 50 MG PO TABS
50.0000 mg | ORAL_TABLET | Freq: Every day | ORAL | Status: DC
  Administered 2017-01-12 – 2017-01-13 (×2): 50 mg via ORAL
  Filled 2017-01-11 (×2): qty 1

## 2017-01-11 NOTE — Progress Notes (Signed)
Pharmacy Antibiotic Note  Rachel Dodson is a 71 y.o. female admitted on 01/11/2017 with pneumonia.  Pharmacy has been consulted for VANCOMYCIN dosing.  Plan: Vancomycin 1000mg  x 1 then 750mg  IV q24hrs Monitor labs, progress, c/s  Height: 5\' 3"  (160 cm) Weight: 120 lb (54.4 kg) IBW/kg (Calculated) : 52.4  Temp (24hrs), Avg:98.1 F (36.7 C), Min:98.1 F (36.7 C), Max:98.1 F (36.7 C)   Recent Labs Lab 01/11/17 1026 01/11/17 1205  WBC 10.2  --   CREATININE 1.05*  --   LATICACIDVEN  --  2.1*    Estimated Creatinine Clearance: 40.7 mL/min (by C-G formula based on SCr of 1.05 mg/dL (H)).    Allergies  Allergen Reactions  . Latex    Antimicrobials this admission: Vancomycin 1/19 >>  Cefepime 1/19 >>   Dose adjustments this admission:  Microbiology results:  BCx: pending  UCx: pending   Sputum:    MRSA PCR:   Thank you for allowing pharmacy to be a part of this patient's care.  Hart Robinsons A 01/11/2017 1:38 PM

## 2017-01-11 NOTE — ED Provider Notes (Signed)
[facility] DEPT Provider Note   CSN: [id number] Arrival date & time: 01/11/17  [id number]     History   Chief Complaint Chief Complaint  Patient presents with  . Cough    HPI Rachel Dodson is a 71 y.o. female.  HPI  Pt was seen at 1010.  Per pt, c/o gradual onset and worsening of persistent cough, wheezing and SOB for the past 1 week, worse over the past several days. Pt states she is on her last day of a zpack. Unknown fevers. Denies CP/palpitations, no back pain, no abd pain, no N/V/D, no rash.    Past Medical History:  Diagnosis Date  . Anxiety   . Chronic hyponatremia   . Depression   . Hypertension   . Recurrent falls     Patient Active Problem List   Diagnosis Date Noted  . Right humeral fracture 10/18/2016  . Laceration of fingers without complication [id number]  . Hypokalemia 01/28/2014  . ARF (acute renal failure) ([place]) 01/25/2014  . Rhabdomyolysis 01/25/2014  . Dehydration 01/25/2014  . Fall 01/25/2014  . Dementia 01/25/2014  . COPD  GOLD II 01/08/2014  . Hyponatremia 11/10/2013  . Orthostatic hypotension 10/23/2013  . Leukocytosis, unspecified 10/23/2013  . Acute respiratory failure ([place]) 10/23/2013  . Lung nodule 10/20/2013  . UTI (urinary tract infection) 10/20/2013    Past Surgical History:  Procedure Laterality Date  . ABDOMINAL HYSTERECTOMY    . VIDEO ASSISTED THORACOSCOPY Left 10/23/2013   Procedure: VIDEO ASSISTED THORACOSCOPY;  Surgeon: Grace Isaac, MD;  Location: [facility];  Service: Thoracic;  Laterality: Left;  Marland Kitchen VIDEO BRONCHOSCOPY N/A 10/23/2013   Procedure: VIDEO BRONCHOSCOPY;  Surgeon: Grace Isaac, MD;  Location: [facility] OR;  Service: Thoracic;  Laterality: N/A;    OB History    No data available       Home Medications    Prior to Admission medications   Medication Sig Start Date End Date Taking? Authorizing Provider  ALPRAZolam Duanne Moron) 1 MG tablet Take 1 tablet (1 mg total) by mouth 2 (two) times daily as needed for  anxiety. 10/19/16   Verlee Monte, MD  amitriptyline (ELAVIL) 100 MG tablet Take 1 tablet (100 mg total) by mouth at bedtime. 10/19/16   Verlee Monte, MD  FLUoxetine (PROZAC) 20 MG capsule Take 1 capsule (20 mg total) by mouth daily. 10/19/16   Verlee Monte, MD  HYDROcodone-acetaminophen (NORCO/VICODIN) 5-325 MG tablet Take 1 tablet by mouth every 6 (six) hours as needed for severe pain. 10/19/16   Verlee Monte, MD  levothyroxine (SYNTHROID, LEVOTHROID) 25 MCG tablet Take 1 tablet (25 mcg total) by mouth daily before breakfast. 10/19/16   Verlee Monte, MD  losartan (COZAAR) 50 MG tablet Take 1 tablet (50 mg total) by mouth daily. 10/19/16   Verlee Monte, MD  pantoprazole (PROTONIX) 40 MG tablet Take 1 tablet (40 mg total) by mouth daily as needed (for acid reflux). 10/19/16   Verlee Monte, MD    Family History History reviewed. No pertinent family history.  Social History Social History  Substance Use Topics  . Smoking status: Current Every Day Smoker    Packs/day: 0.25    Years: 15.00    Types: Cigarettes  . Smokeless tobacco: Never Used  . Alcohol use No     Allergies   Latex   Review of Systems Review of Systems ROS: Statement: All systems negative except as marked or noted in the HPI; Constitutional: Negative for fever and chills. ; ; Eyes: Negative for  eye pain, redness and discharge. ; ; ENMT: Negative for ear pain, hoarseness, nasal congestion, sinus pressure and sore throat. ; ; Cardiovascular: Negative for chest pain, palpitations, diaphoresis, and peripheral edema. ; ; Respiratory: +cough, wheezing, SOB. Negative for stridor. ; ; Gastrointestinal: Negative for nausea, vomiting, diarrhea, abdominal pain, blood in stool, hematemesis, jaundice and rectal bleeding. . ; ; Genitourinary: Negative for dysuria, flank pain and hematuria. ; ; Musculoskeletal: Negative for back pain and neck pain. Negative for swelling and trauma.; ; Skin: Negative for pruritus, rash, abrasions, blisters,  bruising and skin lesion.; ; Neuro: Negative for headache, lightheadedness and neck stiffness. Negative for weakness, altered level of consciousness, altered mental status, extremity weakness, paresthesias, involuntary movement, seizure and syncope.       Physical Exam Updated Vital Signs BP (!) 116/53   Pulse 96   Temp 98.1 F (36.7 C) (Oral)   Resp 20   Ht 5\' 3"  (1.6 m)   Wt 120 lb (54.4 kg)   SpO2 93%   BMI 21.26 kg/m    Patient Vitals for the past 24 hrs:  BP Temp Temp src Pulse Resp SpO2 Height Weight  01/11/17 1315 - - - 103 - 99 % - -  01/11/17 1300 118/55 - - 99 - 95 % - -  01/11/17 1255 - - - 103 - 95 % - -  01/11/17 1250 - - - 103 - 99 % - -  01/11/17 1230 (!) 116/53 - - - - - - -  01/11/17 1200 (!) 111/54 - - - - - - -  01/11/17 1130 104/55 - - - - - - -  01/11/17 1100 111/56 - - - - - - -  01/11/17 1058 - - - 96 - 93 % - -  01/11/17 1033 129/66 - - - - - - -  01/11/17 1031 - - - - - 95 % - -  01/11/17 1001 125/62 98.1 F (36.7 C) Oral 95 20 (!) 89 % - -  01/11/17 1000 - - - - - - 5\' 3"  (1.6 m) 120 lb (54.4 kg)     Physical Exam 1015: Physical examination:  Nursing notes reviewed; Vital signs and O2 SAT reviewed;  Constitutional: Well developed, Well nourished, Well hydrated, Uncomfortable appearing.;; Head:  Normocephalic, atraumatic; Eyes: EOMI, PERRL, No scleral icterus; ENMT: Mouth and pharynx normal, Mucous membranes moist; Neck: Supple, Full range of motion, No lymphadenopathy; Cardiovascular: Tachycardic rate and rhythm, No gallop; Respiratory: Breath sounds diminished & equal bilaterally, insp/exp wheezes bilat. Faint audible wheezing.  Speaking short sentences, sitting upright, tachypneic.; Chest: Nontender, Movement normal; Abdomen: Soft, Nontender, Nondistended, Normal bowel sounds; Genitourinary: No CVA tenderness; Extremities: Pulses normal, No tenderness, No edema, No calf edema or asymmetry.; Neuro: AA&Ox3, Major CN grossly intact.  Speech clear. No  gross focal motor or sensory deficits in extremities.; Skin: Color normal, Warm, Dry.   ED Treatments / Results  Labs (all labs ordered are listed, but only abnormal results are displayed)   EKG  EKG Interpretation None       Radiology   Procedures Procedures (including critical care time)  Medications Ordered in ED Medications  methylPREDNISolone sodium succinate (SOLU-MEDROL) 125 mg/2 mL injection 125 mg (125 mg Intravenous Given 01/11/17 1034)  albuterol (PROVENTIL,VENTOLIN) solution continuous neb (10 mg/hr Nebulization Given 01/11/17 1029)  ipratropium (ATROVENT) nebulizer solution 1 mg (1 mg Nebulization Given 01/11/17 1029)     Initial Impression / Assessment and Plan / ED Course  I have reviewed the  triage vital signs and the nursing notes.  Pertinent labs & imaging results that were available during my care of the patient were reviewed by me and considered in my medical decision making (see chart for details).    MDM Reviewed: previous chart, nursing note and vitals Reviewed previous: labs Interpretation: labs and x-ray Total time providing critical care: 30-74 minutes. This excludes time spent performing separately reportable procedures and services. Consults: admitting MD   CRITICAL CARE Performed by: Alfonzo Feller Total critical care time: 35 minutes Critical care time was exclusive of separately billable procedures and treating other patients. Critical care was necessary to treat or prevent imminent or life-threatening deterioration. Critical care was time spent personally by me on the following activities: development of treatment plan with patient and/or surrogate as well as nursing, discussions with consultants, evaluation of patient's response to treatment, examination of patient, obtaining history from patient or surrogate, ordering and performing treatments and interventions, ordering and review of laboratory studies, ordering and review of  radiographic studies, pulse oximetry and re-evaluation of patient's condition.  Results for orders placed or performed during the hospital encounter of 0000000  Basic metabolic panel  Result Value Ref Range   Sodium 125 (L) 135 - 145 mmol/L   Potassium 4.1 3.5 - 5.1 mmol/L   Chloride 90 (L) 101 - 111 mmol/L   CO2 28 22 - 32 mmol/L   Glucose, Bld 160 (H) 65 - 99 mg/dL   BUN 20 6 - 20 mg/dL   Creatinine, Ser 1.05 (H) 0.44 - 1.00 mg/dL   Calcium 8.4 (L) 8.9 - 10.3 mg/dL   GFR calc non Af Amer 52 (L) >60 mL/min   GFR calc Af Amer >60 >60 mL/min   Anion gap 7 5 - 15  CBC with Differential  Result Value Ref Range   WBC 10.2 4.0 - 10.5 K/uL   RBC 3.92 3.87 - 5.11 MIL/uL   Hemoglobin 13.0 12.0 - 15.0 g/dL   HCT 36.5 36.0 - 46.0 %   MCV 93.1 78.0 - 100.0 fL   MCH 33.2 26.0 - 34.0 pg   MCHC 35.6 30.0 - 36.0 g/dL   RDW 12.5 11.5 - 15.5 %   Platelets 180 150 - 400 K/uL   Neutrophils Relative % 84 %   Neutro Abs 8.6 (H) 1.7 - 7.7 K/uL   Lymphocytes Relative 9 %   Lymphs Abs 0.9 0.7 - 4.0 K/uL   Monocytes Relative 7 %   Monocytes Absolute 0.7 0.1 - 1.0 K/uL   Eosinophils Relative 0 %   Eosinophils Absolute 0.0 0.0 - 0.7 K/uL   Basophils Relative 0 %   Basophils Absolute 0.0 0.0 - 0.1 K/uL  Lactic acid, plasma  Result Value Ref Range   Lactic Acid, Venous 2.1 (HH) 0.5 - 1.9 mmol/L   Dg Chest Port 1 View Result Date: 01/11/2017 CLINICAL DATA:  Cough, chest congestion and shortness of breath over the last week. EXAM: PORTABLE CHEST 1 VIEW COMPARISON:  10/16/2016 FINDINGS: Chronic cardiomegaly. Chronic aortic atherosclerosis. Chronic large hiatal hernia responsible for the lower chest density right more than left. There may be pulmonary venous hypertension. Patchy pulmonary density bilaterally could represent mild pneumonia. No dense consolidation or lobar collapse. No effusion. Consider two-view chest radiography when able. Previously seen pulmonary nodule on the right not discernible. Old  healed right proximal humeral fracture. IMPRESSION: Chronic cardiomegaly, aortic atherosclerosis and large hiatal hernia. Worsened patchy pulmonary density bilaterally could represent mild pneumonia. Consider two-view chest radiography when  able. Previously seen medial right lung nodule not visible on this image. Electronically Signed   By: Nelson Chimes M.D.   On: 01/11/2017 11:37     Results for EMMILYN, REVERON (MRN [id number]) as of 01/11/2017 12:37  Ref. Range 10/16/2016 21:17 10/17/2016 02:32 10/19/2016 02:58 11/19/2016 13:16 01/11/2017 10:26  Sodium Latest Ref Range: 135 - 145 mmol/L 124 (L) 125 (L) 127 (L) 132 (L) 125 (L)     1315:  On arrival: pt sitting upright, tachypneic, tachycardic, Sats 89 % R/A, lungs diminished. IV solumedrol and hour long neb started. After neb: pt appears more comfortable at rest, less tachypneic, Sats 95 % R/A, lungs continue diminished. Pt fell asleep with O2 Sats dropping to 82% R/A. Pt ambulated in hallway: pt's O2 Sats dropped to 88 % R/A with pt c/o increasing SOB, with increasing HR and RR. Pt escorted back to stretcher, O2 2L N/C applied with O2 Sats increasing to 99%. Will tx for HCAP. T/C to [facility]. Carolin Sicks, case discussed, including:  HPI, pertinent PM/SHx, VS/PE, dx testing, ED course and treatment:  Agreeable to admit, requests to write temporary orders, obtain inpt medical bed to team APAdmits.              Final Clinical Impressions(s) / ED Diagnoses   Final diagnoses:  None    New Prescriptions New Prescriptions   No medications on file     Francine Graven, DO 01/13/17 1809

## 2017-01-11 NOTE — ED Notes (Signed)
Pt's sats in low 80's while resting in bed.  O2 applied via [place] at 3 L/M.  sats increased to 94 % with O2 on.

## 2017-01-11 NOTE — ED Notes (Signed)
Called to give report, was told that pt is not going to assigned room 304 and for me to call back in few minutes.

## 2017-01-11 NOTE — Progress Notes (Signed)
PHARMACY NOTE:  ANTIMICROBIAL RENAL DOSAGE ADJUSTMENT  Current antimicrobial regimen includes a mismatch between antimicrobial dosage and estimated renal function.  As per policy approved by the Pharmacy & Therapeutics and Medical Executive Committees, the antimicrobial dosage will be adjusted accordingly.  Current antimicrobial dosage:  Cefepime 1 GM IV every 8 hours  Indication: HCAP  Renal Function:  Estimated Creatinine Clearance: 40.7 mL/min (by C-G formula based on SCr of 1.05 mg/dL (H)). []      On intermittent HD, scheduled: []      On CRRT    Antimicrobial dosage has been changed to:  Cefepime 1 GM IV every 24 hours  Additional comments:   Thank you for allowing pharmacy to be a part of this patient's care.  Gildardo Griffes [place], [facility] 01/11/2017 3:26 PM

## 2017-01-11 NOTE — ED Notes (Signed)
CRITICAL VALUE ALERT  Critical value received:  Lactic Acid 2.1  Date of notification:  01/11/17  Time of notification:  [id number]  Critical value read back:Yes.    Nurse who received alert:  B. Lina Sayre  MD notified (1st page):  Thurnell Garbe  Time of first page:  [phone]  MD notified (2nd page):  Time of second page:  Responding MD:  [place]  Time MD responded:  [id number]

## 2017-01-11 NOTE — H&P (Signed)
History and Physical    Rachel Dodson [id number] DOB: Dec 07, 1946 DOA: 01/11/2017  PCP: Glo Herring., MD  Patient coming from:  Chief Complaint:cough, SOB  HPI: Rachel Dodson is a 71 y.o. female with medical history significant of anxiety, chronic hyponatremia, depression, hypertension, active smoker, possible COPD as per chart, and recurrent falls at nursing home presented with shortness of breath, wheezing and persistent cough for about a week. Patient completed a course of azithromycin, last dose was today. She did feel better however the cough worsened therefore came to the hospital from nursing home. Reported chills but no fever. Denied headache, dizziness. The cough is dry associated with runny nose, wheezing and shortness of breath. Denied chest pain, nausea, vomiting, abdominal pain, diarrhea or constipation, dysuria or urgency or frequency. ED Course: X-ray consistent with pneumonia. Treated with vancomycin and cefepime. Admitted for further evaluation. Patient was hypoxic in the ER required 2 L of oxygen.  Review of Systems: As per HPI otherwise 10 point review of systems negative.    Past Medical History:  Diagnosis Date  . Anxiety   . Chronic hyponatremia   . Depression   . Hypertension   . Recurrent falls     Past Surgical History:  Procedure Laterality Date  . ABDOMINAL HYSTERECTOMY    . VIDEO ASSISTED THORACOSCOPY Left 10/23/2013   Procedure: VIDEO ASSISTED THORACOSCOPY;  Surgeon: Grace Isaac, MD;  Location: [facility];  Service: Thoracic;  Laterality: Left;  Marland Kitchen VIDEO BRONCHOSCOPY N/A 10/23/2013   Procedure: VIDEO BRONCHOSCOPY;  Surgeon: Grace Isaac, MD;  Location: [place];  Service: Thoracic;  Laterality: N/A;    Social history. reports that she has been smoking Cigarettes.  She has a 3.75 pack-year smoking history. She has never used smokeless tobacco. She reports that she does not drink alcohol or use drugs.  Allergies  Allergen Reactions  . Latex      History reviewed. No pertinent family history.   Prior to Admission medications   Medication Sig Start Date End Date Taking? Authorizing Provider  ALPRAZolam Duanne Moron) 1 MG tablet Take 1 tablet (1 mg total) by mouth 2 (two) times daily as needed for anxiety. 10/19/16   Verlee Monte, MD  amitriptyline (ELAVIL) 100 MG tablet Take 1 tablet (100 mg total) by mouth at bedtime. 10/19/16   Verlee Monte, MD  FLUoxetine (PROZAC) 20 MG capsule Take 1 capsule (20 mg total) by mouth daily. 10/19/16   Verlee Monte, MD  HYDROcodone-acetaminophen (NORCO/VICODIN) 5-325 MG tablet Take 1 tablet by mouth every 6 (six) hours as needed for severe pain. 10/19/16   Verlee Monte, MD  levothyroxine (SYNTHROID, LEVOTHROID) 25 MCG tablet Take 1 tablet (25 mcg total) by mouth daily before breakfast. 10/19/16   Verlee Monte, MD  losartan (COZAAR) 50 MG tablet Take 1 tablet (50 mg total) by mouth daily. 10/19/16   Verlee Monte, MD  pantoprazole (PROTONIX) 40 MG tablet Take 1 tablet (40 mg total) by mouth daily as needed (for acid reflux). 10/19/16   Verlee Monte, MD    Physical Exam: Vitals:   01/11/17 1250 01/11/17 1255 01/11/17 1300 01/11/17 1315  BP:   118/55   Pulse: 103 103 99 103  Resp:      Temp:      TempSrc:      SpO2: 99% 95% 95% 99%  Weight:      Height:          Constitutional: NAD, calm, comfortable Vitals:   01/11/17 1250 01/11/17 1255 01/11/17  1300 01/11/17 1315  BP:   118/55   Pulse: 103 103 99 103  Resp:      Temp:      TempSrc:      SpO2: 99% 95% 95% 99%  Weight:      Height:       Eyes: PERRL ENMT: Mucous membranes are moist.   Neck: normal, supple,  Respiratory:  Normal respiratory effort. No accessory muscle use. Bibasal crackles and diffuse expiratory wheeze Cardiovascular: Regular rate and rhythm, no murmurs. No extremity edema.  Abdomen: no tenderness. Bowel sounds positive. Soft, nontender Musculoskeletal: no clubbing / cyanosis.  Skin: no rashes, lesions, ulcers. No  induration Neurologic: CN 2-12 grossly intact. Strength 5/5 in all 4.  Psychiatric: Normal judgment and insight. Alert and oriented x 3. Normal mood.    Labs on Admission: I have personally reviewed following labs and imaging studies  CBC:  Recent Labs Lab 01/11/17 1026  WBC 10.2  NEUTROABS 8.6*  HGB 13.0  HCT 36.5  MCV 93.1  PLT 99991111   Basic Metabolic Panel:  Recent Labs Lab 01/11/17 1026  NA 125*  K 4.1  CL 90*  CO2 28  GLUCOSE 160*  BUN 20  CREATININE 1.05*  CALCIUM 8.4*   GFR: Estimated Creatinine Clearance: 40.7 mL/min (by C-G formula based on SCr of 1.05 mg/dL (H)). Liver Function Tests: No results for input(s): AST, ALT, ALKPHOS, BILITOT, PROT, ALBUMIN in the last 168 hours. No results for input(s): LIPASE, AMYLASE in the last 168 hours. No results for input(s): AMMONIA in the last 168 hours. Coagulation Profile: No results for input(s): INR, PROTIME in the last 168 hours. Cardiac Enzymes: No results for input(s): CKTOTAL, CKMB, CKMBINDEX, TROPONINI in the last 168 hours. BNP (last 3 results) No results for input(s): PROBNP in the last 8760 hours. HbA1C: No results for input(s): HGBA1C in the last 72 hours. CBG: No results for input(s): GLUCAP in the last 168 hours. Lipid Profile: No results for input(s): CHOL, HDL, LDLCALC, TRIG, CHOLHDL, LDLDIRECT in the last 72 hours. Thyroid Function Tests: No results for input(s): TSH, T4TOTAL, FREET4, T3FREE, THYROIDAB in the last 72 hours. Anemia Panel: No results for input(s): VITAMINB12, FOLATE, FERRITIN, TIBC, IRON, RETICCTPCT in the last 72 hours. Urine analysis:    Component Value Date/Time   COLORURINE YELLOW 10/16/2016 1928   APPEARANCEUR CLEAR 10/16/2016 1928   LABSPEC 1.010 10/16/2016 1928   PHURINE 6.0 10/16/2016 1928   GLUCOSEU NEGATIVE 10/16/2016 1928   HGBUR NEGATIVE 10/16/2016 1928   BILIRUBINUR NEGATIVE 10/16/2016 1928   KETONESUR NEGATIVE 10/16/2016 1928   PROTEINUR NEGATIVE 10/16/2016  1928   UROBILINOGEN 0.2 01/29/2014 1642   NITRITE NEGATIVE 10/16/2016 1928   LEUKOCYTESUR SMALL (A) 10/16/2016 1928   Sepsis Labs: !!!!!!!!!!!!!!!!!!!!!!!!!!!!!!!!!!!!!!!!!!!! @LABRCNTIP (procalcitonin:4,lacticidven:4) )No results found for this or any previous visit (from the past 240 hour(s)).   Radiological Exams on Admission: Dg Chest Port 1 View  Result Date: 01/11/2017 CLINICAL DATA:  Cough, chest congestion and shortness of breath over the last week. EXAM: PORTABLE CHEST 1 VIEW COMPARISON:  10/16/2016 FINDINGS: Chronic cardiomegaly. Chronic aortic atherosclerosis. Chronic large hiatal hernia responsible for the lower chest density right more than left. There may be pulmonary venous hypertension. Patchy pulmonary density bilaterally could represent mild pneumonia. No dense consolidation or lobar collapse. No effusion. Consider two-view chest radiography when able. Previously seen pulmonary nodule on the right not discernible. Old healed right proximal humeral fracture. IMPRESSION: Chronic cardiomegaly, aortic atherosclerosis and large hiatal hernia. Worsened patchy pulmonary density  bilaterally could represent mild pneumonia. Consider two-view chest radiography when able. Previously seen medial right lung nodule not visible on this image. Electronically Signed   By: Nelson Chimes M.D.   On: 01/11/2017 11:37     Assessment/Plan Active Problems:   HCAP (healthcare-associated pneumonia)  #Healthcare associated pneumonia: Patient completed a course of azithromycin for 5 days without improvement. Chest x-ray consistent with right basal pneumonia. Patient with hypoxia, leukocytosis. -Continue vancomycin and cefepime. -Sputum culture -Oxygen therapy, bronchodilators, Mucinex DM ordered -Respiratory therapy consulted, duoneb -Checking respiratory viral panel, droplet precaution -Supportive care with Tylenol, IV fluid  #Acute hypoxic respiratory failure: In the setting of pneumonia and  possible COPD exacerbation. Requiring 2-3 L of oxygen. Continue treatment as discussed above.  #Possible acute COPD exacerbation in the setting of pneumonia: Received IV Solu-Medrol in the ER. Continue oral prednisone, bronchodilators, antibiotics.  #Anxiety depression: Continue home medications.  #Chronic hyponatremia: Exact etiology unknown, may be in the setting of Prozac. I will start the workup with urinalysis, urine osmolarity, urine sodium and potassium. I will start gentle IV hydration with normal saline to see the respond with a repeat lab. Patient may benefit from salt tablet depending on further study.   #Hypothyroidism: Continue Synthroid.  #Hypertension: Continue losartan. Blood pressure acceptable. Continue to monitor.  #Active smoker/tobacco dependence: Education provided to the patient. Patient reported not to smoking for about a week now.  DVT prophylaxis:  Lovenox subcutaneous Code Status:  Full code Family Communication: no family present at bedside Disposition Plan: admitted inpatient. Likely discharge to nursing home in a few days Consults called: none Admission status: inpatient.   Dron Tanna Furry MD [facility] Pager [phone]  If 7PM-7AM, please contact night-coverage [email or web address] Password [email or web address]  01/11/2017, 1:55 PM

## 2017-01-11 NOTE — ED Notes (Signed)
Walking with one person assist. Pt initial o2 95% room air. Decreased to 88% when back to room.

## 2017-01-11 NOTE — ED Triage Notes (Signed)
Pt is a resident from [facility] assisted living and was dropped off for evaluation of cough and wheezing for a week.  Pt states she was on her last day of a zpack.

## 2017-01-12 ENCOUNTER — Inpatient Hospital Stay (HOSPITAL_COMMUNITY): Payer: Medicare Other

## 2017-01-12 DIAGNOSIS — J210 Acute bronchiolitis due to respiratory syncytial virus: Secondary | ICD-10-CM

## 2017-01-12 LAB — BASIC METABOLIC PANEL
ANION GAP: 10 (ref 5–15)
BUN: 19 mg/dL (ref 6–20)
CALCIUM: 9.1 mg/dL (ref 8.9–10.3)
CO2: 22 mmol/L (ref 22–32)
Chloride: 95 mmol/L — ABNORMAL LOW (ref 101–111)
Creatinine, Ser: 1 mg/dL (ref 0.44–1.00)
GFR, EST NON AFRICAN AMERICAN: 55 mL/min — AB (ref >60)
Glucose, Bld: 138 mg/dL — ABNORMAL HIGH (ref 65–99)
Potassium: 5 mmol/L (ref 3.5–5.1)
Sodium: 127 mmol/L — ABNORMAL LOW (ref 135–145)

## 2017-01-12 LAB — RESPIRATORY PANEL BY PCR
Adenovirus: NOT DETECTED
BORDETELLA PERTUSSIS-RVPCR: NOT DETECTED
CHLAMYDOPHILA PNEUMONIAE-RVPPCR: NOT DETECTED
Coronavirus 229E: NOT DETECTED
Coronavirus HKU1: NOT DETECTED
Coronavirus NL63: NOT DETECTED
Coronavirus OC43: NOT DETECTED
INFLUENZA B-RVPPCR: NOT DETECTED
Influenza A: NOT DETECTED
Metapneumovirus: NOT DETECTED
Mycoplasma pneumoniae: NOT DETECTED
Parainfluenza Virus 1: NOT DETECTED
Parainfluenza Virus 2: NOT DETECTED
Parainfluenza Virus 3: NOT DETECTED
Parainfluenza Virus 4: NOT DETECTED
RESPIRATORY SYNCYTIAL VIRUS-RVPPCR: DETECTED — AB
Rhinovirus / Enterovirus: NOT DETECTED

## 2017-01-12 LAB — URINALYSIS, ROUTINE W REFLEX MICROSCOPIC
BILIRUBIN URINE: NEGATIVE
Bacteria, UA: NONE SEEN
Glucose, UA: NEGATIVE mg/dL
HGB URINE DIPSTICK: NEGATIVE
KETONES UR: NEGATIVE mg/dL
NITRITE: NEGATIVE
Protein, ur: NEGATIVE mg/dL
Specific Gravity, Urine: 1.006 (ref 1.005–1.030)
pH: 6 (ref 5.0–8.0)

## 2017-01-12 LAB — CBC
HCT: 41.9 % (ref 36.0–46.0)
HEMOGLOBIN: 14.6 g/dL (ref 12.0–15.0)
MCH: 32.7 pg (ref 26.0–34.0)
MCHC: 34.8 g/dL (ref 30.0–36.0)
MCV: 93.7 fL (ref 78.0–100.0)
PLATELETS: 190 10*3/uL (ref 150–400)
RBC: 4.47 MIL/uL (ref 3.87–5.11)
RDW: 12.6 % (ref 11.5–15.5)
WBC: 9.3 10*3/uL (ref 4.0–10.5)

## 2017-01-12 LAB — NA AND K (SODIUM & POTASSIUM), RAND UR
Potassium Urine: 14 mmol/L
SODIUM UR: 10 mmol/L

## 2017-01-12 LAB — OSMOLALITY, URINE: OSMOLALITY UR: 214 mosm/kg — AB (ref 300–900)

## 2017-01-12 MED ORDER — LEVOFLOXACIN IN D5W 750 MG/150ML IV SOLN
750.0000 mg | INTRAVENOUS | Status: DC
  Administered 2017-01-12: 750 mg via INTRAVENOUS
  Filled 2017-01-12: qty 150

## 2017-01-12 MED ORDER — SODIUM CHLORIDE 1 G PO TABS
1.0000 g | ORAL_TABLET | Freq: Two times a day (BID) | ORAL | Status: DC
  Administered 2017-01-12 – 2017-01-13 (×3): 1 g via ORAL
  Filled 2017-01-12 (×4): qty 1

## 2017-01-12 MED ORDER — SODIUM CHLORIDE 0.9 % IV SOLN
INTRAVENOUS | Status: DC
  Administered 2017-01-12: 17:00:00 via INTRAVENOUS

## 2017-01-12 MED ORDER — VANCOMYCIN HCL 500 MG IV SOLR
500.0000 mg | Freq: Two times a day (BID) | INTRAVENOUS | Status: DC
  Filled 2017-01-12 (×4): qty 500

## 2017-01-12 NOTE — Progress Notes (Signed)
Pharmacy Antibiotic Note  Rachel Dodson is a 71 y.o. female admitted on 01/11/2017 with pneumonia.  Pharmacy has been consulted for Levaquin dosing.  Plan: Levaquin 750 mg IV every 48 hours Monitor renal function  Height: 5\' 3"  (160 cm) Weight: 118 lb 9.6 oz (53.8 kg) IBW/kg (Calculated) : 52.4  Temp (24hrs), Avg:98.3 F (36.8 C), Min:97.9 F (36.6 C), Max:98.8 F (37.1 C)   Recent Labs Lab 01/11/17 1026 01/11/17 1205 01/12/17 0559  WBC 10.2  --  9.3  CREATININE 1.05*  --  1.00  LATICACIDVEN  --  2.1*  --     Estimated Creatinine Clearance: 42.7 mL/min (by C-G formula based on SCr of 1 mg/dL).    Allergies  Allergen Reactions  . Latex     Antimicrobials this admission: Vancomycin 1/19 >> 1/20 Cefepime  1/19 >> 1/20 Levaquin 1/20 >.   Thank you for allowing pharmacy to be a part of this patient's care.  Chriss Czar 01/12/2017 4:17 PM

## 2017-01-12 NOTE — Progress Notes (Signed)
CRITICAL VALUE ALERT  Critical value received:  Respiratory Panel RSV POSITIVE  Date of notification:  01/12/2017  Time of notification:  0955  Critical value read back:Yes.    Nurse who received alert:  Tacey Heap RN  MD notified (1st page):  Dr Carolin Sicks via Shea [facility]  Time of first page:  [phone]  Notified Lilia Pro RN who is taking care of this pt and Dr Carolin Sicks via Shea Evans.

## 2017-01-12 NOTE — Progress Notes (Signed)
PROGRESS NOTE    [name]  [id number] DOB: 1946/03/05 DOA: 01/11/2017 PCP: Rachel Dodson., MD   Brief Narrative: 71 y.o. female with medical history significant of anxiety, chronic hyponatremia, depression, hypertension, active smoker, possible COPD as per chart, and recurrent falls at nursing home presented with shortness of breath, wheezing and persistent cough for about a week. Patient completed a course of azithromycin on the day of admission.  Assessment & Plan:  # RSV pneumonia/ Presumed HCAP on admission: Patient completed a course of azithromycin before admission. Chest x-ray consistent with right basal pneumonia. Respiratory viral study positive for RSV which is probably the cause of patient's illness. Since RSV is positive I will discontinue vancomycin and cefepime and switched to Levaquin. P -Follow-up sputum culture -Oxygen therapy, bronchodilators, Mucinex DM  -Respiratory therapy consulted, duoneb  #Acute hypoxic respiratory failure: In the setting of pneumonia and possible COPD exacerbation. Requiring 2-3 L of oxygen. Continue treatment as discussed above. Try to wean off oxygen today.  #Possible acute COPD exacerbation in the setting of pneumonia: Received IV Solu-Medrol in the ER. Continue oral prednisone, bronchodilators, antibiotics.  #Anxiety depression: Continue home medications.  #Chronic hyponatremia: Exact etiology unknown, may be in the setting of Prozac. Follow-up urinalysis, urine osmolarity, urine sodium and potassium. Serum sodium level improved to 125 today. I will start salt tablet twice a day pending lab results. Monitor BMP.   #Hypothyroidism: Continue Synthroid.  #Hypertension: Continue losartan. Blood pressure acceptable. Continue to monitor.  #Active smoker/tobacco dependence: Education provided to the patient. Patient reported not to smoking for about a week now.   Active Problems:   HCAP (healthcare-associated pneumonia) DVT  prophylaxis: Lovenox subcutaneous Code Status: Full code Family Communication: No family present at bedside Disposition Plan: Likely discharge to nursing home in 1-2 days    Consultants:   None  Procedures: None Antimicrobials: Received vancomycin and cefepime for one day. Starting Levaquin on January 20.  Subjective: Patient was seen and examined at bedside. Reported dry cough but feeling better. Denied headache, dizziness, nausea, vomiting, chest pain.   Objective: Vitals:   01/12/17 0023 01/12/17 0600 01/12/17 0948 01/12/17 1408  BP:  (!) 143/66  131/86  Pulse:  100  97  Resp:  20  18  Temp:  98.1 F (36.7 C)  98.8 F (37.1 C)  TempSrc:  Oral  Oral  SpO2: 96% 99% 97% 95%  Weight:      Height:        Intake/Output Summary (Last 24 hours) at 01/12/17 1515 Last data filed at 01/12/17 1216  Gross per 24 hour  Intake             2145 ml  Output             1800 ml  Net              345 ml   Filed Weights   01/11/17 1000 01/11/17 1542  Weight: 54.4 kg (120 lb) 53.8 kg (118 lb 9.6 oz)    Examination:  General exam: Elderly female sitting on bed, not in distress. Respiratory system: Bibasal decreased breath sound, mild expiratory wheeze. Respiratory effort normal. Cardiovascular system: S1 & S2 heard, RRR.  No pedal edema. Gastrointestinal system: Abdomen is nondistended, soft and nontender. Normal bowel sounds heard. Central nervous system: Alert and oriented. No focal neurological deficits. Extremities: Symmetric 5 x 5 power. Skin: No rashes, lesions or ulcers Psychiatry: Judgement and insight appear normal. Mood & affect appropriate.  Data Reviewed: I have personally reviewed following labs and imaging studies  CBC:  Recent Labs Lab 01/11/17 1026 01/12/17 0559  WBC 10.2 9.3  NEUTROABS 8.6*  --   HGB 13.0 14.6  HCT 36.5 41.9  MCV 93.1 93.7  PLT 180 99991111   Basic Metabolic Panel:  Recent Labs Lab 01/11/17 1026 01/12/17 0559  NA 125* 127*  K  4.1 5.0  CL 90* 95*  CO2 28 22  GLUCOSE 160* 138*  BUN 20 19  CREATININE 1.05* 1.00  CALCIUM 8.4* 9.1   GFR: Estimated Creatinine Clearance: 42.7 mL/min (by C-G formula based on SCr of 1 mg/dL). Liver Function Tests: No results for input(s): AST, ALT, ALKPHOS, BILITOT, PROT, ALBUMIN in the last 168 hours. No results for input(s): LIPASE, AMYLASE in the last 168 hours. No results for input(s): AMMONIA in the last 168 hours. Coagulation Profile: No results for input(s): INR, PROTIME in the last 168 hours. Cardiac Enzymes: No results for input(s): CKTOTAL, CKMB, CKMBINDEX, TROPONINI in the last 168 hours. BNP (last 3 results) No results for input(s): PROBNP in the last 8760 hours. HbA1C: No results for input(s): HGBA1C in the last 72 hours. CBG: No results for input(s): GLUCAP in the last 168 hours. Lipid Profile: No results for input(s): CHOL, HDL, LDLCALC, TRIG, CHOLHDL, LDLDIRECT in the last 72 hours. Thyroid Function Tests: No results for input(s): TSH, T4TOTAL, FREET4, T3FREE, THYROIDAB in the last 72 hours. Anemia Panel: No results for input(s): VITAMINB12, FOLATE, FERRITIN, TIBC, IRON, RETICCTPCT in the last 72 hours. Sepsis Labs:  Recent Labs Lab 01/11/17 1205  LATICACIDVEN 2.1*    Recent Results (from the past 240 hour(s))  Respiratory Panel by PCR     Status: Abnormal   Collection Time: 01/11/17  1:53 PM  Result Value Ref Range Status   Adenovirus NOT DETECTED NOT DETECTED Final   Coronavirus 229E NOT DETECTED NOT DETECTED Final   Coronavirus HKU1 NOT DETECTED NOT DETECTED Final   Coronavirus NL63 NOT DETECTED NOT DETECTED Final   Coronavirus OC43 NOT DETECTED NOT DETECTED Final   Metapneumovirus NOT DETECTED NOT DETECTED Final   Rhinovirus / Enterovirus NOT DETECTED NOT DETECTED Final   Influenza A NOT DETECTED NOT DETECTED Final   Influenza B NOT DETECTED NOT DETECTED Final   Parainfluenza Virus 1 NOT DETECTED NOT DETECTED Final   Parainfluenza Virus 2 NOT  DETECTED NOT DETECTED Final   Parainfluenza Virus 3 NOT DETECTED NOT DETECTED Final   Parainfluenza Virus 4 NOT DETECTED NOT DETECTED Final   Respiratory Syncytial Virus DETECTED (A) NOT DETECTED Final    Comment: CRITICAL RESULT CALLED TO, READ BACK BY AND VERIFIED WITH: B BROWER,RN AT 0956 01/12/17 BY L BENFIELD    Bordetella pertussis NOT DETECTED NOT DETECTED Final   Chlamydophila pneumoniae NOT DETECTED NOT DETECTED Final   Mycoplasma pneumoniae NOT DETECTED NOT DETECTED Final    Comment: Performed at [facility], [place] [address], [place], [place] [address]  Culture, blood (routine x 2) Call MD if unable to obtain prior to antibiotics being given     Status: None (Preliminary result)   Collection Time: 01/11/17  2:15 PM  Result Value Ref Range Status   Specimen Description BLOOD RIGHT HAND  Final   Special Requests BOTTLES DRAWN AEROBIC AND ANAEROBIC 10CC  Final   Culture NO GROWTH < 24 HOURS  Final   Report Status PENDING  Incomplete  Culture, blood (routine x 2) Call MD if unable to obtain prior to antibiotics  being given     Status: None (Preliminary result)   Collection Time: 01/11/17  2:15 PM  Result Value Ref Range Status   Specimen Description BLOOD LEFT HAND  Final   Special Requests BOTTLES DRAWN AEROBIC AND ANAEROBIC 8CC  Final   Culture NO GROWTH < 24 HOURS  Final   Report Status PENDING  Incomplete         Radiology Studies: Dg Chest Port 1 View  Result Date: 01/11/2017 CLINICAL DATA:  Cough, chest congestion and shortness of breath over the last week. EXAM: PORTABLE CHEST 1 VIEW COMPARISON:  10/16/2016 FINDINGS: Chronic cardiomegaly. Chronic aortic atherosclerosis. Chronic large hiatal hernia responsible for the lower chest density right more than left. There may be pulmonary venous hypertension. Patchy pulmonary density bilaterally could represent mild pneumonia. No dense consolidation or lobar collapse. No effusion. Consider two-view chest radiography when  able. Previously seen pulmonary nodule on the right not discernible. Old healed right proximal humeral fracture. IMPRESSION: Chronic cardiomegaly, aortic atherosclerosis and large hiatal hernia. Worsened patchy pulmonary density bilaterally could represent mild pneumonia. Consider two-view chest radiography when able. Previously seen medial right lung nodule not visible on this image. Electronically Signed   By: Nelson Chimes M.D.   On: 01/11/2017 11:37        Scheduled Meds: . amitriptyline  100 mg Oral QHS  . dextromethorphan-guaiFENesin  1 tablet Oral BID  . enoxaparin (LOVENOX) injection  40 mg Subcutaneous Q24H  . FLUoxetine  20 mg Oral Daily  . ipratropium-albuterol  3 mL Nebulization Q6H  . levothyroxine  25 mcg Oral QAC breakfast  . losartan  50 mg Oral Daily  . mouth rinse  15 mL Mouth Rinse BID  . predniSONE  50 mg Oral Q breakfast   Continuous Infusions:   LOS: 1 day    Bethanne Mule Tanna Furry, MD [facility] Pager [phone]  If 7PM-7AM, please contact night-coverage [email or web address] Password [email or web address] 01/12/2017, 3:15 PM

## 2017-01-13 LAB — BASIC METABOLIC PANEL
Anion gap: 7 (ref 5–15)
BUN: 17 mg/dL (ref 6–20)
CHLORIDE: 98 mmol/L — AB (ref 101–111)
CO2: 27 mmol/L (ref 22–32)
Calcium: 9.1 mg/dL (ref 8.9–10.3)
Creatinine, Ser: 0.92 mg/dL (ref 0.44–1.00)
GFR calc Af Amer: >60 mL/min (ref >60)
GFR calc non Af Amer: >60 mL/min (ref >60)
Glucose, Bld: 88 mg/dL (ref 65–99)
POTASSIUM: 4.3 mmol/L (ref 3.5–5.1)
SODIUM: 132 mmol/L — AB (ref 135–145)

## 2017-01-13 LAB — STREP PNEUMONIAE URINARY ANTIGEN: Strep Pneumo Urinary Antigen: NEGATIVE

## 2017-01-13 MED ORDER — IPRATROPIUM-ALBUTEROL 0.5-2.5 (3) MG/3ML IN SOLN
3.0000 mL | Freq: Four times a day (QID) | RESPIRATORY_TRACT | Status: DC
  Administered 2017-01-13 (×2): 3 mL via RESPIRATORY_TRACT
  Filled 2017-01-13 (×2): qty 3

## 2017-01-13 MED ORDER — PREDNISONE 50 MG PO TABS: 50.0000 mg | ORAL_TABLET | Freq: Every day | ORAL | 0 refills | Status: AC

## 2017-01-13 MED ORDER — LEVOFLOXACIN 750 MG PO TABS: 750.0000 mg | ORAL_TABLET | ORAL | 0 refills | Status: AC

## 2017-01-13 MED ORDER — DM-GUAIFENESIN ER 30-600 MG PO TB12: 1.0000 | ORAL_TABLET | Freq: Two times a day (BID) | ORAL | 0 refills | Status: DC | PRN

## 2017-01-13 MED ORDER — SODIUM CHLORIDE 1 G PO TABS: 1.0000 g | ORAL_TABLET | Freq: Every day | ORAL | 0 refills | Status: DC

## 2017-01-13 NOTE — Progress Notes (Signed)
Pt was found on RA, Spo2 was 87-88%. Placed pt on 1L [place] and SPo2 rose to 94%.

## 2017-01-13 NOTE — Care Management Note (Signed)
Case Management Note  Patient Details  Name: Rachel Dodson MRN: [id number] Date of Birth: [birth year]  Subjective/Objective:                  presented with shortness of breath, wheezing and persistent cough Action/Plan: Discharge planning Expected Discharge Date:  01/13/17               Expected Discharge Plan:  Assisted Living / Rest Home  In-House Referral:  Clinical Social Work  Discharge planning Services  CM Consult  Post Acute Care Choice:  Durable Medical Equipment Choice offered to:  NA  DME Arranged:  Oxygen DME Agency:  [place]:  NA [place] Agency:  NA  Status of Service:  Completed, signed off  If discussed at [employer] of Stay Meetings, dates discussed:    Additional Comments: Cm received call from [place] for help in arranging oxygen at [place].  CM requested Lilia Pro place a pulmonary saturation note.  CM notified [facility] rep, Jermaine to please arrange and call back once O2 is at the [facility] so Marshell Levan can set up the ambulance transport back to the pt's AL.  Lilia Pro is aware Marshell Levan will set up transport back to AL once O2 is in place.  CM waiting for [facility] callback. Dellie Catholic, RN 01/13/2017, 10:53 AM

## 2017-01-13 NOTE — Progress Notes (Deleted)
Patient's oxygen saturation fell to 87% at rest on room air.

## 2017-01-13 NOTE — Clinical Social Work Note (Addendum)
CSW has confirmed with [facility] that the patient can return today once O2 has been set up at the facility by [facility]. CSW confirmed with Ray at [facility] that oxygen will be delivered by 5PM. Trudy with facility has RN's direct number so that floor can be contacted once facility is able to accept the patient. RN also has facility number for report. Multiple attempts made to reach relatives to notify of return to ALF. CSW signing off at this time.   UPDATE: CSW received another phone call regarding the patient's Mucinex dosage for today as the facility states they cannot get this medication. CSW informed Aram Beecham, for the third time that the patient would have the dose for this evening and encouraged Trudy to call RN at number provided if she had any concerns about this.   Liz Beach MSW, [place], [place], JI:[phone]

## 2017-01-13 NOTE — Progress Notes (Signed)
Patient is to be discharged home and in stable condition. Patient will be discharged back to [facility], of which she was already a resident. Patient understands changes to medication regimen and facility is aware of impending discharge. Facility advised staff that they will transport patient. Teacher, adult education, Aram Beecham, expressed that there would be no after hours pharmacy for patient to get any medication needed tonight. Explained to both the patient and facility manager that we would provide the patient with the needed medication for the nighttime dosage. Awaiting transportation at this time.  Celestia Khat, RN

## 2017-01-13 NOTE — Progress Notes (Signed)
SATURATION QUALIFICATIONS: (This note is used to comply with regulatory documentation for home oxygen)  Patient Saturations on Room Air at Rest = 95%  Patient Saturations on Room Air while Ambulating = 81%  Patient Saturations on 1 Liters of oxygen while Ambulating = 97%  Please briefly explain why patient needs home oxygen:

## 2017-01-13 NOTE — Progress Notes (Signed)
Patient's facility has called staff to inform staff once again that they have no pharmacy readily available for after hours, explained again that the needed dosage would be provided for facility use. Facility also informed staff that portable oxygen will take three hours to charge. Facility staff could not provide an estimated time that they would be able to transport patient. Patient will continue to await transportation.  Celestia Khat, RN

## 2017-01-13 NOTE — Progress Notes (Signed)
Patient has mucinex for nighttime use, RN spoke with both [facility] and pharmacy both of whom stated that it was okay to provide patient with one dose upon discharge for facility use. Facility will be notified.  Celestia Khat, RN

## 2017-01-13 NOTE — Discharge Summary (Signed)
Physician Discharge Summary  Rachel Dodson [id number] DOB: 1946/08/15 DOA: 01/11/2017  PCP: Glo Herring., MD  Admit date: 01/11/2017 Discharge date: 01/13/2017  Admitted From:SNF Disposition:SNF  Recommendations for Outpatient Follow-up:  1. Follow up with PCP in 1-2 weeks 2. Please obtain BMP/CBC in one week   Home Health:SNF Equipment/Devices:oxygen Discharge Condition:stable CODE STATUS:full Diet recommendation:regular  Brief/Interim Summary:70 y.o.femalewith medical history significant of anxiety, chronic hyponatremia, depression, hypertension, active smoker, possible COPD as per chart, and recurrent falls at nursing home presented with shortness of breath, wheezing and persistent cough for about a week. Patient completed a course of azithromycin on the day of admission.  # RSV pneumonia/ Presumed HCAP on admission (ruled out): Patient completed a course of azithromycin before admission. Chest x-ray consistent with right basal pneumonia. Respiratory viral study positive for RSV which is probably the cause of patient's illness.  -Patient clinically improved. Discharging with Mucinex, oral Levaquin to complete 5 days. Recommended outpatient follow-up.  #Acute hypoxic respiratory failure: In the setting of pneumonia and possible COPD exacerbation. Unable to wean this morning. Patient required about 1 L of oxygen to maintain oxygen saturation. Plan to discharge with 1 L of oxygen via nasal cannula with plan to wean off gradually maintaining oxygen saturation 91%. Patient clinically feels better.  #Possible acute COPD exacerbation in the setting of pneumonia: Received IV Solu-Medrol in the ER. Continue oral prednisone, bronchodilators, antibiotics. Discharge with short course of oral prednisone.  #Anxiety depression: Continue home medications.  #Chronic hyponatremia: Exact etiology unknown, may be in the setting of Prozac. Serum sodium level improved. Plan to discharge with  salt tablet once a day. Recommended to monitor labs in a week.  #Hypothyroidism: Continue Synthroid.  #Hypertension: Blood pressure acceptable. Continue to monitor.  #Active smoker/tobacco dependence: Education provided to the patient.  Discharge Diagnoses:  Active Problems:   HCAP (healthcare-associated pneumonia)   Acute bronchiolitis due to respiratory syncytial virus (RSV)    Discharge Instructions  Discharge Instructions    Call MD for:  difficulty breathing, headache or visual disturbances    Complete by:  As directed    Call MD for:  extreme fatigue    Complete by:  As directed    Call MD for:  hives    Complete by:  As directed    Call MD for:  persistant dizziness or light-headedness    Complete by:  As directed    Call MD for:  persistant nausea and vomiting    Complete by:  As directed    Call MD for:  temperature >100.4    Complete by:  As directed    Diet general    Complete by:  As directed    Discharge instructions    Complete by:  As directed    Please wean off oxygen to room air maintaining oxygen saturation more than 91%. Recheck BMP to monitor serum sodium level. Follow up with PCP.   Increase activity slowly    Complete by:  As directed      Allergies as of 01/13/2017      Reactions   Latex       Medication List    STOP taking these medications   hydrALAZINE 50 MG tablet Commonly known as:  APRESOLINE     TAKE these medications   amitriptyline 100 MG tablet Commonly known as:  ELAVIL Take 1 tablet (100 mg total) by mouth at bedtime. What changed:  when to take this   amLODipine 5 MG tablet Commonly known  as:  NORVASC Take 5 mg by mouth 2 (two) times daily.   clonazePAM 1 MG tablet Commonly known as:  KLONOPIN Take 1 mg by mouth 2 (two) times daily.   dextromethorphan-guaiFENesin 30-600 MG 12hr tablet Commonly known as:  MUCINEX DM Take 1 tablet by mouth 2 (two) times daily as needed for cough.   FLUoxetine 20 MG  capsule Commonly known as:  PROZAC Take 1 capsule (20 mg total) by mouth daily.   HYDROcodone-acetaminophen 5-325 MG tablet Commonly known as:  NORCO/VICODIN Take 1 tablet by mouth every 6 (six) hours as needed for severe pain. What changed:  reasons to take this   levofloxacin 750 MG tablet Commonly known as:  LEVAQUIN Take 1 tablet (750 mg total) by mouth every other day.   levothyroxine 25 MCG tablet Commonly known as:  SYNTHROID, LEVOTHROID Take 1 tablet (25 mcg total) by mouth daily before breakfast.   losartan 50 MG tablet Commonly known as:  COZAAR Take 1 tablet (50 mg total) by mouth daily.   pantoprazole 40 MG tablet Commonly known as:  PROTONIX Take 1 tablet (40 mg total) by mouth daily as needed (for acid reflux). What changed:  when to take this   predniSONE 50 MG tablet Commonly known as:  DELTASONE Take 1 tablet (50 mg total) by mouth daily with breakfast.   sodium chloride 1 g tablet Take 1 tablet (1 g total) by mouth daily.            Durable Medical Equipment        Start     Ordered   01/13/17 1027  DME Oxygen  Once    Comments:  Please wean off oxygen to room air maintaining of o2 sat >91 %.  Question Answer Comment  Mode or (Route) Nasal cannula   Liters per Minute 1   Frequency Continuous (stationary and portable oxygen unit needed)   Oxygen conserving device Yes   Oxygen delivery system Gas      01/13/17 1028     Follow-up Information    FUSCO,LAWRENCE J., MD. Schedule an appointment as soon as possible for a visit in 1 week(s).   Specialty:  Internal Medicine Contact information: [address] [place] [id number] [phone]          Allergies  Allergen Reactions  . Latex     Consultations: None  Procedures/Studies: None  Subjective: Patient was seen and examined at bedside. Patient reported feeling much better. Still has some dry cough. No fever, chills, headache, dizziness, shortness of breath, chest  pain. No nausea vomiting or abdominal pain.   Discharge Exam: Vitals:   01/12/17 2139 01/13/17 0655  BP: 123/69 127/76  Pulse: 100 95  Resp: 18 18  Temp: 98 F (36.7 C) 98.2 F (36.8 C)   Vitals:   01/12/17 2139 01/13/17 0655 01/13/17 0924 01/13/17 0926  BP: 123/69 127/76    Pulse: 100 95    Resp: 18 18    Temp: 98 F (36.7 C) 98.2 F (36.8 C)    TempSrc: Oral Oral    SpO2: 100% 97% (!) 88% 94%  Weight:      Height:        General: Pt is alert, awake, not in acute distress Cardiovascular: RRR, S1/S2 +, no rubs, no gallops Respiratory: CTA bilaterally, no wheezing, no rhonchi Abdominal: Soft, NT, ND, bowel sounds + Extremities: no edema, no cyanosis    The results of significant diagnostics from this hospitalization (including imaging, microbiology,  ancillary and laboratory) are listed below for reference.     Microbiology: Recent Results (from the past 240 hour(s))  Respiratory Panel by PCR     Status: Abnormal   Collection Time: 01/11/17  1:53 PM  Result Value Ref Range Status   Adenovirus NOT DETECTED NOT DETECTED Final   Coronavirus 229E NOT DETECTED NOT DETECTED Final   Coronavirus HKU1 NOT DETECTED NOT DETECTED Final   Coronavirus NL63 NOT DETECTED NOT DETECTED Final   Coronavirus OC43 NOT DETECTED NOT DETECTED Final   Metapneumovirus NOT DETECTED NOT DETECTED Final   Rhinovirus / Enterovirus NOT DETECTED NOT DETECTED Final   Influenza A NOT DETECTED NOT DETECTED Final   Influenza B NOT DETECTED NOT DETECTED Final   Parainfluenza Virus 1 NOT DETECTED NOT DETECTED Final   Parainfluenza Virus 2 NOT DETECTED NOT DETECTED Final   Parainfluenza Virus 3 NOT DETECTED NOT DETECTED Final   Parainfluenza Virus 4 NOT DETECTED NOT DETECTED Final   Respiratory Syncytial Virus DETECTED (A) NOT DETECTED Final    Comment: CRITICAL RESULT CALLED TO, READ BACK BY AND VERIFIED WITH: B BROWER,RN AT 0956 01/12/17 BY L BENFIELD    Bordetella pertussis NOT DETECTED NOT  DETECTED Final   Chlamydophila pneumoniae NOT DETECTED NOT DETECTED Final   Mycoplasma pneumoniae NOT DETECTED NOT DETECTED Final    Comment: Performed at [facility] [place] [facility], [address], [place], [place] [address]  Culture, blood (routine x 2) Call MD if unable to obtain prior to antibiotics being given     Status: None (Preliminary result)   Collection Time: 01/11/17  2:15 PM  Result Value Ref Range Status   Specimen Description BLOOD RIGHT HAND  Final   Special Requests BOTTLES DRAWN AEROBIC AND ANAEROBIC 10CC  Final   Culture NO GROWTH < 24 HOURS  Final   Report Status PENDING  Incomplete  Culture, blood (routine x 2) Call MD if unable to obtain prior to antibiotics being given     Status: None (Preliminary result)   Collection Time: 01/11/17  2:15 PM  Result Value Ref Range Status   Specimen Description BLOOD LEFT HAND  Final   Special Requests BOTTLES DRAWN AEROBIC AND ANAEROBIC 8CC  Final   Culture NO GROWTH < 24 HOURS  Final   Report Status PENDING  Incomplete     Labs: BNP (last 3 results) No results for input(s): BNP in the last 8760 hours. Basic Metabolic Panel:  Recent Labs Lab 01/11/17 1026 01/12/17 0559 01/13/17 0553  NA 125* 127* 132*  K 4.1 5.0 4.3  CL 90* 95* 98*  CO2 28 22 27   GLUCOSE 160* 138* 88  BUN 20 19 17   CREATININE 1.05* 1.00 0.92  CALCIUM 8.4* 9.1 9.1   Liver Function Tests: No results for input(s): AST, ALT, ALKPHOS, BILITOT, PROT, ALBUMIN in the last 168 hours. No results for input(s): LIPASE, AMYLASE in the last 168 hours. No results for input(s): AMMONIA in the last 168 hours. CBC:  Recent Labs Lab 01/11/17 1026 01/12/17 0559  WBC 10.2 9.3  NEUTROABS 8.6*  --   HGB 13.0 14.6  HCT 36.5 41.9  MCV 93.1 93.7  PLT 180 190   Cardiac Enzymes: No results for input(s): CKTOTAL, CKMB, CKMBINDEX, TROPONINI in the last 168 hours. BNP: Invalid input(s): POCBNP CBG: No results for input(s): GLUCAP in the last 168 hours. D-Dimer No  results for input(s): DDIMER in the last 72 hours. Hgb A1c No results for input(s): HGBA1C in the last 72  hours. Lipid Profile No results for input(s): CHOL, HDL, LDLCALC, TRIG, CHOLHDL, LDLDIRECT in the last 72 hours. Thyroid function studies No results for input(s): TSH, T4TOTAL, T3FREE, THYROIDAB in the last 72 hours.  Invalid input(s): FREET3 Anemia work up No results for input(s): VITAMINB12, FOLATE, FERRITIN, TIBC, IRON, RETICCTPCT in the last 72 hours. Urinalysis    Component Value Date/Time   COLORURINE YELLOW 01/11/2017 1541   APPEARANCEUR CLEAR 01/11/2017 1541   LABSPEC 1.006 01/11/2017 1541   PHURINE 6.0 01/11/2017 1541   GLUCOSEU NEGATIVE 01/11/2017 1541   HGBUR NEGATIVE 01/11/2017 1541   BILIRUBINUR NEGATIVE 01/11/2017 1541   KETONESUR NEGATIVE 01/11/2017 1541   PROTEINUR NEGATIVE 01/11/2017 1541   UROBILINOGEN 0.2 01/29/2014 1642   NITRITE NEGATIVE 01/11/2017 1541   LEUKOCYTESUR TRACE (A) 01/11/2017 1541   Sepsis Labs Invalid input(s): PROCALCITONIN,  WBC,  LACTICIDVEN Microbiology Recent Results (from the past 240 hour(s))  Respiratory Panel by PCR     Status: Abnormal   Collection Time: 01/11/17  1:53 PM  Result Value Ref Range Status   Adenovirus NOT DETECTED NOT DETECTED Final   Coronavirus 229E NOT DETECTED NOT DETECTED Final   Coronavirus HKU1 NOT DETECTED NOT DETECTED Final   Coronavirus NL63 NOT DETECTED NOT DETECTED Final   Coronavirus OC43 NOT DETECTED NOT DETECTED Final   Metapneumovirus NOT DETECTED NOT DETECTED Final   Rhinovirus / Enterovirus NOT DETECTED NOT DETECTED Final   Influenza A NOT DETECTED NOT DETECTED Final   Influenza B NOT DETECTED NOT DETECTED Final   Parainfluenza Virus 1 NOT DETECTED NOT DETECTED Final   Parainfluenza Virus 2 NOT DETECTED NOT DETECTED Final   Parainfluenza Virus 3 NOT DETECTED NOT DETECTED Final   Parainfluenza Virus 4 NOT DETECTED NOT DETECTED Final   Respiratory Syncytial Virus DETECTED (A) NOT DETECTED  Final    Comment: CRITICAL RESULT CALLED TO, READ BACK BY AND VERIFIED WITH: B BROWER,RN AT 0956 01/12/17 BY L BENFIELD    Bordetella pertussis NOT DETECTED NOT DETECTED Final   Chlamydophila pneumoniae NOT DETECTED NOT DETECTED Final   Mycoplasma pneumoniae NOT DETECTED NOT DETECTED Final    Comment: Performed at [facility], [place] [address] [place], [place] [address]  Culture, blood (routine x 2) Call MD if unable to obtain prior to antibiotics being given     Status: None (Preliminary result)   Collection Time: 01/11/17  2:15 PM  Result Value Ref Range Status   Specimen Description BLOOD RIGHT HAND  Final   Special Requests BOTTLES DRAWN AEROBIC AND ANAEROBIC 10CC  Final   Culture NO GROWTH < 24 HOURS  Final   Report Status PENDING  Incomplete  Culture, blood (routine x 2) Call MD if unable to obtain prior to antibiotics being given     Status: None (Preliminary result)   Collection Time: 01/11/17  2:15 PM  Result Value Ref Range Status   Specimen Description BLOOD LEFT HAND  Final   Special Requests BOTTLES DRAWN AEROBIC AND ANAEROBIC [place]  Final   Culture NO GROWTH < 24 HOURS  Final   Report Status PENDING  Incomplete     Time coordinating discharge: 28 minutes  SIGNED:   Rosita Fire, MD  [facility] 01/13/2017, 10:28 AM  If 7PM-7AM, please contact night-coverage [email or web address] Password [email or web address]

## 2017-01-13 NOTE — NC FL2 (Signed)
[place] LEVEL OF CARE SCREENING TOOL     IDENTIFICATION  Patient Name: Rachel Dodson Birthdate: Aug 19, 1946 Sex: female Admission Date (Current Location): 01/11/2017  [facility] and [place] Number:  [employer] and Address:  [place] [address], [place]      Provider Number: [id number]  Attending Physician Name and Address:  Rosita Fire, MD  Relative Name and Phone Number:       Current Level of Care: Hospital Recommended Level of Care: [place] Prior Approval Number:    Date Approved/Denied:   PASRR Number:    Discharge Plan:  (ALF)    Current Diagnoses: Patient Active Problem List   Diagnosis Date Noted  . Acute bronchiolitis due to respiratory syncytial virus (RSV)   . HCAP (healthcare-associated pneumonia) 01/11/2017  . Right humeral fracture 10/18/2016  . Laceration of fingers without complication [id number]  . Hypokalemia 01/28/2014  . ARF (acute renal failure) ([place]) 01/25/2014  . Rhabdomyolysis 01/25/2014  . Dehydration 01/25/2014  . Fall 01/25/2014  . Dementia 01/25/2014  . COPD with acute exacerbation ([place]) 01/08/2014  . Hyponatremia 11/10/2013  . Orthostatic hypotension 10/23/2013  . Leukocytosis, unspecified 10/23/2013  . Acute respiratory failure with hypoxia ([place]) 10/23/2013  . Lung nodule 10/20/2013  . UTI (urinary tract infection) 10/20/2013    Orientation RESPIRATION BLADDER Height & Weight     Self, Time, Situation  O2 (1L) Continent Weight: 53.8 kg (118 lb 9.6 oz) Height:  5\' 3"  (160 cm)  BEHAVIORAL SYMPTOMS/MOOD NEUROLOGICAL BOWEL NUTRITION STATUS   (NONE)  (NONE) Continent Diet (Heart Healthy)  AMBULATORY STATUS COMMUNICATION OF NEEDS Skin   Limited Assist Verbally Normal                       Personal Care Assistance Level of Assistance  Bathing, Feeding, Dressing Bathing Assistance: Limited assistance Feeding assistance: Independent Dressing  Assistance: Limited assistance     Functional Limitations Info  Sight, Hearing, Speech Sight Info: Adequate Hearing Info: Adequate Speech Info: Adequate    SPECIAL CARE FACTORS FREQUENCY                       Contractures Contractures Info: Not present    Additional Factors Info  Code Status, Allergies, Psychotropic, Isolation Precautions Code Status Info: Full Code Allergies Info: Latex Psychotropic Info: Prozac   Isolation Precautions Info: On droplet precautions during hospitalization      Current Medications (01/13/2017):  This is the current hospital active medication list Current Facility-Administered Medications  Medication Dose Route Frequency Provider Last Rate Last Dose  . 0.9 %  sodium chloride infusion   Intravenous Continuous Dron Tanna Furry, MD 75 mL/hr at 01/12/17 1715    . acetaminophen (TYLENOL) tablet 650 mg  650 mg Oral Q6H PRN Dron Tanna Furry, MD   650 mg at 01/13/17 0720  . ALPRAZolam Duanne Moron) tablet 1 mg  1 mg Oral BID PRN Dron Tanna Furry, MD   1 mg at 01/13/17 0720  . amitriptyline (ELAVIL) tablet 100 mg  100 mg Oral QHS Dron Tanna Furry, MD   100 mg at 01/12/17 2153  . dextromethorphan-guaiFENesin (MUCINEX DM) 30-600 MG per 12 hr tablet 1 tablet  1 tablet Oral BID Dron Tanna Furry, MD   1 tablet at 01/13/17 [phone]  . enoxaparin (LOVENOX) injection 40 mg  40 mg Subcutaneous Q24H Dron Tanna Furry, MD   40 mg at 01/12/17 1445  .  FLUoxetine (PROZAC) capsule 20 mg  20 mg Oral Daily Dron Tanna Furry, MD   20 mg at 01/13/17 [phone]  . HYDROcodone-acetaminophen (NORCO/VICODIN) 5-325 MG per tablet 1 tablet  1 tablet Oral Q6H PRN Dron Tanna Furry, MD   1 tablet at 01/13/17 [phone]  . ipratropium-albuterol (DUONEB) 0.5-2.5 (3) MG/3ML nebulizer solution 3 mL  3 mL Nebulization Q6H WA Dron Tanna Furry, MD   3 mL at 01/13/17 0923  . levofloxacin (LEVAQUIN) IVPB 750 mg  750 mg Intravenous Q48H Dron Tanna Furry, MD   750 mg at  01/12/17 1715  . levothyroxine (SYNTHROID, LEVOTHROID) tablet 25 mcg  25 mcg Oral QAC breakfast Dron Tanna Furry, MD   25 mcg at 01/13/17 [phone]  . losartan (COZAAR) tablet 50 mg  50 mg Oral Daily Dron Tanna Furry, MD   50 mg at 01/13/17 [phone]  . MEDLINE mouth rinse  15 mL Mouth Rinse BID Dron Tanna Furry, MD   15 mL at 01/13/17 1000  . ondansetron (ZOFRAN) injection 4 mg  4 mg Intravenous Q6H PRN Dron Tanna Furry, MD      . pantoprazole (PROTONIX) EC tablet 40 mg  40 mg Oral Daily PRN Dron Tanna Furry, MD      . predniSONE (DELTASONE) tablet 50 mg  50 mg Oral Q breakfast Dron Tanna Furry, MD   50 mg at 01/13/17 0721  . sodium chloride tablet 1 g  1 g Oral BID WC Dron Tanna Furry, MD   1 g at 01/13/17 1043     Discharge Medications: Please see discharge summary for a list of discharge medications.  Relevant Imaging Results:  Relevant Lab Results:   Additional Information SSN: [id number]  Rigoberto Noel, LCSW

## 2017-01-14 ENCOUNTER — Encounter: Payer: Self-pay | Admitting: Orthopedic Surgery

## 2017-01-14 ENCOUNTER — Ambulatory Visit (INDEPENDENT_AMBULATORY_CARE_PROVIDER_SITE_OTHER): Payer: Self-pay | Admitting: Orthopedic Surgery

## 2017-01-14 DIAGNOSIS — S4291XD Fracture of right shoulder girdle, part unspecified, subsequent encounter for fracture with routine healing: Secondary | ICD-10-CM

## 2017-01-14 NOTE — Progress Notes (Signed)
Chief Complaint  Patient presents with  . Follow-up    RIGHT PROXIMAL HUMERUS FRACTURE, DOI 11/05/17    Status post right proximal humerus fracture. She had some range of motion deficits which have improved  She has abduction 90 external rotation 45 and passive flexion 110  She continues to arm is much as she wants and she will see [place] on an as-needed basis

## 2017-01-16 LAB — CULTURE, BLOOD (ROUTINE X 2)
CULTURE: NO GROWTH
Culture: NO GROWTH

## 2017-01-22 ENCOUNTER — Encounter: Payer: Self-pay | Admitting: Internal Medicine

## 2017-01-27 DIAGNOSIS — R600 Localized edema: Secondary | ICD-10-CM | POA: Diagnosis not present

## 2017-01-27 DIAGNOSIS — J449 Chronic obstructive pulmonary disease, unspecified: Secondary | ICD-10-CM | POA: Diagnosis not present

## 2017-01-27 DIAGNOSIS — J189 Pneumonia, unspecified organism: Secondary | ICD-10-CM | POA: Diagnosis not present

## 2017-02-04 DIAGNOSIS — M79676 Pain in unspecified toe(s): Secondary | ICD-10-CM | POA: Diagnosis not present

## 2017-02-04 DIAGNOSIS — B351 Tinea unguium: Secondary | ICD-10-CM | POA: Diagnosis not present

## 2017-02-04 DIAGNOSIS — L84 Corns and callosities: Secondary | ICD-10-CM | POA: Diagnosis not present

## 2017-02-04 DIAGNOSIS — I7389 Other specified peripheral vascular diseases: Secondary | ICD-10-CM | POA: Diagnosis not present

## 2017-02-11 DIAGNOSIS — R600 Localized edema: Secondary | ICD-10-CM | POA: Diagnosis not present

## 2017-02-11 DIAGNOSIS — J449 Chronic obstructive pulmonary disease, unspecified: Secondary | ICD-10-CM | POA: Diagnosis not present

## 2017-03-23 DIAGNOSIS — I10 Essential (primary) hypertension: Secondary | ICD-10-CM | POA: Diagnosis not present

## 2017-03-23 DIAGNOSIS — E039 Hypothyroidism, unspecified: Secondary | ICD-10-CM | POA: Diagnosis not present

## 2017-04-08 DIAGNOSIS — M2010 Hallux valgus (acquired), unspecified foot: Secondary | ICD-10-CM | POA: Diagnosis not present

## 2017-04-08 DIAGNOSIS — I7389 Other specified peripheral vascular diseases: Secondary | ICD-10-CM | POA: Diagnosis not present

## 2017-04-08 DIAGNOSIS — B351 Tinea unguium: Secondary | ICD-10-CM | POA: Diagnosis not present

## 2017-04-08 DIAGNOSIS — L84 Corns and callosities: Secondary | ICD-10-CM | POA: Diagnosis not present

## 2017-04-14 DIAGNOSIS — I10 Essential (primary) hypertension: Secondary | ICD-10-CM | POA: Diagnosis not present

## 2017-04-14 DIAGNOSIS — E039 Hypothyroidism, unspecified: Secondary | ICD-10-CM | POA: Diagnosis not present

## 2017-04-14 DIAGNOSIS — J0100 Acute maxillary sinusitis, unspecified: Secondary | ICD-10-CM | POA: Diagnosis not present

## 2017-04-24 DIAGNOSIS — K5900 Constipation, unspecified: Secondary | ICD-10-CM | POA: Diagnosis not present

## 2017-04-24 DIAGNOSIS — I10 Essential (primary) hypertension: Secondary | ICD-10-CM | POA: Diagnosis not present

## 2017-05-28 DIAGNOSIS — H269 Unspecified cataract: Secondary | ICD-10-CM | POA: Diagnosis not present

## 2017-05-30 ENCOUNTER — Emergency Department (HOSPITAL_COMMUNITY): Payer: Medicare Other

## 2017-05-30 ENCOUNTER — Emergency Department (HOSPITAL_COMMUNITY)
Admission: EM | Admit: 2017-05-30 | Discharge: 2017-05-30 | Disposition: A | Discharge status: Home / Self Care | Payer: Medicare Other | Attending: Emergency Medicine | Admitting: Emergency Medicine

## 2017-05-30 ENCOUNTER — Encounter (HOSPITAL_COMMUNITY): Payer: Self-pay | Admitting: Emergency Medicine

## 2017-05-30 DIAGNOSIS — I10 Essential (primary) hypertension: Secondary | ICD-10-CM | POA: Diagnosis not present

## 2017-05-30 DIAGNOSIS — Z79899 Other long term (current) drug therapy: Secondary | ICD-10-CM | POA: Diagnosis not present

## 2017-05-30 DIAGNOSIS — J441 Chronic obstructive pulmonary disease with (acute) exacerbation: Secondary | ICD-10-CM | POA: Insufficient documentation

## 2017-05-30 DIAGNOSIS — R05 Cough: Secondary | ICD-10-CM | POA: Diagnosis not present

## 2017-05-30 DIAGNOSIS — J069 Acute upper respiratory infection, unspecified: Secondary | ICD-10-CM | POA: Diagnosis not present

## 2017-05-30 DIAGNOSIS — F17210 Nicotine dependence, cigarettes, uncomplicated: Secondary | ICD-10-CM | POA: Diagnosis not present

## 2017-05-30 DIAGNOSIS — N179 Acute kidney failure, unspecified: Secondary | ICD-10-CM | POA: Diagnosis not present

## 2017-05-30 MED ORDER — ALBUTEROL SULFATE (2.5 MG/3ML) 0.083% IN NEBU
5.0000 mg | INHALATION_SOLUTION | Freq: Once | RESPIRATORY_TRACT | Status: AC
  Administered 2017-05-30: 5 mg via RESPIRATORY_TRACT
  Filled 2017-05-30: qty 6

## 2017-05-30 MED ORDER — ALBUTEROL SULFATE HFA 108 (90 BASE) MCG/ACT IN AERS: 2.0000 | INHALATION_SPRAY | RESPIRATORY_TRACT | 3 refills | Status: AC | PRN

## 2017-05-30 NOTE — ED Triage Notes (Addendum)
Patient from [facility] with complaint of cough and wheezing x 3 days. NAD noted at triage.

## 2017-05-30 NOTE — ED Notes (Signed)
Pt ambulatory to bathroom  Without difficulty.  No distress.  Room air sats 88-90% after back to room.

## 2017-05-30 NOTE — ED Provider Notes (Signed)
[place] DEPT Provider Note   CSN: [id number] Arrival date & time: 05/30/17  4098     History   Chief Complaint Chief Complaint  Patient presents with  . Cough    HPI Rachel Dodson is a 71 y.o. female.  HPI  The patient is a 70 year old female, she currently has been complaining of runny nose, mild sore throat, ongoing coughing which has been present for the last several days. There is a report of some wheezing as well. She states that she has not had any breathing treatments prior to arrival, she denies fevers, denies chest pain or back pain or swelling of the legs. Nothing seems to make this better or worse, she was sent by the facility where she lives for further evaluation. The patient currently lives at [facility] assisted living.  Past Medical History:  Diagnosis Date  . Anxiety   . Chronic hyponatremia   . Depression   . Hypertension   . Recurrent falls     Patient Active Problem List   Diagnosis Date Noted  . Acute bronchiolitis due to respiratory syncytial virus (RSV)   . HCAP (healthcare-associated pneumonia) 01/11/2017  . Right humeral fracture 10/18/2016  . Laceration of fingers without complication 11/91/4782  . Hypokalemia 01/28/2014  . ARF (acute renal failure) ([place]) 01/25/2014  . Rhabdomyolysis 01/25/2014  . Dehydration 01/25/2014  . Fall 01/25/2014  . Dementia 01/25/2014  . COPD with acute exacerbation ([place]) 01/08/2014  . Hyponatremia 11/10/2013  . Orthostatic hypotension 10/23/2013  . Leukocytosis, unspecified 10/23/2013  . Acute respiratory failure with hypoxia ([place]) 10/23/2013  . Lung nodule 10/20/2013  . UTI (urinary tract infection) 10/20/2013    Past Surgical History:  Procedure Laterality Date  . ABDOMINAL HYSTERECTOMY    . VIDEO ASSISTED THORACOSCOPY Left 10/23/2013   Procedure: VIDEO ASSISTED THORACOSCOPY;  Surgeon: Grace Isaac, MD;  Location: [facility];  Service: Thoracic;  Laterality: Left;  Marland Kitchen VIDEO BRONCHOSCOPY N/A  10/23/2013   Procedure: VIDEO BRONCHOSCOPY;  Surgeon: Grace Isaac, MD;  Location: [facility] OR;  Service: Thoracic;  Laterality: N/A;    OB History    No data available       Home Medications    Prior to Admission medications   Medication Sig Start Date End Date Taking? Authorizing Provider  albuterol (PROVENTIL HFA;VENTOLIN HFA) 108 (90 Base) MCG/ACT inhaler Inhale 2 puffs into the lungs every 4 (four) hours as needed for wheezing or shortness of breath. 05/30/17   Noemi Chapel, MD  amitriptyline (ELAVIL) 100 MG tablet Take 1 tablet (100 mg total) by mouth at bedtime. Patient taking differently: Take 100 mg by mouth daily.  10/19/16   Verlee Monte, MD  amLODipine (NORVASC) 5 MG tablet Take 5 mg by mouth 2 (two) times daily.    [provider]  clonazePAM (KLONOPIN) 1 MG tablet Take 1 mg by mouth 2 (two) times daily.    [provider]  dextromethorphan-guaiFENesin (MUCINEX DM) 30-600 MG 12hr tablet Take 1 tablet by mouth 2 (two) times daily as needed for cough. 01/13/17   Rosita Fire, MD  FLUoxetine (PROZAC) 20 MG capsule Take 1 capsule (20 mg total) by mouth daily. 10/19/16   Verlee Monte, MD  HYDROcodone-acetaminophen (NORCO/VICODIN) 5-325 MG tablet Take 1 tablet by mouth every 6 (six) hours as needed for severe pain. Patient taking differently: Take 1 tablet by mouth every 6 (six) hours as needed for moderate pain.  10/19/16   Verlee Monte, MD  levothyroxine (SYNTHROID, LEVOTHROID) 25  MCG tablet Take 1 tablet (25 mcg total) by mouth daily before breakfast. 10/19/16   Verlee Monte, MD  losartan (COZAAR) 50 MG tablet Take 1 tablet (50 mg total) by mouth daily. 10/19/16   Verlee Monte, MD  pantoprazole (PROTONIX) 40 MG tablet Take 1 tablet (40 mg total) by mouth daily as needed (for acid reflux). Patient taking differently: Take 40 mg by mouth daily.  10/19/16   Verlee Monte, MD  sodium chloride 1 g tablet Take 1 tablet (1 g total) by mouth daily. 01/13/17    Rosita Fire, MD    Family History History reviewed. No pertinent family history.  Social History Social History  Substance Use Topics  . Smoking status: Current Every Day Smoker    Packs/day: 0.25    Years: 15.00    Types: Cigarettes  . Smokeless tobacco: Never Used  . Alcohol use No     Allergies   Latex   Review of Systems Review of Systems  All other systems reviewed and are negative.    Physical Exam Updated Vital Signs BP 123/61 (BP Location: Right Arm)   Pulse (!) 104   Temp 98.4 F (36.9 C) (Oral)   Resp 20   Ht 5\' 3"  (1.6 m)   Wt 54.4 kg (120 lb)   SpO2 (!) 88% Comment: patient was sleeping  BMI 21.26 kg/m   Physical Exam  Constitutional: She appears well-developed and well-nourished. No distress.  HENT:  Head: Normocephalic and atraumatic.  Mouth/Throat: Oropharynx is clear and moist. No oropharyngeal exudate.  TM's clear bilaterally OP clear without masses, exudates or asymtery  Eyes: Conjunctivae and EOM are normal. Pupils are equal, round, and reactive to light. Right eye exhibits no discharge. Left eye exhibits no discharge. No scleral icterus.  Neck: Normal range of motion. Neck supple. No JVD present. No thyromegaly present.  Supple, no trismus or torticollis  Cardiovascular: Normal rate, regular rhythm, normal heart sounds and intact distal pulses.  Exam reveals no gallop and no friction rub.   No murmur heard. Pulmonary/Chest: Effort normal. No respiratory distress. She has wheezes ( mild end expiratory wheezing). She has no rales.  Abdominal: Soft. Bowel sounds are normal. She exhibits no distension and no mass. There is no tenderness.  Musculoskeletal: Normal range of motion. She exhibits no edema or tenderness.  Lymphadenopathy:    She has no cervical adenopathy.  Neurological: She is alert. Coordination normal.  Skin: Skin is warm and dry. No rash noted. No erythema.  Psychiatric: She has a normal mood and affect. Her behavior  is normal.  Nursing note and vitals reviewed.    ED Treatments / Results  Labs (all labs ordered are listed, but only abnormal results are displayed) Labs Reviewed - No data to display   Radiology Dg Chest 2 View  Result Date: 05/30/2017 CLINICAL DATA:  Wheezing and cough for 3 days EXAM: CHEST  2 VIEW COMPARISON:  Chest radiograph January 12, 2017; chest CT November 24, 2015 FINDINGS: There is no edema or consolidation. Heart is slightly enlarged with pulmonary vascularity within normal limits. No adenopathy. There is aortic atherosclerosis. There it is a large hiatal type hernia. There is degenerative change in the thoracic spine. There is evidence of an old healed fracture of the proximal right humerus with remodeling. IMPRESSION: Large hiatal type hernia. Mild cardiac enlargement with aortic atherosclerosis. No edema consolidation. Evidence of old trauma with remodeling proximal right humerus. Electronically Signed   By: Lowella Grip III M.D.  On: 05/30/2017 10:32    Procedures Procedures (including critical care time)  Medications Ordered in ED Medications  albuterol (PROVENTIL) (2.5 MG/3ML) 0.083% nebulizer solution 5 mg (5 mg Nebulization Given 05/30/17 1256)     Initial Impression / Assessment and Plan / ED Course  I have reviewed the triage vital signs and the nursing notes.  Pertinent labs & imaging results that were available during my care of the patient were reviewed by me and considered in my medical decision making (see chart for details).     The pt has no pna / ptx or other abnormality on xray Albuterol here and for home Sat's and VS without acute concern  Albuterol given Pt well appearing Stable for d/c back to assisted living MDI Rx given  Vitals:   05/30/17 1009 05/30/17 1010 05/30/17 1259  BP: 123/61    Pulse: (!) 104    Resp: 20    Temp: 98.4 F (36.9 C)    TempSrc: Oral    SpO2: 92%  (!) 88%  Weight:  54.4 kg (120 lb)   Height:  5\' 3"  (1.6  m)     She is no longer tachycardic, sat's are 96% on RA on my exam Well appearing for d/c with MDI  Final Clinical Impressions(s) / ED Diagnoses   Final diagnoses:  Acute upper respiratory infection    New Prescriptions New Prescriptions   ALBUTEROL (PROVENTIL HFA;VENTOLIN HFA) 108 (90 BASE) MCG/ACT INHALER    Inhale 2 puffs into the lungs every 4 (four) hours as needed for wheezing or shortness of breath.     Noemi Chapel, MD 05/30/17 1328

## 2017-05-30 NOTE — ED Notes (Signed)
Updated patient on timeframe of wait. Patient denies any complaints at this time. States "I'm just sleepy."

## 2017-05-30 NOTE — ED Notes (Addendum)
[facility] contacted and advised of pt dc. thtey stated will be here in 15 min. edp states pt is ok to dc

## 2017-05-30 NOTE — Discharge Instructions (Signed)
You likely have an upper respiratory infection Your testing shows that you do NOT have a pneumonia  You should take the albuterol inhaler - 2 puffs every 4 hours as needed for wheezing / coughing.  Return to the ER immediately for severe or worsening coughing, fevers or difficulty breathing  Your doctor should recheck you in the next week.

## 2017-08-03 DIAGNOSIS — R911 Solitary pulmonary nodule: Secondary | ICD-10-CM | POA: Diagnosis not present

## 2017-08-03 DIAGNOSIS — K219 Gastro-esophageal reflux disease without esophagitis: Secondary | ICD-10-CM | POA: Diagnosis not present

## 2017-08-28 ENCOUNTER — Inpatient Hospital Stay (HOSPITAL_COMMUNITY)
Admission: EM | Admit: 2017-08-28 | Discharge: 2017-08-30 | DRG: 690 | Disposition: A | Discharge status: Home / Self Care | Payer: Medicare Other | Attending: Internal Medicine | Admitting: Internal Medicine

## 2017-08-28 ENCOUNTER — Emergency Department (HOSPITAL_COMMUNITY): Payer: Medicare Other

## 2017-08-28 ENCOUNTER — Encounter (HOSPITAL_COMMUNITY): Payer: Self-pay

## 2017-08-28 DIAGNOSIS — L659 Nonscarring hair loss, unspecified: Secondary | ICD-10-CM | POA: Diagnosis not present

## 2017-08-28 DIAGNOSIS — Z79891 Long term (current) use of opiate analgesic: Secondary | ICD-10-CM

## 2017-08-28 DIAGNOSIS — K449 Diaphragmatic hernia without obstruction or gangrene: Secondary | ICD-10-CM | POA: Diagnosis present

## 2017-08-28 DIAGNOSIS — N390 Urinary tract infection, site not specified: Principal | ICD-10-CM | POA: Diagnosis present

## 2017-08-28 DIAGNOSIS — F0390 Unspecified dementia without behavioral disturbance: Secondary | ICD-10-CM | POA: Diagnosis present

## 2017-08-28 DIAGNOSIS — R0602 Shortness of breath: Secondary | ICD-10-CM | POA: Diagnosis not present

## 2017-08-28 DIAGNOSIS — J441 Chronic obstructive pulmonary disease with (acute) exacerbation: Secondary | ICD-10-CM | POA: Diagnosis present

## 2017-08-28 DIAGNOSIS — I959 Hypotension, unspecified: Secondary | ICD-10-CM | POA: Diagnosis present

## 2017-08-28 DIAGNOSIS — J449 Chronic obstructive pulmonary disease, unspecified: Secondary | ICD-10-CM | POA: Diagnosis present

## 2017-08-28 DIAGNOSIS — I10 Essential (primary) hypertension: Secondary | ICD-10-CM | POA: Diagnosis present

## 2017-08-28 DIAGNOSIS — N3000 Acute cystitis without hematuria: Secondary | ICD-10-CM

## 2017-08-28 DIAGNOSIS — A419 Sepsis, unspecified organism: Secondary | ICD-10-CM

## 2017-08-28 DIAGNOSIS — E8770 Fluid overload, unspecified: Secondary | ICD-10-CM | POA: Diagnosis not present

## 2017-08-28 DIAGNOSIS — J9601 Acute respiratory failure with hypoxia: Secondary | ICD-10-CM | POA: Diagnosis present

## 2017-08-28 DIAGNOSIS — J811 Chronic pulmonary edema: Secondary | ICD-10-CM | POA: Diagnosis present

## 2017-08-28 DIAGNOSIS — Z91040 Latex allergy status: Secondary | ICD-10-CM

## 2017-08-28 DIAGNOSIS — E871 Hypo-osmolality and hyponatremia: Secondary | ICD-10-CM | POA: Diagnosis not present

## 2017-08-28 DIAGNOSIS — R112 Nausea with vomiting, unspecified: Secondary | ICD-10-CM | POA: Diagnosis not present

## 2017-08-28 DIAGNOSIS — Z79899 Other long term (current) drug therapy: Secondary | ICD-10-CM

## 2017-08-28 DIAGNOSIS — I272 Pulmonary hypertension, unspecified: Secondary | ICD-10-CM | POA: Diagnosis present

## 2017-08-28 DIAGNOSIS — F419 Anxiety disorder, unspecified: Secondary | ICD-10-CM | POA: Diagnosis present

## 2017-08-28 DIAGNOSIS — R0902 Hypoxemia: Secondary | ICD-10-CM | POA: Diagnosis not present

## 2017-08-28 DIAGNOSIS — I517 Cardiomegaly: Secondary | ICD-10-CM | POA: Diagnosis not present

## 2017-08-28 DIAGNOSIS — F329 Major depressive disorder, single episode, unspecified: Secondary | ICD-10-CM | POA: Diagnosis present

## 2017-08-28 DIAGNOSIS — F17210 Nicotine dependence, cigarettes, uncomplicated: Secondary | ICD-10-CM | POA: Diagnosis present

## 2017-08-28 DIAGNOSIS — M6281 Muscle weakness (generalized): Secondary | ICD-10-CM | POA: Diagnosis not present

## 2017-08-28 DIAGNOSIS — R0682 Tachypnea, not elsewhere classified: Secondary | ICD-10-CM | POA: Diagnosis not present

## 2017-08-28 DIAGNOSIS — I482 Chronic atrial fibrillation: Secondary | ICD-10-CM | POA: Diagnosis not present

## 2017-08-28 LAB — URINALYSIS, ROUTINE W REFLEX MICROSCOPIC
Bilirubin Urine: NEGATIVE
Glucose, UA: NEGATIVE mg/dL
Hgb urine dipstick: NEGATIVE
Ketones, ur: NEGATIVE mg/dL
Nitrite: NEGATIVE
PH: 5 (ref 5.0–8.0)
Protein, ur: NEGATIVE mg/dL
Specific Gravity, Urine: 1.014 (ref 1.005–1.030)

## 2017-08-28 LAB — CBC WITH DIFFERENTIAL/PLATELET
BASOS ABS: 0 10*3/uL (ref 0.0–0.1)
Basophils Relative: 0 %
Eosinophils Absolute: 0 10*3/uL (ref 0.0–0.7)
Eosinophils Relative: 0 %
HEMATOCRIT: 33.1 % — AB (ref 36.0–46.0)
HEMOGLOBIN: 11.4 g/dL — AB (ref 12.0–15.0)
LYMPHS PCT: 5 %
Lymphs Abs: 0.7 10*3/uL (ref 0.7–4.0)
MCH: 31.9 pg (ref 26.0–34.0)
MCHC: 34.4 g/dL (ref 30.0–36.0)
MCV: 92.7 fL (ref 78.0–100.0)
Monocytes Absolute: 0.8 10*3/uL (ref 0.1–1.0)
Monocytes Relative: 6 %
NEUTROS ABS: 12.5 10*3/uL — AB (ref 1.7–7.7)
Neutrophils Relative %: 89 %
Platelets: 295 10*3/uL (ref 150–400)
RBC: 3.57 MIL/uL — AB (ref 3.87–5.11)
RDW: 13 % (ref 11.5–15.5)
WBC: 13.9 10*3/uL — AB (ref 4.0–10.5)

## 2017-08-28 LAB — COMPREHENSIVE METABOLIC PANEL
ALBUMIN: 3.9 g/dL (ref 3.5–5.0)
ALT: 13 U/L — ABNORMAL LOW (ref 14–54)
ANION GAP: 10 (ref 5–15)
AST: 16 U/L (ref 15–41)
Alkaline Phosphatase: 68 U/L (ref 38–126)
BILIRUBIN TOTAL: 0.5 mg/dL (ref 0.3–1.2)
BUN: 36 mg/dL — ABNORMAL HIGH (ref 6–20)
CO2: 28 mmol/L (ref 22–32)
Calcium: 9.7 mg/dL (ref 8.9–10.3)
Chloride: 85 mmol/L — ABNORMAL LOW (ref 101–111)
Creatinine, Ser: 1.03 mg/dL — ABNORMAL HIGH (ref 0.44–1.00)
GFR calc Af Amer: >60 mL/min (ref >60)
GFR, EST NON AFRICAN AMERICAN: 53 mL/min — AB (ref >60)
GLUCOSE: 161 mg/dL — AB (ref 65–99)
POTASSIUM: 3.8 mmol/L (ref 3.5–5.1)
Sodium: 123 mmol/L — ABNORMAL LOW (ref 135–145)
TOTAL PROTEIN: 6.6 g/dL (ref 6.5–8.1)

## 2017-08-28 LAB — LACTIC ACID, PLASMA: Lactic Acid, Venous: 1.1 mmol/L (ref 0.5–1.9)

## 2017-08-28 MED ORDER — SODIUM CHLORIDE 0.9 % IV BOLUS (SEPSIS)
500.0000 mL | Freq: Once | INTRAVENOUS | Status: AC
  Administered 2017-08-28: 500 mL via INTRAVENOUS

## 2017-08-28 MED ORDER — DEXTROSE 5 % IV SOLN
500.0000 mg | Freq: Once | INTRAVENOUS | Status: AC
  Administered 2017-08-28: 500 mg via INTRAVENOUS
  Filled 2017-08-28: qty 500

## 2017-08-28 MED ORDER — DEXTROSE 5 % IV SOLN
1.0000 g | Freq: Once | INTRAVENOUS | Status: AC
  Administered 2017-08-28: 1 g via INTRAVENOUS
  Filled 2017-08-28: qty 10

## 2017-08-28 MED ORDER — HYDROCODONE-ACETAMINOPHEN 5-325 MG PO TABS
1.0000 | ORAL_TABLET | Freq: Once | ORAL | Status: AC
  Administered 2017-08-28: 1 via ORAL
  Filled 2017-08-28: qty 1

## 2017-08-28 MED ORDER — IOPAMIDOL (ISOVUE-370) INJECTION 76%
100.0000 mL | Freq: Once | INTRAVENOUS | Status: AC | PRN
  Administered 2017-08-28: 100 mL via INTRAVENOUS

## 2017-08-28 MED ORDER — ACETAMINOPHEN 500 MG PO TABS
1000.0000 mg | ORAL_TABLET | Freq: Once | ORAL | Status: AC
  Administered 2017-08-28: 1000 mg via ORAL
  Filled 2017-08-28: qty 2

## 2017-08-28 MED ORDER — SODIUM CHLORIDE 0.9 % IV BOLUS (SEPSIS)
1000.0000 mL | Freq: Once | INTRAVENOUS | Status: AC
  Administered 2017-08-28: 1000 mL via INTRAVENOUS

## 2017-08-28 MED ORDER — SODIUM CHLORIDE 0.9 % IV BOLUS (SEPSIS)
250.0000 mL | Freq: Once | INTRAVENOUS | Status: AC
  Administered 2017-08-28: 250 mL via INTRAVENOUS

## 2017-08-28 NOTE — ED Triage Notes (Signed)
Pt arrives via [facility] from [facility] with c/o headache, abd pain, vomiting, constipation

## 2017-08-28 NOTE — ED Notes (Signed)
ED Provider at bedside. 

## 2017-08-28 NOTE — H&P (Signed)
History and Physical    Rachel Dodson ZOX:[id number] DOB: 01-May-1946 DOA: 08/28/2017  PCP: Redmond School, MD  Patient coming from: SNF.    Chief Complaint:   Weakness and SOB.  HPI: Rachel Dodson is an 71 y.o. female with hx of dementia, chronic hyponatremia, HTN, anxiety, depression, presents to the ER not feeling well, having weakness and some SOB.  She denied CP, fever, chills, nausea vomiting or abdominal pain.  Evaluation in the ER included a CXR showed stable cardiomegaly, a UA showed TNTC WBCs, a WBC of 14K, and Cr of 1.0.  Her chest CT showed no PE, but there is a large hiatal hernia, with her stomach in the chest cavity, and evidence of pulmonary HTN.   She was given IVF, about 1.5 L in the ER.  And she was given oxygen supplement, as her Sat was low at 86%.  She was given IV Rocephin, and Hositalist was asked to admit her for hypoxia, UTI, and possible pulmonary edema.   ED Course:  See above.  Rewiew of Systems:  Constitutional: Negative for malaise, fever and chills. No significant weight loss or weight gain Eyes: Negative for eye pain, redness and discharge, diplopia, visual changes, or flashes of light. ENMT: Negative for ear pain, hoarseness, nasal congestion, sinus pressure and sore throat. No headaches; tinnitus, drooling, or problem swallowing. Cardiovascular: Negative for chest pain, palpitations, diaphoresis, and peripheral edema. ; No orthopnea, PND Respiratory: Negative for cough, hemoptysis, wheezing and stridor. No pleuritic chestpain. Gastrointestinal: Negative for diarrhea, constipation,  melena, blood in stool, hematemesis, jaundice and rectal bleeding.    Genitourinary: Negative for frequency, dysuria, incontinence,flank pain and hematuria; Musculoskeletal: Negative for back pain and neck pain. Negative for swelling and trauma.;  Skin: . Negative for pruritus, rash, abrasions, bruising and skin lesion.; ulcerations Neuro: Negative for headache, lightheadedness  and neck stiffness. Negative for weakness, altered level of consciousness , altered mental status, extremity weakness, burning feet, involuntary movement, seizure and syncope.  Psych: negative for anxiety, depression, insomnia, tearfulness, panic attacks, hallucinations, paranoia, suicidal or homicidal ideation    Past Medical History:  Diagnosis Date  . Anxiety   . Chronic hyponatremia   . Depression   . Hypertension   . Recurrent falls     Past Surgical History:  Procedure Laterality Date  . ABDOMINAL HYSTERECTOMY    . VIDEO ASSISTED THORACOSCOPY Left 10/23/2013   Procedure: VIDEO ASSISTED THORACOSCOPY;  Surgeon: Grace Isaac, MD;  Location: [facility];  Service: Thoracic;  Laterality: Left;  Marland Kitchen VIDEO BRONCHOSCOPY N/A 10/23/2013   Procedure: VIDEO BRONCHOSCOPY;  Surgeon: Grace Isaac, MD;  Location: [facility];  Service: Thoracic;  Laterality: N/A;     reports that she has been smoking Cigarettes.  She has a 3.75 pack-year smoking history. She has never used smokeless tobacco. She reports that she does not drink alcohol or use drugs.  Allergies  Allergen Reactions  . Latex     No family history on file.   Prior to Admission medications   Medication Sig Start Date End Date Taking? Authorizing Provider  albuterol (PROVENTIL HFA;VENTOLIN HFA) 108 (90 Base) MCG/ACT inhaler Inhale 2 puffs into the lungs every 4 (four) hours as needed for wheezing or shortness of breath. 05/30/17  Yes Noemi Chapel, MD  amitriptyline (ELAVIL) 100 MG tablet Take 1 tablet (100 mg total) by mouth at bedtime. Patient taking differently: Take 100 mg by mouth daily.  10/19/16  Yes Verlee Monte, MD  amLODipine (NORVASC) 5 MG  tablet Take 5 mg by mouth 2 (two) times daily.   Yes [provider]  clonazePAM (KLONOPIN) 1 MG tablet Take 1 mg by mouth 2 (two) times daily.   Yes [provider]  docusate sodium (COLACE) 100 MG capsule Take 100 mg by mouth 2 (two) times daily.   Yes [provider]  FLUoxetine (PROZAC) 40 MG capsule Take 40 mg by mouth daily.  08/05/17  Yes [provider]  hydrALAZINE (APRESOLINE) 25 MG tablet Take 25 mg by mouth 3 (three) times daily.   Yes [provider]  HYDROcodone-acetaminophen (NORCO/VICODIN) 5-325 MG tablet Take 1 tablet by mouth every 6 (six) hours as needed for severe pain. Patient taking differently: Take 1 tablet by mouth every 6 (six) hours as needed for moderate pain.  10/19/16  Yes Verlee Monte, MD  levothyroxine (SYNTHROID, LEVOTHROID) 25 MCG tablet Take 1 tablet (25 mcg total) by mouth daily before breakfast. 10/19/16  Yes Elmahi, Rae Lips, MD  losartan (COZAAR) 50 MG tablet Take 1 tablet (50 mg total) by mouth daily. 10/19/16  Yes Verlee Monte, MD  pantoprazole (PROTONIX) 40 MG tablet Take 1 tablet (40 mg total) by mouth daily as needed (for acid reflux). Patient taking differently: Take 40 mg by mouth daily.  10/19/16  Yes Verlee Monte, MD  polyethylene glycol powder (GLYCOLAX/MIRALAX) powder Take 17 g by mouth daily as needed for mild constipation.   Yes [provider]    Physical Exam: Vitals:   08/28/17 1957 08/28/17 2000 08/28/17 2230  BP:  107/81 94/60  Pulse:  (!) 112 99  Resp:  (!) 31 18  SpO2:  95% 96%  Weight: 54.4 kg (120 lb)    Height: 5\' 3"  (1.6 m)        Constitutional: NAD, calm, comfortable Vitals:   08/28/17 1957 08/28/17 2000 08/28/17 2230  BP:  107/81 94/60  Pulse:  (!) 112 99  Resp:  (!) 31 18  SpO2:  95% 96%  Weight: 54.4 kg (120 lb)    Height: 5\' 3"  (1.6 m)     Eyes: PERRL, lids and conjunctivae normal ENMT: Mucous membranes are moist. Posterior pharynx clear of any exudate or lesions.Normal dentition.  Neck: normal, supple, no masses, no thyromegaly Respiratory: clear to auscultation bilaterally, no wheezing, no crackles. Normal respiratory effort. No accessory muscle use.  Cardiovascular: Regular rate and rhythm, no murmurs / rubs / gallops. No extremity  edema. 2+ pedal pulses. No carotid bruits.  Abdomen: no tenderness, no masses palpated. No hepatosplenomegaly. Bowel sounds positive.  Musculoskeletal: no clubbing / cyanosis. No joint deformity upper and lower extremities. Good ROM, no contractures. Normal muscle tone.  Skin: no rashes, lesions, ulcers. No induration Neurologic: CN 2-12 grossly intact. Sensation intact, DTR normal. Strength 5/5 in all 4.  Psychiatric: Normal judgment and insight. Alert and oriented x 3. Normal mood.   Labs on Admission: I have personally reviewed following labs and imaging studies  CBC:  Recent Labs Lab 08/28/17 2015  WBC 13.9*  NEUTROABS 12.5*  HGB 11.4*  HCT 33.1*  MCV 92.7  PLT 379   Basic Metabolic Panel:  Recent Labs Lab 08/28/17 2015  NA 123*  K 3.8  CL 85*  CO2 28  GLUCOSE 161*  BUN 36*  CREATININE 1.03*  CALCIUM 9.7   GFR: Estimated Creatinine Clearance: 40.8 mL/min (A) (by C-G formula based on SCr of 1.03 mg/dL (H)). Liver Function Tests:  Recent Labs Lab 08/28/17 2015  AST 16  ALT 13*  ALKPHOS 68  BILITOT 0.5  PROT 6.6  ALBUMIN 3.9   Urine analysis:    Component Value Date/Time   COLORURINE YELLOW 08/28/2017 2145   APPEARANCEUR CLOUDY (A) 08/28/2017 2145   LABSPEC 1.014 08/28/2017 2145   PHURINE 5.0 08/28/2017 2145   GLUCOSEU NEGATIVE 08/28/2017 2145   HGBUR NEGATIVE 08/28/2017 2145   BILIRUBINUR NEGATIVE 08/28/2017 2145   KETONESUR NEGATIVE 08/28/2017 2145   PROTEINUR NEGATIVE 08/28/2017 2145   UROBILINOGEN 0.2 01/29/2014 1642   NITRITE NEGATIVE 08/28/2017 2145   LEUKOCYTESUR LARGE (A) 08/28/2017 2145    Recent Results (from the past 240 hour(s))  Blood Culture (routine x 2)     Status: None (Preliminary result)   Collection Time: 08/28/17  8:49 PM  Result Value Ref Range Status   Specimen Description BLOOD LEFT ARM  Final   Special Requests   Final    BOTTLES DRAWN AEROBIC AND ANAEROBIC Blood Culture adequate volume   Culture PENDING  Incomplete    Report Status PENDING  Incomplete  Blood Culture (routine x 2)     Status: None (Preliminary result)   Collection Time: 08/28/17  8:54 PM  Result Value Ref Range Status   Specimen Description BLOOD LEFT HAND  Final   Special Requests   Final    BOTTLES DRAWN AEROBIC AND ANAEROBIC Blood Culture adequate volume   Culture PENDING  Incomplete   Report Status PENDING  Incomplete     Radiological Exams on Admission: Dg Chest 2 View  Result Date: 08/28/2017 CLINICAL DATA:  Possible pneumonia. EXAM: CHEST  2 VIEW COMPARISON:  Radiographs of May 30, 2017. FINDINGS: Stable cardiomegaly. Atherosclerosis of thoracic aorta is noted. No pneumothorax is noted. Large hiatal hernia is noted which is significantly increased in size compared to prior exam. No pneumothorax or pleural effusion is noted. Bony thorax is unremarkable. IMPRESSION: Aortic atherosclerosis. Stable cardiomegaly. Very large hiatal hernia is noted which is significantly increased in size compared to prior exam. Electronically Signed   By: Marijo Conception, M.D.   On: 08/28/2017 21:22   Ct Angio Chest Pe W And/or Wo Contrast  Result Date: 08/28/2017 CLINICAL DATA:  PE suspected, high pretest prob. Shortness of breath, abdominal pain and vomiting. EXAM: CT ANGIOGRAPHY CHEST WITH CONTRAST TECHNIQUE: Multidetector CT imaging of the chest was performed using the standard protocol during bolus administration of intravenous contrast. Multiplanar CT image reconstructions and MIPs were obtained to evaluate the vascular anatomy. CONTRAST:  100 cc Isovue 370 IV COMPARISON:  Chest radiograph earlier this day. Chest CT 11/24/2015, additional priors FINDINGS: Cardiovascular: There are no filling defects within the pulmonary arteries to suggest pulmonary embolus. Dilatation of main pulmonary artery measuring 3.8 cm suggesting pulmonary arterial hypertension. Heart is displaced anteriorly by a large hiatal hernia. Tortuous atherosclerotic thoracic aorta without  frank aneurysm. There are coronary artery calcifications. Small pericardial effusion measures up to 13 mm in depth adjacent to the left heart border. Mediastinum/Nodes: Large hiatal hernia containing ingested material with air-fluid level, the stomach is near entirely intrathoracic. This causes displacement of the heart anteriorly. The upper esophagus is patulous. There is no mediastinal or hilar adenopathy. Trachea and mainstem bronchi are patent. Lungs/Pleura: Mild smooth septal thickening at the lung apices. Right upper lobe patchy ground-glass opacity likely pulmonary edema. Superior segment right lower lobe pulmonary nodule measures 14 x 13 mm, unchanged. No new pulmonary nodule. Compressive atelectasis adjacent to large hiatal hernia. Small right and trace left pleural effusion. Incidental bilateral breast implants. Upper Abdomen: The  entire stomach is intrathoracic with large hiatal hernia. No acute upper abdominal abnormality. Musculoskeletal: Exaggerated thoracic kyphosis. Multilevel degenerative change in the thoracic spine. No compression fracture. Remote fracture of the sternum and right proximal humerus. No acute or suspicious osseous abnormality. Review of the MIP images confirms the above findings. IMPRESSION: 1. No pulmonary embolus. Dilated main pulmonary artery consistent with pulmonary arterial hypertension. 2. Mild pulmonary edema. Small right and trace left pleural effusion. 3. Large hiatal hernia, near entire stomach is intrathoracic. Stomach is distended with ingested contents with air-fluid levels. 4. Superior segment right lower lobe pulmonary nodule measures 1.4 x 1.3 cm, unchanged from chest CT dating back to 2015 consistent with benign nodule. 5. Aortic atherosclerosis.  Coronary artery calcifications. Aortic Atherosclerosis (ICD10-I70.0). Electronically Signed   By: Jeb Levering M.D.   On: 08/28/2017 22:17    EKG: Independently reviewed.   Assessment/Plan Principal Problem:    UTI (urinary tract infection) Active Problems:   Acute respiratory failure with hypoxia (HCC)   COPD with acute exacerbation (HCC)   Dementia   PLAN:   Hypoxic respiraratory failure:  She has not been on oxygen previously, and I suspect she has COPD exacerbation.  Will continue with her nebs.    UTI:  Will continue with her IV Rocephin.     Vascular congestion:  Her BP is now 120.  Will stop all her IVF.  Give IV Lasix if required, but I suspect she will be OK without lasix.  Would hold all her anti hypertensive meds for now.  Hypotension:  Her original BP was 98 systolic, in the setting of volume overload.  Will hold her BP meds for now.   COPD  Stable. Will continue with inhaler as required.   Dementia:  Noted.  Mild.    DVT prophylaxis: subQ heparin.  Code Status: FULL CODE.  Family Communication: None at bedside.  Disposition Plan: SNF.  Consults called: None.  Admission status:  Inpatient.    Lakedra Washington MD FACP. [facility]  If 7PM-7AM, please contact night-coverage [email or web address] Password [facility]  08/28/2017, 11:37 PM

## 2017-08-29 ENCOUNTER — Encounter (HOSPITAL_COMMUNITY): Payer: Self-pay | Admitting: *Deleted

## 2017-08-29 DIAGNOSIS — N3000 Acute cystitis without hematuria: Secondary | ICD-10-CM

## 2017-08-29 DIAGNOSIS — J9601 Acute respiratory failure with hypoxia: Secondary | ICD-10-CM

## 2017-08-29 LAB — CBC
HCT: 31.3 % — ABNORMAL LOW (ref 36.0–46.0)
Hemoglobin: 10.6 g/dL — ABNORMAL LOW (ref 12.0–15.0)
MCH: 31.7 pg (ref 26.0–34.0)
MCHC: 33.9 g/dL (ref 30.0–36.0)
MCV: 93.7 fL (ref 78.0–100.0)
Platelets: 350 10*3/uL (ref 150–400)
RBC: 3.34 MIL/uL — ABNORMAL LOW (ref 3.87–5.11)
RDW: 13.3 % (ref 11.5–15.5)
WBC: 15.3 10*3/uL — ABNORMAL HIGH (ref 4.0–10.5)

## 2017-08-29 LAB — BASIC METABOLIC PANEL
Anion gap: 10 (ref 5–15)
BUN: 37 mg/dL — AB (ref 6–20)
CALCIUM: 9.5 mg/dL (ref 8.9–10.3)
CO2: 28 mmol/L (ref 22–32)
CREATININE: 0.98 mg/dL (ref 0.44–1.00)
Chloride: 89 mmol/L — ABNORMAL LOW (ref 101–111)
GFR calc Af Amer: >60 mL/min (ref >60)
GFR, EST NON AFRICAN AMERICAN: 56 mL/min — AB (ref >60)
GLUCOSE: 135 mg/dL — AB (ref 65–99)
Potassium: 3.9 mmol/L (ref 3.5–5.1)
Sodium: 127 mmol/L — ABNORMAL LOW (ref 135–145)

## 2017-08-29 LAB — MRSA PCR SCREENING: MRSA by PCR: NEGATIVE

## 2017-08-29 MED ORDER — DEXTROSE 5 % IV SOLN
1.0000 g | INTRAVENOUS | Status: DC
  Administered 2017-08-29: 1 g via INTRAVENOUS
  Filled 2017-08-29 (×4): qty 10

## 2017-08-29 MED ORDER — SODIUM CHLORIDE 0.9% FLUSH
3.0000 mL | Freq: Two times a day (BID) | INTRAVENOUS | Status: DC
  Administered 2017-08-29 – 2017-08-30 (×4): 3 mL via INTRAVENOUS

## 2017-08-29 MED ORDER — DOCUSATE SODIUM 100 MG PO CAPS
100.0000 mg | ORAL_CAPSULE | Freq: Two times a day (BID) | ORAL | Status: DC
  Administered 2017-08-29 – 2017-08-30 (×4): 100 mg via ORAL
  Filled 2017-08-29 (×4): qty 1

## 2017-08-29 MED ORDER — POLYETHYLENE GLYCOL 3350 17 G PO PACK: 17.0000 g | PACK | Freq: Every day | ORAL | Status: DC

## 2017-08-29 MED ORDER — ACETAMINOPHEN 325 MG PO TABS
650.0000 mg | ORAL_TABLET | ORAL | Status: DC | PRN
  Administered 2017-08-29 – 2017-08-30 (×3): 650 mg via ORAL
  Filled 2017-08-29 (×3): qty 2

## 2017-08-29 MED ORDER — CLONAZEPAM 0.5 MG PO TABS
1.0000 mg | ORAL_TABLET | Freq: Two times a day (BID) | ORAL | Status: DC
  Administered 2017-08-29 – 2017-08-30 (×4): 1 mg via ORAL
  Filled 2017-08-29 (×4): qty 2

## 2017-08-29 MED ORDER — ONDANSETRON HCL 4 MG/2ML IJ SOLN
4.0000 mg | Freq: Four times a day (QID) | INTRAMUSCULAR | Status: DC | PRN
  Administered 2017-08-29 – 2017-08-30 (×2): 4 mg via INTRAVENOUS
  Filled 2017-08-29 (×2): qty 2

## 2017-08-29 MED ORDER — ORAL CARE MOUTH RINSE
15.0000 mL | Freq: Two times a day (BID) | OROMUCOSAL | Status: DC
  Administered 2017-08-29 – 2017-08-30 (×2): 15 mL via OROMUCOSAL

## 2017-08-29 MED ORDER — HYDROCODONE-ACETAMINOPHEN 5-325 MG PO TABS
1.0000 | ORAL_TABLET | Freq: Four times a day (QID) | ORAL | Status: DC | PRN
  Administered 2017-08-29 – 2017-08-30 (×2): 1 via ORAL
  Filled 2017-08-29 (×2): qty 1

## 2017-08-29 MED ORDER — LEVOTHYROXINE SODIUM 50 MCG PO TABS
25.0000 ug | ORAL_TABLET | Freq: Every day | ORAL | Status: DC
  Administered 2017-08-29 – 2017-08-30 (×2): 25 ug via ORAL
  Filled 2017-08-29 (×2): qty 1

## 2017-08-29 MED ORDER — POLYETHYLENE GLYCOL 3350 17 GM/SCOOP PO POWD
17.0000 g | Freq: Every day | ORAL | Status: DC | PRN
  Filled 2017-08-29: qty 255

## 2017-08-29 MED ORDER — ENOXAPARIN SODIUM 40 MG/0.4ML ~~LOC~~ SOLN
40.0000 mg | SUBCUTANEOUS | Status: DC
  Administered 2017-08-29 – 2017-08-30 (×2): 40 mg via SUBCUTANEOUS
  Filled 2017-08-29 (×2): qty 0.4

## 2017-08-29 MED ORDER — PANTOPRAZOLE SODIUM 40 MG PO TBEC
40.0000 mg | DELAYED_RELEASE_TABLET | Freq: Every day | ORAL | Status: DC
  Administered 2017-08-29 – 2017-08-30 (×2): 40 mg via ORAL
  Filled 2017-08-29 (×2): qty 1

## 2017-08-29 MED ORDER — FLUOXETINE HCL 20 MG PO CAPS
40.0000 mg | ORAL_CAPSULE | Freq: Every day | ORAL | Status: DC
  Administered 2017-08-29 – 2017-08-30 (×2): 40 mg via ORAL
  Filled 2017-08-29 (×2): qty 2

## 2017-08-29 MED ORDER — AMITRIPTYLINE HCL 25 MG PO TABS
100.0000 mg | ORAL_TABLET | Freq: Every day | ORAL | Status: DC
  Administered 2017-08-29 – 2017-08-30 (×2): 100 mg via ORAL
  Filled 2017-08-29: qty 4
  Filled 2017-08-29 (×2): qty 1
  Filled 2017-08-29: qty 4

## 2017-08-29 NOTE — Care Management Note (Signed)
Case Management Note  Patient Details  Name: Rachel Dodson MRN: [id number] Date of Birth: 1946-05-16  Subjective/Objective:   Adm with UTI, hypoxia. From [facility] ALF.  ? COPD exacerbation, no oxygen at ALF prior to arrival.           Action/Plan: CSW aware of admission and will make arrangements for return. CM following for possible oxygen needs.    Expected Discharge Date:    08/30/2017              Expected Discharge Plan:  Assisted Living / Rest Home  In-House Referral:  Clinical Social Work  Discharge planning Services  CM Consult  Post Acute Care Choice:    Choice offered to:     DME Arranged:    DME Agency:     HH Arranged:    HH Agency:     Status of Service:  In process, will continue to follow  If discussed at Long Length of Stay Meetings, dates discussed:    Additional Comments:  Rubbie Goostree, Chauncey Reading, RN 08/29/2017, 12:20 PM

## 2017-08-29 NOTE — ED Notes (Signed)
[facility] called spoke w/ Aram Beecham and advised pt was being admitted.

## 2017-08-29 NOTE — Progress Notes (Signed)
PROGRESS NOTE    Rachel Dodson  CVE:[id number] DOB: [birth year] DOA: 08/28/2017 PCP: Redmond School, MD     Brief Narrative:  71 year old woman admitted to the hospital from home on 9/5 due to weakness and shortness of breath. Found to have a UTI on arrival.   Assessment & Plan:   Principal Problem:   UTI (urinary tract infection) Active Problems:   Acute respiratory failure with hypoxia (HCC)   COPD with acute exacerbation (HCC)   Dementia   UTI -Continue Rocephin pending culture data. -Unfortunately culture were not ordered on admission, will request one now;  if negative would still complete course of treatment.  Acute hypoxemic respiratory failure -Lung auscultation is clear, try to wean oxygen as tolerated. -Do not believe she has acute COPD exacerbation given her clear lung exam today and lack of medications given for presumed COPD exacerbation.   DVT prophylaxis: lovenox Code Status: full code Family Communication: patient only Disposition Plan: Hope for DC home in 24 hours  Consultants:   None  Procedures:   None  Antimicrobials:  Anti-infectives    Start     Dose/Rate Route Frequency Ordered Stop   08/29/17 2000  cefTRIAXone (ROCEPHIN) 1 g in dextrose 5 % 50 mL IVPB     1 g 100 mL/hr over 30 Minutes Intravenous Every 24 hours 08/29/17 0019     08/28/17 2030  cefTRIAXone (ROCEPHIN) 1 g in dextrose 5 % 50 mL IVPB     1 g 100 mL/hr over 30 Minutes Intravenous  Once 08/28/17 2020 08/28/17 2136   08/28/17 2030  azithromycin (ZITHROMAX) 500 mg in dextrose 5 % 250 mL IVPB     500 mg 250 mL/hr over 60 Minutes Intravenous  Once 08/28/17 2020 08/28/17 2221       Subjective: Feels nauseous, no emesis, otherwise feels ok.  Objective: Vitals:   08/29/17 0041 08/29/17 0431 08/29/17 1106 08/29/17 1300  BP: 96/71 137/68  114/63  Pulse: (!) 116 (!) 111  (!) 117  Resp: 20 18  19   Temp: 98.1 F (36.7 C) 98.6 F (37 C)  99.3 F (37.4 C)  TempSrc: Oral  Oral  Oral  SpO2: 93% 99% 93% 98%  Weight: 59.7 kg (131 lb 9.6 oz)     Height:        Intake/Output Summary (Last 24 hours) at 08/29/17 1703 Last data filed at 08/29/17 1349  Gross per 24 hour  Intake             3216 ml  Output             1800 ml  Net             1416 ml   Filed Weights   08/28/17 1957 08/29/17 0041  Weight: 54.4 kg (120 lb) 59.7 kg (131 lb 9.6 oz)    Examination:  General exam: Alert, awake, oriented x 3 Respiratory system: Clear to auscultation. Respiratory effort normal. Cardiovascular system:RRR. No murmurs, rubs, gallops. Gastrointestinal system: Abdomen is nondistended, soft and nontender. No organomegaly or masses felt. Normal bowel sounds heard. Central nervous system: Alert and oriented. No focal neurological deficits. Extremities: No C/C/E, +pedal pulses Skin: No rashes, lesions or ulcers Psychiatry: Judgement and insight appear normal. Mood & affect appropriate.     Data Reviewed: I have personally reviewed following labs and imaging studies  CBC:  Recent Labs Lab 08/28/17 2015 08/29/17 0608  WBC 13.9* 15.3*  NEUTROABS 12.5*  --   HGB 11.4* 10.6*  HCT 33.1* 31.3*  MCV 92.7 93.7  PLT 295 974   Basic Metabolic Panel:  Recent Labs Lab 08/28/17 2015 08/29/17 0608  NA 123* 127*  K 3.8 3.9  CL 85* 89*  CO2 28 28  GLUCOSE 161* 135*  BUN 36* 37*  CREATININE 1.03* 0.98  CALCIUM 9.7 9.5   GFR: Estimated Creatinine Clearance: 42.9 mL/min (by C-G formula based on SCr of 0.98 mg/dL). Liver Function Tests:  Recent Labs Lab 08/28/17 2015  AST 16  ALT 13*  ALKPHOS 68  BILITOT 0.5  PROT 6.6  ALBUMIN 3.9   No results for input(s): LIPASE, AMYLASE in the last 168 hours. No results for input(s): AMMONIA in the last 168 hours. Coagulation Profile: No results for input(s): INR, PROTIME in the last 168 hours. Cardiac Enzymes: No results for input(s): CKTOTAL, CKMB, CKMBINDEX, TROPONINI in the last 168 hours. BNP (last 3  results) No results for input(s): PROBNP in the last 8760 hours. HbA1C: No results for input(s): HGBA1C in the last 72 hours. CBG: No results for input(s): GLUCAP in the last 168 hours. Lipid Profile: No results for input(s): CHOL, HDL, LDLCALC, TRIG, CHOLHDL, LDLDIRECT in the last 72 hours. Thyroid Function Tests: No results for input(s): TSH, T4TOTAL, FREET4, T3FREE, THYROIDAB in the last 72 hours. Anemia Panel: No results for input(s): VITAMINB12, FOLATE, FERRITIN, TIBC, IRON, RETICCTPCT in the last 72 hours. Urine analysis:    Component Value Date/Time   COLORURINE YELLOW 08/28/2017 2145   APPEARANCEUR CLOUDY (A) 08/28/2017 2145   LABSPEC 1.014 08/28/2017 2145   PHURINE 5.0 08/28/2017 2145   GLUCOSEU NEGATIVE 08/28/2017 2145   HGBUR NEGATIVE 08/28/2017 2145   BILIRUBINUR NEGATIVE 08/28/2017 2145   KETONESUR NEGATIVE 08/28/2017 2145   PROTEINUR NEGATIVE 08/28/2017 2145   UROBILINOGEN 0.2 01/29/2014 1642   NITRITE NEGATIVE 08/28/2017 2145   LEUKOCYTESUR LARGE (A) 08/28/2017 2145   Sepsis Labs: @LABRCNTIP (procalcitonin:4,lacticidven:4)  ) Recent Results (from the past 240 hour(s))  Blood Culture (routine x 2)     Status: None (Preliminary result)   Collection Time: 08/28/17  8:49 PM  Result Value Ref Range Status   Specimen Description BLOOD LEFT ARM  Final   Special Requests   Final    BOTTLES DRAWN AEROBIC AND ANAEROBIC Blood Culture adequate volume   Culture NO GROWTH < 12 HOURS  Final   Report Status PENDING  Incomplete  Blood Culture (routine x 2)     Status: None (Preliminary result)   Collection Time: 08/28/17  8:54 PM  Result Value Ref Range Status   Specimen Description BLOOD LEFT HAND  Final   Special Requests   Final    BOTTLES DRAWN AEROBIC AND ANAEROBIC Blood Culture adequate volume   Culture NO GROWTH < 12 HOURS  Final   Report Status PENDING  Incomplete  MRSA PCR Screening     Status: None   Collection Time: 08/29/17 12:59 AM  Result Value Ref Range  Status   MRSA by PCR NEGATIVE NEGATIVE Final    Comment:        The GeneXpert MRSA Assay (FDA approved for NASAL specimens only), is one component of a comprehensive MRSA colonization surveillance program. It is not intended to diagnose MRSA infection nor to guide or monitor treatment for MRSA infections.          Radiology Studies: Dg Chest 2 View  Result Date: 08/28/2017 CLINICAL DATA:  Possible pneumonia. EXAM: CHEST  2 VIEW COMPARISON:  Radiographs of May 30, 2017. FINDINGS: Stable cardiomegaly.  Atherosclerosis of thoracic aorta is noted. No pneumothorax is noted. Large hiatal hernia is noted which is significantly increased in size compared to prior exam. No pneumothorax or pleural effusion is noted. Bony thorax is unremarkable. IMPRESSION: Aortic atherosclerosis. Stable cardiomegaly. Very large hiatal hernia is noted which is significantly increased in size compared to prior exam. Electronically Signed   By: Marijo Conception, M.D.   On: 08/28/2017 21:22   Ct Angio Chest Pe W And/or Wo Contrast  Result Date: 08/28/2017 CLINICAL DATA:  PE suspected, high pretest prob. Shortness of breath, abdominal pain and vomiting. EXAM: CT ANGIOGRAPHY CHEST WITH CONTRAST TECHNIQUE: Multidetector CT imaging of the chest was performed using the standard protocol during bolus administration of intravenous contrast. Multiplanar CT image reconstructions and MIPs were obtained to evaluate the vascular anatomy. CONTRAST:  100 cc Isovue 370 IV COMPARISON:  Chest radiograph earlier this day. Chest CT 11/24/2015, additional priors FINDINGS: Cardiovascular: There are no filling defects within the pulmonary arteries to suggest pulmonary embolus. Dilatation of main pulmonary artery measuring 3.8 cm suggesting pulmonary arterial hypertension. Heart is displaced anteriorly by a large hiatal hernia. Tortuous atherosclerotic thoracic aorta without frank aneurysm. There are coronary artery calcifications. Small  pericardial effusion measures up to 13 mm in depth adjacent to the left heart border. Mediastinum/Nodes: Large hiatal hernia containing ingested material with air-fluid level, the stomach is near entirely intrathoracic. This causes displacement of the heart anteriorly. The upper esophagus is patulous. There is no mediastinal or hilar adenopathy. Trachea and mainstem bronchi are patent. Lungs/Pleura: Mild smooth septal thickening at the lung apices. Right upper lobe patchy ground-glass opacity likely pulmonary edema. Superior segment right lower lobe pulmonary nodule measures 14 x 13 mm, unchanged. No new pulmonary nodule. Compressive atelectasis adjacent to large hiatal hernia. Small right and trace left pleural effusion. Incidental bilateral breast implants. Upper Abdomen: The entire stomach is intrathoracic with large hiatal hernia. No acute upper abdominal abnormality. Musculoskeletal: Exaggerated thoracic kyphosis. Multilevel degenerative change in the thoracic spine. No compression fracture. Remote fracture of the sternum and right proximal humerus. No acute or suspicious osseous abnormality. Review of the MIP images confirms the above findings. IMPRESSION: 1. No pulmonary embolus. Dilated main pulmonary artery consistent with pulmonary arterial hypertension. 2. Mild pulmonary edema. Small right and trace left pleural effusion. 3. Large hiatal hernia, near entire stomach is intrathoracic. Stomach is distended with ingested contents with air-fluid levels. 4. Superior segment right lower lobe pulmonary nodule measures 1.4 x 1.3 cm, unchanged from chest CT dating back to 2015 consistent with benign nodule. 5. Aortic atherosclerosis.  Coronary artery calcifications. Aortic Atherosclerosis (ICD10-I70.0). Electronically Signed   By: Jeb Levering M.D.   On: 08/28/2017 22:17        Scheduled Meds: . amitriptyline  100 mg Oral Daily  . clonazePAM  1 mg Oral BID  . docusate sodium  100 mg Oral BID  .  enoxaparin (LOVENOX) injection  40 mg Subcutaneous Q24H  . FLUoxetine  40 mg Oral Daily  . levothyroxine  25 mcg Oral QAC breakfast  . mouth rinse  15 mL Mouth Rinse BID  . pantoprazole  40 mg Oral Daily  . sodium chloride flush  3 mL Intravenous Q12H   Continuous Infusions: . cefTRIAXone (ROCEPHIN)  IV       LOS: 1 day    Time spent: 25 minutes. Greater than 50% of this time was spent in direct contact with the patient coordinating care.     Lelon Frohlich, MD  [facility] Pager [phone]  If 7PM-7AM, please contact night-coverage [email or web address] Password [email or web address] 08/29/2017, 5:03 PM

## 2017-08-29 NOTE — ED Provider Notes (Signed)
Emergency Department Provider Note   I have reviewed the triage vital signs and the nursing notes.   HISTORY  Chief Complaint Headache   HPI Rachel Dodson is a 71 y.o. female who is here with a few days of symptoms to include weakness, shortness of breath, chest pain, headache and polyuria. Patient states the only sent them seemed to start her on the same time. Has not had a cough. No fever. No other associated or modifying symptoms. Does have a little bit of back pain.   Past Medical History:  Diagnosis Date  . Anxiety   . Chronic hyponatremia   . Depression   . Hypertension   . Recurrent falls     Patient Active Problem List   Diagnosis Date Noted  . Acute bronchiolitis due to respiratory syncytial virus (RSV)   . HCAP (healthcare-associated pneumonia) 01/11/2017  . Right humeral fracture 10/18/2016  . Laceration of fingers without complication 23/53/6144  . Hypokalemia 01/28/2014  . ARF (acute renal failure) ([place]) 01/25/2014  . Rhabdomyolysis 01/25/2014  . Dehydration 01/25/2014  . Fall 01/25/2014  . Dementia 01/25/2014  . COPD with acute exacerbation ([place]) 01/08/2014  . Hyponatremia 11/10/2013  . Orthostatic hypotension 10/23/2013  . Leukocytosis, unspecified 10/23/2013  . Acute respiratory failure with hypoxia ([place]) 10/23/2013  . Lung nodule 10/20/2013  . UTI (urinary tract infection) 10/20/2013    Past Surgical History:  Procedure Laterality Date  . ABDOMINAL HYSTERECTOMY    . VIDEO ASSISTED THORACOSCOPY Left 10/23/2013   Procedure: VIDEO ASSISTED THORACOSCOPY;  Surgeon: Grace Isaac, MD;  Location: [place];  Service: Thoracic;  Laterality: Left;  Marland Kitchen VIDEO BRONCHOSCOPY N/A 10/23/2013   Procedure: VIDEO BRONCHOSCOPY;  Surgeon: Grace Isaac, MD;  Location: [place];  Service: Thoracic;  Laterality: N/A;    Current Outpatient Rx  . Order #: [id number] Class: Print  . Order #: [id number] Class: Print  . Order #: [id number] Class: Historical Med  .  Order #: [id number] Class: Historical Med  . Order #: [id number] Class: Historical Med  . Order #: [id number] Class: Historical Med  . Order #: [id number] Class: Historical Med  . Order #: [id number] Class: Print  . Order #: [id number] Class: Normal  . Order #: [id number] Class: Normal  . Order #: [id number] Class: Print  . Order #: [id number] Class: Historical Med    Allergies Latex  No family history on file.  Social History Social History  Substance Use Topics  . Smoking status: Current Every Day Smoker    Packs/day: 0.25    Years: 15.00    Types: Cigarettes  . Smokeless tobacco: Never Used  . Alcohol use No    Review of Systems  All other systems negative except as documented in the HPI. All pertinent positives and negatives as reviewed in the HPI. ____________________________________________  PHYSICAL EXAM:  VITAL SIGNS: ED Triage Vitals  Enc Vitals Group     BP 08/28/17 2000 107/81     Pulse Rate 08/28/17 2000 (!) 112     Resp 08/28/17 2000 (!) 31     Temp --      Temp src --      SpO2 08/28/17 2000 95 %     Weight 08/28/17 1957 120 lb (54.4 kg)     Height 08/28/17 1957 5\' 3"  (1.6 m)     Head Circumference --      Peak Flow --      Pain Score 08/28/17 1956 8     Pain Loc --      Pain Edu? --  Excl. in [place]? --     Constitutional: Alert and oriented. Well appearing and in no acute distress. Eyes: Conjunctivae are normal. PERRL. EOMI. Head: Atraumatic. Nose: No congestion/rhinnorhea. Mouth/Throat: Mucous membranes are moist.  Oropharynx non-erythematous. Neck: No stridor.  No meningeal signs.   Cardiovascular: tachycardic rate, regular rhythm. Good peripheral circulation. Grossly normal heart sounds.   Respiratory: tachypneic.  No retractions. Lungs CTAB. Gastrointestinal: Soft and nontender. No distention.  Musculoskeletal: No lower extremity tenderness nor edema. No gross deformities of extremities. Neurologic:  Normal speech and language. No gross focal  neurologic deficits are appreciated.  Skin:  Skin is warm, dry and intact. No rash noted.  ____________________________________________   LABS (all labs ordered are listed, but only abnormal results are displayed)  Labs Reviewed  COMPREHENSIVE METABOLIC PANEL - Abnormal; Notable for the following:       Result Value   Sodium 123 (*)    Chloride 85 (*)    Glucose, Bld 161 (*)    BUN 36 (*)    Creatinine, Ser 1.03 (*)    ALT 13 (*)    GFR calc non Af Amer 53 (*)    All other components within normal limits  CBC WITH DIFFERENTIAL/PLATELET - Abnormal; Notable for the following:    WBC 13.9 (*)    RBC 3.57 (*)    Hemoglobin 11.4 (*)    HCT 33.1 (*)    Neutro Abs 12.5 (*)    All other components within normal limits  URINALYSIS, ROUTINE W REFLEX MICROSCOPIC - Abnormal; Notable for the following:    APPearance CLOUDY (*)    Leukocytes, UA LARGE (*)    Bacteria, UA MANY (*)    Squamous Epithelial / LPF 0-5 (*)    All other components within normal limits  CULTURE, BLOOD (ROUTINE X 2)  CULTURE, BLOOD (ROUTINE X 2)  LACTIC ACID, PLASMA   ____________________________________________  EKG   EKG Interpretation  Date/Time:  Wednesday August 28 2017 19:59:20 EDT Ventricular Rate:  113 PR Interval:    QRS Duration: 140 QT Interval:  358 QTC Calculation: 491 R Axis:   130 Text Interpretation:  Sinus tachycardia Atrial premature complex LAE, consider biatrial enlargement RBBB and LPFB Confirmed by Merrily Pew [phone]) on 08/28/2017 9:30:49 PM       ____________________________________________  RADIOLOGY  Dg Chest 2 View  Result Date: 08/28/2017 CLINICAL DATA:  Possible pneumonia. EXAM: CHEST  2 VIEW COMPARISON:  Radiographs of May 30, 2017. FINDINGS: Stable cardiomegaly. Atherosclerosis of thoracic aorta is noted. No pneumothorax is noted. Large hiatal hernia is noted which is significantly increased in size compared to prior exam. No pneumothorax or pleural effusion is  noted. Bony thorax is unremarkable. IMPRESSION: Aortic atherosclerosis. Stable cardiomegaly. Very large hiatal hernia is noted which is significantly increased in size compared to prior exam. Electronically Signed   By: Marijo Conception, M.D.   On: 08/28/2017 21:22   Ct Angio Chest Pe W And/or Wo Contrast  Result Date: 08/28/2017 CLINICAL DATA:  PE suspected, high pretest prob. Shortness of breath, abdominal pain and vomiting. EXAM: CT ANGIOGRAPHY CHEST WITH CONTRAST TECHNIQUE: Multidetector CT imaging of the chest was performed using the standard protocol during bolus administration of intravenous contrast. Multiplanar CT image reconstructions and MIPs were obtained to evaluate the vascular anatomy. CONTRAST:  100 cc Isovue 370 IV COMPARISON:  Chest radiograph earlier this day. Chest CT 11/24/2015, additional priors FINDINGS: Cardiovascular: There are no filling defects within the pulmonary arteries to suggest pulmonary embolus.  Dilatation of main pulmonary artery measuring 3.8 cm suggesting pulmonary arterial hypertension. Heart is displaced anteriorly by a large hiatal hernia. Tortuous atherosclerotic thoracic aorta without frank aneurysm. There are coronary artery calcifications. Small pericardial effusion measures up to 13 mm in depth adjacent to the left heart border. Mediastinum/Nodes: Large hiatal hernia containing ingested material with air-fluid level, the stomach is near entirely intrathoracic. This causes displacement of the heart anteriorly. The upper esophagus is patulous. There is no mediastinal or hilar adenopathy. Trachea and mainstem bronchi are patent. Lungs/Pleura: Mild smooth septal thickening at the lung apices. Right upper lobe patchy ground-glass opacity likely pulmonary edema. Superior segment right lower lobe pulmonary nodule measures 14 x 13 mm, unchanged. No new pulmonary nodule. Compressive atelectasis adjacent to large hiatal hernia. Small right and trace left pleural effusion.  Incidental bilateral breast implants. Upper Abdomen: The entire stomach is intrathoracic with large hiatal hernia. No acute upper abdominal abnormality. Musculoskeletal: Exaggerated thoracic kyphosis. Multilevel degenerative change in the thoracic spine. No compression fracture. Remote fracture of the sternum and right proximal humerus. No acute or suspicious osseous abnormality. Review of the MIP images confirms the above findings. IMPRESSION: 1. No pulmonary embolus. Dilated main pulmonary artery consistent with pulmonary arterial hypertension. 2. Mild pulmonary edema. Small right and trace left pleural effusion. 3. Large hiatal hernia, near entire stomach is intrathoracic. Stomach is distended with ingested contents with air-fluid levels. 4. Superior segment right lower lobe pulmonary nodule measures 1.4 x 1.3 cm, unchanged from chest CT dating back to 2015 consistent with benign nodule. 5. Aortic atherosclerosis.  Coronary artery calcifications. Aortic Atherosclerosis (ICD10-I70.0). Electronically Signed   By: Jeb Levering M.D.   On: 08/28/2017 22:17   ____________________________________________   PROCEDURES  Procedure(s) performed:   Procedures ____________________________________________   INITIAL IMPRESSION / ASSESSMENT AND PLAN / ED COURSE  Pertinent labs & imaging results that were available during my care of the patient were reviewed by me and considered in my medical decision making (see chart for details).  Initially suspected pneumonia as a cause of her symptoms however she turned out to have UTI. This did not explain her tachycardia, tachypnea and hypoxia along with her chest pain so CT scan done to evaluate for pulmonary embolus and this was negative.patient received Rocephin and Cipro already. Urine culture added on. Heart rate improved with fluids. Stable for admission to medicine.  ____________________________________________  FINAL CLINICAL IMPRESSION(S) / ED  DIAGNOSES  Final diagnoses:  Hypoxia  Acute cystitis without hematuria  Sepsis, due to unspecified organism [facility])    MEDICATIONS GIVEN DURING THIS VISIT:  Medications  sodium chloride 0.9 % bolus 1,000 mL (0 mLs Intravenous Stopped 08/28/17 2134)    And  sodium chloride 0.9 % bolus 500 mL (0 mLs Intravenous Stopped 08/28/17 2103)    And  sodium chloride 0.9 % bolus 250 mL (0 mLs Intravenous Stopped 08/28/17 2049)  cefTRIAXone (ROCEPHIN) 1 g in dextrose 5 % 50 mL IVPB (0 g Intravenous Stopped 08/28/17 2136)  azithromycin (ZITHROMAX) 500 mg in dextrose 5 % 250 mL IVPB (0 mg Intravenous Stopped 08/28/17 2220)  HYDROcodone-acetaminophen (NORCO/VICODIN) 5-325 MG per tablet 1 tablet (1 tablet Oral Given 08/28/17 2145)  iopamidol (ISOVUE-370) 76 % injection 100 mL (100 mLs Intravenous Contrast Given 08/28/17 2155)  acetaminophen (TYLENOL) tablet 1,000 mg (1,000 mg Oral Given 08/28/17 2239)    NEW OUTPATIENT MEDICATIONS STARTED DURING THIS VISIT:  New Prescriptions   No medications on file    Note:  This document was prepared  using Systems analyst and may include unintentional dictation errors.    Amori Cooperman, Corene Cornea, MD 08/29/17 [phone]

## 2017-08-30 DIAGNOSIS — N390 Urinary tract infection, site not specified: Principal | ICD-10-CM

## 2017-08-30 MED ORDER — BUDESONIDE-FORMOTEROL FUMARATE 160-4.5 MCG/ACT IN AERO: 2.0000 | INHALATION_SPRAY | Freq: Two times a day (BID) | RESPIRATORY_TRACT | 12 refills | Status: AC

## 2017-08-30 MED ORDER — CIPROFLOXACIN HCL 500 MG PO TABS: 500.0000 mg | ORAL_TABLET | Freq: Two times a day (BID) | ORAL | 0 refills | Status: AC

## 2017-08-30 NOTE — NC FL2 (Signed)
[place] LEVEL OF CARE SCREENING TOOL     IDENTIFICATION  Patient Name: Rachel Dodson Birthdate: 11-27-1946 Sex: female Admission Date (Current Location): 08/28/2017  [facility] and [place] Number:  [employer] and Address:  [place] [address], [place]      Provider Number: [id number]  Attending Physician Name and Address:  Isaac Bliss, [place] Name and Phone Number:       Current Level of Care: Hospital Recommended Level of Care: [place] Prior Approval Number:    Date Approved/Denied:   PASRR Number:    Discharge Plan: Other (Comment) [facility]F)    Current Diagnoses: Patient Active Problem List   Diagnosis Date Noted  . Acute bronchiolitis due to respiratory syncytial virus (RSV)   . HCAP (healthcare-associated pneumonia) 01/11/2017  . Right humeral fracture 10/18/2016  . Laceration of fingers without complication 56/31/4970  . Hypokalemia 01/28/2014  . ARF (acute renal failure) ([place]) 01/25/2014  . Rhabdomyolysis 01/25/2014  . Dehydration 01/25/2014  . Fall 01/25/2014  . Dementia 01/25/2014  . COPD with acute exacerbation ([place]) 01/08/2014  . Hyponatremia 11/10/2013  . Orthostatic hypotension 10/23/2013  . Leukocytosis, unspecified 10/23/2013  . Acute respiratory failure with hypoxia ([place]) 10/23/2013  . Lung nodule 10/20/2013  . UTI (urinary tract infection) 10/20/2013    Orientation RESPIRATION BLADDER Height & Weight     Self  O2 (1L) Continent Weight: 131 lb 9.6 oz (59.7 kg) Height:  5\' 3"  (160 cm)  BEHAVIORAL SYMPTOMS/MOOD NEUROLOGICAL BOWEL NUTRITION STATUS      Continent Diet (Soft. Low Sodium, heart healthy)  AMBULATORY STATUS COMMUNICATION OF NEEDS Skin   Limited Assist Verbally Normal                       Personal Care Assistance Level of Assistance  Bathing, Feeding, Dressing Bathing Assistance: Limited assistance Feeding assistance: Limited  assistance Dressing Assistance: Limited assistance     Functional Limitations Info  Sight, Hearing, Speech Sight Info: Adequate Hearing Info: Adequate Speech Info: Adequate    SPECIAL CARE FACTORS FREQUENCY                       Contractures Contractures Info: Not present    Additional Factors Info  Code Status, Allergies Code Status Info: Full Code Allergies Info: Latex           Current Medications (08/30/2017):  This is the current hospital active medication list Current Facility-Administered Medications  Medication Dose Route Frequency Provider Last Rate Last Dose  . acetaminophen (TYLENOL) tablet 650 mg  650 mg Oral Q4H PRN Orvan Falconer, MD   650 mg at 08/30/17 2637  . amitriptyline (ELAVIL) tablet 100 mg  100 mg Oral Daily Isaac Bliss, Rayford Halsted, MD   100 mg at 08/30/17 [phone]  . cefTRIAXone (ROCEPHIN) 1 g in dextrose 5 % 50 mL IVPB  1 g Intravenous Q24H Orvan Falconer, MD   Stopped at 08/29/17 2117  . clonazePAM (KLONOPIN) tablet 1 mg  1 mg Oral BID Orvan Falconer, MD   1 mg at 08/30/17 0842  . docusate sodium (COLACE) capsule 100 mg  100 mg Oral BID Orvan Falconer, MD   100 mg at 08/30/17 0843  . enoxaparin (LOVENOX) injection 40 mg  40 mg Subcutaneous Q24H Orvan Falconer, MD   40 mg at 08/30/17 0842  . FLUoxetine (PROZAC) capsule 40 mg  40 mg Oral Daily  Orvan Falconer, MD   40 mg at 08/30/17 [phone]  . HYDROcodone-acetaminophen (NORCO/VICODIN) 5-325 MG per tablet 1 tablet  1 tablet Oral Q6H PRN Isaac Bliss, Rayford Halsted, MD   1 tablet at 08/29/17 2103  . levothyroxine (SYNTHROID, LEVOTHROID) tablet 25 mcg  25 mcg Oral QAC breakfast Orvan Falconer, MD   25 mcg at 08/30/17 [phone]  . MEDLINE mouth rinse  15 mL Mouth Rinse BID Orvan Falconer, MD   15 mL at 08/30/17 0843  . ondansetron (ZOFRAN) injection 4 mg  4 mg Intravenous Q6H PRN Isaac Bliss, Rayford Halsted, MD   4 mg at 08/30/17 9242  . pantoprazole (PROTONIX) EC tablet 40 mg  40 mg Oral Daily Orvan Falconer, MD   40 mg at 08/30/17 0842  . polyethylene  glycol powder (GLYCOLAX/MIRALAX) container 17 g  17 g Oral Daily PRN Orvan Falconer, MD      . sodium chloride flush (NS) 0.9 % injection 3 mL  3 mL Intravenous Q12H Orvan Falconer, MD   3 mL at 08/30/17 0844     Discharge Medications:           TAKE these medications            albuterol 108 (90 Base) MCG/ACT inhaler Commonly known as:  PROVENTIL HFA;VENTOLIN HFA Inhale 2 puffs into the lungs every 4 (four) hours as needed for wheezing or shortness of breath.    amitriptyline 100 MG tablet Commonly known as:  ELAVIL Take 1 tablet (100 mg total) by mouth at bedtime. What changed:  when to take this    amLODipine 5 MG tablet Commonly known as:  NORVASC Take 5 mg by mouth 2 (two) times daily.    budesonide-formoterol 160-4.5 MCG/ACT inhaler Commonly known as:  SYMBICORT Inhale 2 puffs into the lungs 2 (two) times daily.    ciprofloxacin 500 MG tablet Commonly known as:  CIPRO Take 1 tablet (500 mg total) by mouth 2 (two) times daily.    clonazePAM 1 MG tablet Commonly known as:  KLONOPIN Take 1 mg by mouth 2 (two) times daily.    docusate sodium 100 MG capsule Commonly known as:  COLACE Take 100 mg by mouth 2 (two) times daily.    FLUoxetine 40 MG capsule Commonly known as:  PROZAC Take 40 mg by mouth daily.    hydrALAZINE 25 MG tablet Commonly known as:  APRESOLINE Take 25 mg by mouth 3 (three) times daily.    HYDROcodone-acetaminophen 5-325 MG tablet Commonly known as:  NORCO/VICODIN Take 1 tablet by mouth every 6 (six) hours as needed for severe pain. What changed:  reasons to take this    levothyroxine 25 MCG tablet Commonly known as:  SYNTHROID, LEVOTHROID Take 1 tablet (25 mcg total) by mouth daily before breakfast.    losartan 50 MG tablet Commonly known as:  COZAAR Take 1 tablet (50 mg total) by mouth daily.    pantoprazole 40 MG tablet Commonly known as:  PROTONIX Take 1 tablet (40 mg total) by mouth daily as needed (for acid reflux). What  changed:  when to take this    polyethylene glycol powder powder Commonly known as:  GLYCOLAX/MIRALAX Take 17 g by mouth daily as needed for mild constipation    .  Relevant Imaging Results:  Relevant Lab Results:   Additional Information SSN: [id number]  Ihor Gully, LCSW

## 2017-08-30 NOTE — Clinical Social Work Note (Addendum)
LCSW left a message for Merton Border advising of patient discharge back to [place] spoke with Aram Beecham at [facility] and advised of discharge, sent clinicals. Facility to pick patient up.  LCSW signing off.     Marianne Golightly, Clydene Pugh, LCSW

## 2017-08-30 NOTE — Progress Notes (Signed)
Patient discharged back to [facility].  Discharge order, prescriptions and medications discussed with patient.  Patient IV removed prior to discharge.  Patient in stable condition at discharge.  Patient discharged into the care of Jayme Cloud.

## 2017-08-30 NOTE — Care Management (Signed)
    Durable Medical Equipment        Start     Ordered   08/30/17 0000  For home use only DME oxygen    Question Answer Comment  Mode or (Route) Nasal cannula   Liters per Minute 3   Frequency Continuous (stationary and portable oxygen unit needed)   Oxygen delivery system Gas      08/30/17 1147    Patient with sats in the 80's on room air and at rest. Patient has COPD. IV antibiotics have been tried and failed.

## 2017-08-30 NOTE — Care Management Note (Signed)
Case Management Note  Patient Details  Name: Rachel Dodson MRN: [id number] Date of Birth: Apr 21, 1946    Expected Discharge Date:  08/30/17               Expected Discharge Plan:  Assisted Living / Rest Home  In-House Referral:  Clinical Social Work  Discharge planning Services  CM Consult  Post Acute Care Choice:  Durable Medical Equipment Choice offered to:  Patient  DME Arranged:    DME Agency:  [place]:    [place] Agency:     Status of Service:  Completed, signed off  If discussed at [employer] of Stay Meetings, dates discussed:    Additional Comments: Patient discharging today back to ALF. Will need oxygen. Offered Choice of DME agencies. Romualdo Bolk of [facility] notifiedd and will obtain orders from chart. Port tank to be delivered to room PTA, [facility] will deliver concentrator to [facility] once patient arrives home. Aram Beecham of [facility] aware and agreeable to plan.   Denyla Cortese, Chauncey Reading, RN 08/30/2017, 12:31 PM

## 2017-08-30 NOTE — Progress Notes (Signed)
SATURATION QUALIFICATIONS: (This note is used to comply with regulatory documentation for home oxygen)  Patient Saturations on Room Air at Rest = 50%  Patient Saturations on Room Air while Ambulating = 55%  Patient Saturations on 3 Liters of oxygen while Ambulating = 96%  Please briefly explain why patient needs home oxygen:

## 2017-08-30 NOTE — Care Management Important Message (Signed)
Important Message  Patient Details  Name: Rachel Dodson MRN: [id number] Date of Birth: October 11, 1946   Medicare Important Message Given:  Yes    Cheronda Erck, Chauncey Reading, RN 08/30/2017, 8:31 AM

## 2017-08-30 NOTE — Discharge Summary (Signed)
Physician Discharge Summary  Rachel Dodson MLY:[id number] DOB: 03-16-1946 DOA: 08/28/2017  PCP: Redmond School, MD  Admit date: 08/28/2017 Discharge date: 08/30/2017  Time spent: 45 minutes  Recommendations for Outpatient Follow-up:  -To be discharged home today. -Will need oxygen 24 hours a day. -Advised to follow-up with primary care provider 2 weeks.   Discharge Diagnoses:  Principal Problem:   UTI (urinary tract infection) Active Problems:   Acute respiratory failure with hypoxia (HCC)   COPD with acute exacerbation ([place])   Dementia   Discharge Condition: Stable and improved  Filed Weights   08/28/17 1957 08/29/17 0041  Weight: 54.4 kg (120 lb) 59.7 kg (131 lb 9.6 oz)    History of present illness:  As per Dr. Marin Comment on 9/5: Rachel Dodson is an 71 y.o. female with hx of dementia, chronic hyponatremia, HTN, anxiety, depression, presents to the ER not feeling well, having weakness and some SOB.  She denied CP, fever, chills, nausea vomiting or abdominal pain.  Evaluation in the ER included a CXR showed stable cardiomegaly, a UA showed TNTC WBCs, a WBC of 14K, and Cr of 1.0.  Her chest CT showed no PE, but there is a large hiatal hernia, with her stomach in the chest cavity, and evidence of pulmonary HTN.   She was given IVF, about 1.5 L in the ER.  And she was given oxygen supplement, as her Sat was low at 86%.  She was given IV Rocephin, and Hositalist was asked to admit her for hypoxia, UTI, and possible pulmonary edema.   Hospital Course:   UTI -Urine culture not ordered on admission, culture requested 24 hours post admission remains negative. -Will DC on cipro for 5 days.  Acute hypoxemic respiratory failure -Is requiring oxygen with sats in the 70-80s on RA. -Chest CT negative for PE and no evidence of pulmonary edema. -Suspect she has a chronic oxygen need given her baseline COPD (which is not exacerbated at present). -Will arrange for home  oxygen.  Procedures:  None   Consultations:  None  Discharge Instructions  Discharge Instructions    Diet - low sodium heart healthy    Complete by:  As directed    For home use only DME oxygen    Complete by:  As directed    Mode or (Route):  Nasal cannula   Liters per Minute:  3   Frequency:  Continuous (stationary and portable oxygen unit needed)   Oxygen delivery system:  Gas   Increase activity slowly    Complete by:  As directed      Allergies as of 08/30/2017      Reactions   Latex       Medication List    TAKE these medications   albuterol 108 (90 Base) MCG/ACT inhaler Commonly known as:  PROVENTIL HFA;VENTOLIN HFA Inhale 2 puffs into the lungs every 4 (four) hours as needed for wheezing or shortness of breath.   amitriptyline 100 MG tablet Commonly known as:  ELAVIL Take 1 tablet (100 mg total) by mouth at bedtime. What changed:  when to take this   amLODipine 5 MG tablet Commonly known as:  NORVASC Take 5 mg by mouth 2 (two) times daily.   budesonide-formoterol 160-4.5 MCG/ACT inhaler Commonly known as:  SYMBICORT Inhale 2 puffs into the lungs 2 (two) times daily.   ciprofloxacin 500 MG tablet Commonly known as:  CIPRO Take 1 tablet (500 mg total) by mouth 2 (two) times daily.  clonazePAM 1 MG tablet Commonly known as:  KLONOPIN Take 1 mg by mouth 2 (two) times daily.   docusate sodium 100 MG capsule Commonly known as:  COLACE Take 100 mg by mouth 2 (two) times daily.   FLUoxetine 40 MG capsule Commonly known as:  PROZAC Take 40 mg by mouth daily.   hydrALAZINE 25 MG tablet Commonly known as:  APRESOLINE Take 25 mg by mouth 3 (three) times daily.   HYDROcodone-acetaminophen 5-325 MG tablet Commonly known as:  NORCO/VICODIN Take 1 tablet by mouth every 6 (six) hours as needed for severe pain. What changed:  reasons to take this   levothyroxine 25 MCG tablet Commonly known as:  SYNTHROID, LEVOTHROID Take 1 tablet (25 mcg total) by  mouth daily before breakfast.   losartan 50 MG tablet Commonly known as:  COZAAR Take 1 tablet (50 mg total) by mouth daily.   pantoprazole 40 MG tablet Commonly known as:  PROTONIX Take 1 tablet (40 mg total) by mouth daily as needed (for acid reflux). What changed:  when to take this   polyethylene glycol powder powder Commonly known as:  GLYCOLAX/MIRALAX Take 17 g by mouth daily as needed for mild constipation.            Durable Medical Equipment        Start     Ordered   08/30/17 0000  For home use only DME oxygen    Question Answer Comment  Mode or (Route) Nasal cannula   Liters per Minute 3   Frequency Continuous (stationary and portable oxygen unit needed)   Oxygen delivery system Gas      08/30/17 1147       Discharge Care Instructions        Start     Ordered   08/30/17 0000  budesonide-formoterol (SYMBICORT) 160-4.5 MCG/ACT inhaler  2 times daily     08/30/17 1147   08/30/17 0000  For home use only DME oxygen    Question Answer Comment  Mode or (Route) Nasal cannula   Liters per Minute 3   Frequency Continuous (stationary and portable oxygen unit needed)   Oxygen delivery system Gas      08/30/17 1147   08/30/17 0000  ciprofloxacin (CIPRO) 500 MG tablet  2 times daily     08/30/17 1147   08/30/17 0000  Increase activity slowly     08/30/17 1147   08/30/17 0000  Diet - low sodium heart healthy     08/30/17 1147     Allergies  Allergen Reactions  . Latex    Follow-up Information    Redmond School, MD. Schedule an appointment as soon as possible for a visit in 2 week(s).   Specialty:  Internal Medicine Contact information: [address] [place] [address] [phone]            The results of significant diagnostics from this hospitalization (including imaging, microbiology, ancillary and laboratory) are listed below for reference.    Significant Diagnostic Studies: Dg Chest 2 View  Result Date: 08/28/2017 CLINICAL  DATA:  Possible pneumonia. EXAM: CHEST  2 VIEW COMPARISON:  Radiographs of May 30, 2017. FINDINGS: Stable cardiomegaly. Atherosclerosis of thoracic aorta is noted. No pneumothorax is noted. Large hiatal hernia is noted which is significantly increased in size compared to prior exam. No pneumothorax or pleural effusion is noted. Bony thorax is unremarkable. IMPRESSION: Aortic atherosclerosis. Stable cardiomegaly. Very large hiatal hernia is noted which is significantly increased in size compared to  prior exam. Electronically Signed   By: Marijo Conception, M.D.   On: 08/28/2017 21:22   Ct Angio Chest Pe W And/or Wo Contrast  Result Date: 08/28/2017 CLINICAL DATA:  PE suspected, high pretest prob. Shortness of breath, abdominal pain and vomiting. EXAM: CT ANGIOGRAPHY CHEST WITH CONTRAST TECHNIQUE: Multidetector CT imaging of the chest was performed using the standard protocol during bolus administration of intravenous contrast. Multiplanar CT image reconstructions and MIPs were obtained to evaluate the vascular anatomy. CONTRAST:  100 cc Isovue 370 IV COMPARISON:  Chest radiograph earlier this day. Chest CT 11/24/2015, additional priors FINDINGS: Cardiovascular: There are no filling defects within the pulmonary arteries to suggest pulmonary embolus. Dilatation of main pulmonary artery measuring 3.8 cm suggesting pulmonary arterial hypertension. Heart is displaced anteriorly by a large hiatal hernia. Tortuous atherosclerotic thoracic aorta without frank aneurysm. There are coronary artery calcifications. Small pericardial effusion measures up to 13 mm in depth adjacent to the left heart border. Mediastinum/Nodes: Large hiatal hernia containing ingested material with air-fluid level, the stomach is near entirely intrathoracic. This causes displacement of the heart anteriorly. The upper esophagus is patulous. There is no mediastinal or hilar adenopathy. Trachea and mainstem bronchi are patent. Lungs/Pleura: Mild smooth  septal thickening at the lung apices. Right upper lobe patchy ground-glass opacity likely pulmonary edema. Superior segment right lower lobe pulmonary nodule measures 14 x 13 mm, unchanged. No new pulmonary nodule. Compressive atelectasis adjacent to large hiatal hernia. Small right and trace left pleural effusion. Incidental bilateral breast implants. Upper Abdomen: The entire stomach is intrathoracic with large hiatal hernia. No acute upper abdominal abnormality. Musculoskeletal: Exaggerated thoracic kyphosis. Multilevel degenerative change in the thoracic spine. No compression fracture. Remote fracture of the sternum and right proximal humerus. No acute or suspicious osseous abnormality. Review of the MIP images confirms the above findings. IMPRESSION: 1. No pulmonary embolus. Dilated main pulmonary artery consistent with pulmonary arterial hypertension. 2. Mild pulmonary edema. Small right and trace left pleural effusion. 3. Large hiatal hernia, near entire stomach is intrathoracic. Stomach is distended with ingested contents with air-fluid levels. 4. Superior segment right lower lobe pulmonary nodule measures 1.4 x 1.3 cm, unchanged from chest CT dating back to 2015 consistent with benign nodule. 5. Aortic atherosclerosis.  Coronary artery calcifications. Aortic Atherosclerosis (ICD10-I70.0). Electronically Signed   By: Jeb Levering M.D.   On: 08/28/2017 22:17    Microbiology: Recent Results (from the past 240 hour(s))  Blood Culture (routine x 2)     Status: None (Preliminary result)   Collection Time: 08/28/17  8:49 PM  Result Value Ref Range Status   Specimen Description BLOOD LEFT ARM  Final   Special Requests   Final    BOTTLES DRAWN AEROBIC AND ANAEROBIC Blood Culture adequate volume   Culture NO GROWTH 2 DAYS  Final   Report Status PENDING  Incomplete  Blood Culture (routine x 2)     Status: None (Preliminary result)   Collection Time: 08/28/17  8:54 PM  Result Value Ref Range Status    Specimen Description BLOOD LEFT HAND  Final   Special Requests   Final    BOTTLES DRAWN AEROBIC AND ANAEROBIC Blood Culture adequate volume   Culture NO GROWTH 2 DAYS  Final   Report Status PENDING  Incomplete  MRSA PCR Screening     Status: None   Collection Time: 08/29/17 12:59 AM  Result Value Ref Range Status   MRSA by PCR NEGATIVE NEGATIVE Final    Comment:  The GeneXpert MRSA Assay (FDA approved for NASAL specimens only), is one component of a comprehensive MRSA colonization surveillance program. It is not intended to diagnose MRSA infection nor to guide or monitor treatment for MRSA infections.      Labs: Basic Metabolic Panel:  Recent Labs Lab 08/28/17 2015 08/29/17 0608  NA 123* 127*  K 3.8 3.9  CL 85* 89*  CO2 28 28  GLUCOSE 161* 135*  BUN 36* 37*  CREATININE 1.03* 0.98  CALCIUM 9.7 9.5   Liver Function Tests:  Recent Labs Lab 08/28/17 2015  AST 16  ALT 13*  ALKPHOS 68  BILITOT 0.5  PROT 6.6  ALBUMIN 3.9   No results for input(s): LIPASE, AMYLASE in the last 168 hours. No results for input(s): AMMONIA in the last 168 hours. CBC:  Recent Labs Lab 08/28/17 2015 08/29/17 0608  WBC 13.9* 15.3*  NEUTROABS 12.5*  --   HGB 11.4* 10.6*  HCT 33.1* 31.3*  MCV 92.7 93.7  PLT 295 350   Cardiac Enzymes: No results for input(s): CKTOTAL, CKMB, CKMBINDEX, TROPONINI in the last 168 hours. BNP: BNP (last 3 results) No results for input(s): BNP in the last 8760 hours.  ProBNP (last 3 results) No results for input(s): PROBNP in the last 8760 hours.  CBG: No results for input(s): GLUCAP in the last 168 hours.     SignedLelon Frohlich  [facility] Pager: [phone] 08/30/2017, 11:48 AM

## 2017-08-31 ENCOUNTER — Emergency Department (HOSPITAL_COMMUNITY): Payer: Medicare Other

## 2017-08-31 ENCOUNTER — Encounter (HOSPITAL_COMMUNITY): Admission: EM | Disposition: E | Payer: Self-pay | Source: Home / Self Care | Attending: Pulmonary Disease

## 2017-08-31 ENCOUNTER — Inpatient Hospital Stay (HOSPITAL_COMMUNITY)
Admission: EM | Admit: 2017-08-31 | Discharge: 2017-09-23 | DRG: 380 | Disposition: E | Payer: Medicare Other | Attending: Pulmonary Disease | Admitting: Pulmonary Disease

## 2017-08-31 ENCOUNTER — Encounter (HOSPITAL_COMMUNITY): Payer: Self-pay | Admitting: Emergency Medicine

## 2017-08-31 ENCOUNTER — Inpatient Hospital Stay (HOSPITAL_COMMUNITY): Payer: Medicare Other

## 2017-08-31 DIAGNOSIS — K449 Diaphragmatic hernia without obstruction or gangrene: Secondary | ICD-10-CM | POA: Diagnosis present

## 2017-08-31 DIAGNOSIS — J90 Pleural effusion, not elsewhere classified: Secondary | ICD-10-CM | POA: Diagnosis present

## 2017-08-31 DIAGNOSIS — Z7951 Long term (current) use of inhaled steroids: Secondary | ICD-10-CM

## 2017-08-31 DIAGNOSIS — N390 Urinary tract infection, site not specified: Secondary | ICD-10-CM | POA: Diagnosis not present

## 2017-08-31 DIAGNOSIS — R296 Repeated falls: Secondary | ICD-10-CM | POA: Diagnosis not present

## 2017-08-31 DIAGNOSIS — E871 Hypo-osmolality and hyponatremia: Secondary | ICD-10-CM | POA: Diagnosis present

## 2017-08-31 DIAGNOSIS — F17210 Nicotine dependence, cigarettes, uncomplicated: Secondary | ICD-10-CM | POA: Diagnosis not present

## 2017-08-31 DIAGNOSIS — Z79899 Other long term (current) drug therapy: Secondary | ICD-10-CM | POA: Diagnosis not present

## 2017-08-31 DIAGNOSIS — I129 Hypertensive chronic kidney disease with stage 1 through stage 4 chronic kidney disease, or unspecified chronic kidney disease: Secondary | ICD-10-CM | POA: Diagnosis not present

## 2017-08-31 DIAGNOSIS — F0390 Unspecified dementia without behavioral disturbance: Secondary | ICD-10-CM | POA: Diagnosis present

## 2017-08-31 DIAGNOSIS — J810 Acute pulmonary edema: Secondary | ICD-10-CM | POA: Diagnosis not present

## 2017-08-31 DIAGNOSIS — K922 Gastrointestinal hemorrhage, unspecified: Secondary | ICD-10-CM | POA: Diagnosis present

## 2017-08-31 DIAGNOSIS — E876 Hypokalemia: Secondary | ICD-10-CM | POA: Diagnosis not present

## 2017-08-31 DIAGNOSIS — J811 Chronic pulmonary edema: Secondary | ICD-10-CM

## 2017-08-31 DIAGNOSIS — J9811 Atelectasis: Secondary | ICD-10-CM | POA: Diagnosis not present

## 2017-08-31 DIAGNOSIS — K2211 Ulcer of esophagus with bleeding: Secondary | ICD-10-CM | POA: Diagnosis not present

## 2017-08-31 DIAGNOSIS — J988 Other specified respiratory disorders: Secondary | ICD-10-CM | POA: Diagnosis not present

## 2017-08-31 DIAGNOSIS — K219 Gastro-esophageal reflux disease without esophagitis: Secondary | ICD-10-CM | POA: Diagnosis present

## 2017-08-31 DIAGNOSIS — J9601 Acute respiratory failure with hypoxia: Secondary | ICD-10-CM

## 2017-08-31 DIAGNOSIS — J189 Pneumonia, unspecified organism: Secondary | ICD-10-CM

## 2017-08-31 DIAGNOSIS — N179 Acute kidney failure, unspecified: Secondary | ICD-10-CM | POA: Diagnosis not present

## 2017-08-31 DIAGNOSIS — I472 Ventricular tachycardia: Secondary | ICD-10-CM | POA: Diagnosis not present

## 2017-08-31 DIAGNOSIS — B9620 Unspecified Escherichia coli [E. coli] as the cause of diseases classified elsewhere: Secondary | ICD-10-CM | POA: Diagnosis present

## 2017-08-31 DIAGNOSIS — I959 Hypotension, unspecified: Secondary | ICD-10-CM | POA: Diagnosis not present

## 2017-08-31 DIAGNOSIS — G9340 Encephalopathy, unspecified: Secondary | ICD-10-CM | POA: Diagnosis present

## 2017-08-31 DIAGNOSIS — E8770 Fluid overload, unspecified: Secondary | ICD-10-CM | POA: Diagnosis not present

## 2017-08-31 DIAGNOSIS — Z66 Do not resuscitate: Secondary | ICD-10-CM | POA: Diagnosis present

## 2017-08-31 DIAGNOSIS — N182 Chronic kidney disease, stage 2 (mild): Secondary | ICD-10-CM | POA: Diagnosis not present

## 2017-08-31 DIAGNOSIS — J690 Pneumonitis due to inhalation of food and vomit: Secondary | ICD-10-CM | POA: Diagnosis not present

## 2017-08-31 DIAGNOSIS — E878 Other disorders of electrolyte and fluid balance, not elsewhere classified: Secondary | ICD-10-CM | POA: Diagnosis present

## 2017-08-31 DIAGNOSIS — Z4682 Encounter for fitting and adjustment of non-vascular catheter: Secondary | ICD-10-CM | POA: Diagnosis not present

## 2017-08-31 DIAGNOSIS — F329 Major depressive disorder, single episode, unspecified: Secondary | ICD-10-CM | POA: Diagnosis not present

## 2017-08-31 DIAGNOSIS — F419 Anxiety disorder, unspecified: Secondary | ICD-10-CM | POA: Diagnosis present

## 2017-08-31 DIAGNOSIS — J449 Chronic obstructive pulmonary disease, unspecified: Secondary | ICD-10-CM | POA: Diagnosis present

## 2017-08-31 DIAGNOSIS — Z515 Encounter for palliative care: Secondary | ICD-10-CM | POA: Diagnosis present

## 2017-08-31 DIAGNOSIS — Z452 Encounter for adjustment and management of vascular access device: Secondary | ICD-10-CM | POA: Diagnosis not present

## 2017-08-31 DIAGNOSIS — Z978 Presence of other specified devices: Secondary | ICD-10-CM

## 2017-08-31 DIAGNOSIS — I493 Ventricular premature depolarization: Secondary | ICD-10-CM | POA: Diagnosis present

## 2017-08-31 DIAGNOSIS — R091 Pleurisy: Secondary | ICD-10-CM | POA: Diagnosis not present

## 2017-08-31 DIAGNOSIS — E1165 Type 2 diabetes mellitus with hyperglycemia: Secondary | ICD-10-CM | POA: Diagnosis present

## 2017-08-31 DIAGNOSIS — I4891 Unspecified atrial fibrillation: Secondary | ICD-10-CM | POA: Diagnosis present

## 2017-08-31 DIAGNOSIS — J9690 Respiratory failure, unspecified, unspecified whether with hypoxia or hypercapnia: Secondary | ICD-10-CM | POA: Diagnosis not present

## 2017-08-31 DIAGNOSIS — Z01818 Encounter for other preprocedural examination: Secondary | ICD-10-CM

## 2017-08-31 DIAGNOSIS — D62 Acute posthemorrhagic anemia: Secondary | ICD-10-CM | POA: Diagnosis not present

## 2017-08-31 DIAGNOSIS — R578 Other shock: Secondary | ICD-10-CM | POA: Diagnosis present

## 2017-08-31 DIAGNOSIS — E1122 Type 2 diabetes mellitus with diabetic chronic kidney disease: Secondary | ICD-10-CM | POA: Diagnosis present

## 2017-08-31 DIAGNOSIS — Z91040 Latex allergy status: Secondary | ICD-10-CM

## 2017-08-31 DIAGNOSIS — Z90710 Acquired absence of both cervix and uterus: Secondary | ICD-10-CM | POA: Diagnosis not present

## 2017-08-31 DIAGNOSIS — E039 Hypothyroidism, unspecified: Secondary | ICD-10-CM | POA: Diagnosis present

## 2017-08-31 DIAGNOSIS — E874 Mixed disorder of acid-base balance: Secondary | ICD-10-CM | POA: Diagnosis present

## 2017-08-31 DIAGNOSIS — K921 Melena: Secondary | ICD-10-CM | POA: Diagnosis not present

## 2017-08-31 DIAGNOSIS — Z9889 Other specified postprocedural states: Secondary | ICD-10-CM

## 2017-08-31 HISTORY — PX: ESOPHAGOGASTRODUODENOSCOPY: SHX5428

## 2017-08-31 LAB — URINALYSIS, ROUTINE W REFLEX MICROSCOPIC
Bilirubin Urine: NEGATIVE
GLUCOSE, UA: NEGATIVE mg/dL
Hgb urine dipstick: NEGATIVE
KETONES UR: NEGATIVE mg/dL
Nitrite: NEGATIVE
PH: 5 (ref 5.0–8.0)
PROTEIN: NEGATIVE mg/dL
SPECIFIC GRAVITY, URINE: 1.014 (ref 1.005–1.030)

## 2017-08-31 LAB — ABO/RH: ABO/RH(D): A POS

## 2017-08-31 LAB — I-STAT CHEM 8, ED
BUN: 78 mg/dL — ABNORMAL HIGH (ref 6–20)
CALCIUM ION: 1.22 mmol/L (ref 1.15–1.40)
CHLORIDE: 94 mmol/L — AB (ref 101–111)
Creatinine, Ser: 1.6 mg/dL — ABNORMAL HIGH (ref 0.44–1.00)
Glucose, Bld: 135 mg/dL — ABNORMAL HIGH (ref 65–99)
HEMATOCRIT: 24 % — AB (ref 36.0–46.0)
Hemoglobin: 8.2 g/dL — ABNORMAL LOW (ref 12.0–15.0)
Potassium: 4.7 mmol/L (ref 3.5–5.1)
SODIUM: 131 mmol/L — AB (ref 135–145)
TCO2: 26 mmol/L (ref 22–32)

## 2017-08-31 LAB — CBC WITH DIFFERENTIAL/PLATELET
BASOS ABS: 0 10*3/uL (ref 0.0–0.1)
BASOS PCT: 0 %
Basophils Absolute: 0 10*3/uL (ref 0.0–0.1)
Basophils Relative: 0 %
EOS ABS: 0 10*3/uL (ref 0.0–0.7)
Eosinophils Absolute: 0 10*3/uL (ref 0.0–0.7)
Eosinophils Relative: 0 %
Eosinophils Relative: 0 %
HEMATOCRIT: 27.2 % — AB (ref 36.0–46.0)
HEMATOCRIT: 34 % — AB (ref 36.0–46.0)
HEMOGLOBIN: 11.2 g/dL — AB (ref 12.0–15.0)
Hemoglobin: 9.1 g/dL — ABNORMAL LOW (ref 12.0–15.0)
LYMPHS ABS: 2.2 10*3/uL (ref 0.7–4.0)
LYMPHS PCT: 7 %
LYMPHS PCT: 5 %
Lymphs Abs: 1.6 10*3/uL (ref 0.7–4.0)
MCH: 32.7 pg (ref 26.0–34.0)
MCH: 29.6 pg (ref 26.0–34.0)
MCHC: 33.5 g/dL (ref 30.0–36.0)
MCHC: 32.9 g/dL (ref 30.0–36.0)
MCV: 97.8 fL (ref 78.0–100.0)
MCV: 89.7 fL (ref 78.0–100.0)
MONOS PCT: 7 %
MONOS PCT: 4 %
Monocytes Absolute: 2.2 10*3/uL — ABNORMAL HIGH (ref 0.1–1.0)
Monocytes Absolute: 1.3 10*3/uL — ABNORMAL HIGH (ref 0.1–1.0)
NEUTROS ABS: 29.6 10*3/uL — AB (ref 1.7–7.7)
NEUTROS PCT: 91 %
Neutro Abs: 27.3 10*3/uL — ABNORMAL HIGH (ref 1.7–7.7)
Neutrophils Relative %: 86 %
PLATELETS: 355 10*3/uL (ref 150–400)
Platelets: 235 10*3/uL (ref 150–400)
RBC: 2.78 MIL/uL — AB (ref 3.87–5.11)
RBC: 3.79 MIL/uL — ABNORMAL LOW (ref 3.87–5.11)
RDW: 14 % (ref 11.5–15.5)
RDW: 16.2 % — ABNORMAL HIGH (ref 11.5–15.5)
WBC: 31.7 10*3/uL — AB (ref 4.0–10.5)
WBC: 32.5 10*3/uL — ABNORMAL HIGH (ref 4.0–10.5)

## 2017-08-31 LAB — PROCALCITONIN: PROCALCITONIN: 1.04 ng/mL

## 2017-08-31 LAB — POCT I-STAT 3, ART BLOOD GAS (G3+)
Acid-base deficit: 7 mmol/L — ABNORMAL HIGH (ref 0.0–2.0)
Bicarbonate: 18.5 mmol/L — ABNORMAL LOW (ref 20.0–28.0)
O2 Saturation: 97 %
PCO2 ART: 35.5 mmHg (ref 32.0–48.0)
Patient temperature: 98.6
TCO2: 20 mmol/L — AB (ref 22–32)
pH, Arterial: 7.324 — ABNORMAL LOW (ref 7.350–7.450)
pO2, Arterial: 96 mmHg (ref 83.0–108.0)

## 2017-08-31 LAB — BLOOD GAS, ARTERIAL
Acid-base deficit: 4.9 mmol/L — ABNORMAL HIGH (ref 0.0–2.0)
Bicarbonate: 20.3 mmol/L (ref 20.0–28.0)
DRAWN BY: 28459
FIO2: 36
MECHVT: 500 mL
O2 Saturation: 97.3 %
PEEP: 5 cmH2O
Patient temperature: 37
RATE: 16 resp/min
pCO2 arterial: 37.6 mmHg (ref 32.0–48.0)
pH, Arterial: 7.341 — ABNORMAL LOW (ref 7.350–7.450)
pO2, Arterial: 98.7 mmHg (ref 83.0–108.0)

## 2017-08-31 LAB — I-STAT CG4 LACTIC ACID, ED: Lactic Acid, Venous: 3.45 mmol/L (ref 0.5–1.9)

## 2017-08-31 LAB — POC OCCULT BLOOD, ED: Fecal Occult Bld: POSITIVE — AB

## 2017-08-31 LAB — PROTIME-INR
INR: 1.91
PROTHROMBIN TIME: 21.7 s — AB (ref 11.4–15.2)

## 2017-08-31 LAB — COMPREHENSIVE METABOLIC PANEL
ALT: 12 U/L — ABNORMAL LOW (ref 14–54)
AST: 23 U/L (ref 15–41)
Albumin: 3.4 g/dL — ABNORMAL LOW (ref 3.5–5.0)
Alkaline Phosphatase: 46 U/L (ref 38–126)
Anion gap: 10 (ref 5–15)
BILIRUBIN TOTAL: 0.4 mg/dL (ref 0.3–1.2)
BUN: 69 mg/dL — AB (ref 6–20)
CO2: 25 mmol/L (ref 22–32)
Calcium: 8.7 mg/dL — ABNORMAL LOW (ref 8.9–10.3)
Chloride: 96 mmol/L — ABNORMAL LOW (ref 101–111)
Creatinine, Ser: 1.62 mg/dL — ABNORMAL HIGH (ref 0.44–1.00)
GFR calc Af Amer: 36 mL/min — ABNORMAL LOW (ref >60)
GFR, EST NON AFRICAN AMERICAN: 31 mL/min — AB (ref >60)
Glucose, Bld: 142 mg/dL — ABNORMAL HIGH (ref 65–99)
POTASSIUM: 4.7 mmol/L (ref 3.5–5.1)
Sodium: 131 mmol/L — ABNORMAL LOW (ref 135–145)
TOTAL PROTEIN: 5.6 g/dL — AB (ref 6.5–8.1)

## 2017-08-31 LAB — PREPARE RBC (CROSSMATCH)

## 2017-08-31 LAB — MRSA PCR SCREENING: MRSA by PCR: NEGATIVE

## 2017-08-31 LAB — TROPONIN I

## 2017-08-31 LAB — LIPASE, BLOOD: LIPASE: 23 U/L (ref 11–51)

## 2017-08-31 LAB — APTT: APTT: 32 s (ref 24–36)

## 2017-08-31 LAB — GLUCOSE, CAPILLARY: GLUCOSE-CAPILLARY: 120 mg/dL — AB (ref 65–99)

## 2017-08-31 SURGERY — EGD (ESOPHAGOGASTRODUODENOSCOPY)
Anesthesia: Moderate Sedation

## 2017-08-31 MED ORDER — FENTANYL CITRATE (PF) 100 MCG/2ML IJ SOLN
50.0000 ug | INTRAMUSCULAR | Status: DC | PRN
  Administered 2017-08-31: 50 ug via INTRAVENOUS
  Filled 2017-08-31 (×2): qty 2

## 2017-08-31 MED ORDER — MIDAZOLAM HCL 5 MG/ML IJ SOLN
INTRAMUSCULAR | Status: AC
  Filled 2017-08-31: qty 2

## 2017-08-31 MED ORDER — PANTOPRAZOLE SODIUM 40 MG IV SOLR
8.0000 mg/h | INTRAVENOUS | Status: DC
  Administered 2017-08-31 – 2017-09-02 (×5): 8 mg/h via INTRAVENOUS
  Filled 2017-08-31 (×10): qty 80

## 2017-08-31 MED ORDER — EPINEPHRINE PF 1 MG/ML IJ SOLN
0.5000 mg | Freq: Once | INTRAMUSCULAR | Status: AC
  Administered 2017-08-31: 0.5 mg via INTRAVENOUS

## 2017-08-31 MED ORDER — PIPERACILLIN-TAZOBACTAM 3.375 G IVPB
3.3750 g | Freq: Three times a day (TID) | INTRAVENOUS | Status: DC
  Administered 2017-08-31 – 2017-09-03 (×9): 3.375 g via INTRAVENOUS
  Filled 2017-08-31 (×9): qty 50

## 2017-08-31 MED ORDER — ROCURONIUM BROMIDE 50 MG/5ML IV SOLN
1.0000 mg/kg | Freq: Once | INTRAVENOUS | Status: DC
  Filled 2017-08-31: qty 5.97

## 2017-08-31 MED ORDER — SODIUM CHLORIDE 0.9 % IV SOLN
INTRAVENOUS | Status: DC
  Administered 2017-08-31: 08:00:00 via INTRAVENOUS

## 2017-08-31 MED ORDER — ORAL CARE MOUTH RINSE
15.0000 mL | Freq: Four times a day (QID) | OROMUCOSAL | Status: DC
  Administered 2017-08-31 – 2017-09-06 (×23): 15 mL via OROMUCOSAL

## 2017-08-31 MED ORDER — FENTANYL CITRATE (PF) 100 MCG/2ML IJ SOLN: 50.0000 ug | INTRAMUSCULAR | Status: DC | PRN

## 2017-08-31 MED ORDER — ASPIRIN 300 MG RE SUPP: 300.0000 mg | RECTAL | Status: AC

## 2017-08-31 MED ORDER — DEXTROSE 5 % IV SOLN
0.0000 ug/min | INTRAVENOUS | Status: DC
  Filled 2017-08-31: qty 4

## 2017-08-31 MED ORDER — SODIUM CHLORIDE 0.9 % IV SOLN
10.0000 ug/h | INTRAVENOUS | Status: DC
  Administered 2017-08-31: 25 ug/h via INTRAVENOUS
  Filled 2017-08-31: qty 50

## 2017-08-31 MED ORDER — ETOMIDATE 2 MG/ML IV SOLN
10.0000 mg | Freq: Once | INTRAVENOUS | Status: AC
  Administered 2017-08-31: 20 mg via INTRAVENOUS

## 2017-08-31 MED ORDER — PIPERACILLIN-TAZOBACTAM 3.375 G IVPB 30 MIN: 3.3750 g | Freq: Three times a day (TID) | INTRAVENOUS | Status: DC

## 2017-08-31 MED ORDER — MIDAZOLAM HCL 2 MG/2ML IJ SOLN
1.0000 mg | INTRAMUSCULAR | Status: DC | PRN
  Administered 2017-09-01 – 2017-09-02 (×3): 1 mg via INTRAVENOUS
  Filled 2017-08-31 (×3): qty 2

## 2017-08-31 MED ORDER — ONDANSETRON HCL 4 MG/2ML IJ SOLN: 4.0000 mg | Freq: Once | INTRAMUSCULAR | Status: DC

## 2017-08-31 MED ORDER — ASPIRIN 81 MG PO CHEW
324.0000 mg | CHEWABLE_TABLET | ORAL | Status: AC
  Administered 2017-08-31: 324 mg via ORAL
  Filled 2017-08-31: qty 4

## 2017-08-31 MED ORDER — FENTANYL 2500MCG IN NS 250ML (10MCG/ML) PREMIX INFUSION
0.0000 ug/h | INTRAVENOUS | Status: DC
  Administered 2017-08-31: 75 ug/h via INTRAVENOUS
  Administered 2017-09-01: 100 ug/h via INTRAVENOUS
  Administered 2017-09-02: 200 ug/h via INTRAVENOUS
  Filled 2017-08-31 (×3): qty 250

## 2017-08-31 MED ORDER — SODIUM CHLORIDE 0.9 % IV SOLN
250.0000 mL | INTRAVENOUS | Status: DC | PRN
Start: 2017-08-31 — End: 2017-09-01

## 2017-08-31 MED ORDER — SODIUM CHLORIDE 0.9 % IV SOLN
0.0000 ug/min | INTRAVENOUS | Status: DC
  Administered 2017-08-31: 80 ug/min via INTRAVENOUS
  Administered 2017-08-31: 20 ug/min via INTRAVENOUS
  Filled 2017-08-31 (×2): qty 1

## 2017-08-31 MED ORDER — FENTANYL CITRATE (PF) 100 MCG/2ML IJ SOLN
INTRAMUSCULAR | Status: DC | PRN
  Administered 2017-08-31: 25 ug via INTRAVENOUS

## 2017-08-31 MED ORDER — MIDAZOLAM HCL 2 MG/2ML IJ SOLN
1.0000 mg | INTRAMUSCULAR | Status: DC | PRN
  Filled 2017-08-31: qty 2

## 2017-08-31 MED ORDER — SODIUM CHLORIDE 0.9 % IV BOLUS (SEPSIS): 1000.0000 mL | Freq: Once | INTRAVENOUS | Status: DC

## 2017-08-31 MED ORDER — PROPOFOL 1000 MG/100ML IV EMUL
5.0000 ug/kg/min | Freq: Once | INTRAVENOUS | Status: AC
  Administered 2017-08-31: 10 ug/kg/min via INTRAVENOUS
  Filled 2017-08-31: qty 100

## 2017-08-31 MED ORDER — SODIUM CHLORIDE 0.9 % IV SOLN
0.0000 ug/min | INTRAVENOUS | Status: DC
  Administered 2017-08-31: 70 ug/min via INTRAVENOUS
  Administered 2017-09-01: 60 ug/min via INTRAVENOUS
  Filled 2017-08-31 (×3): qty 2

## 2017-08-31 MED ORDER — SODIUM CHLORIDE 0.9 % IV SOLN
80.0000 mg | Freq: Once | INTRAVENOUS | Status: AC
  Administered 2017-08-31: 80 mg via INTRAVENOUS
  Filled 2017-08-31 (×2): qty 80

## 2017-08-31 MED ORDER — MAGNESIUM SULFATE 50 % IJ SOLN
2.0000 g | Freq: Once | INTRAMUSCULAR | Status: AC
  Administered 2017-08-31: 2 g via INTRAVENOUS

## 2017-08-31 MED ORDER — CHLORHEXIDINE GLUCONATE 0.12% ORAL RINSE (MEDLINE KIT)
15.0000 mL | Freq: Two times a day (BID) | OROMUCOSAL | Status: DC
  Administered 2017-08-31 – 2017-09-06 (×12): 15 mL via OROMUCOSAL

## 2017-08-31 MED ORDER — SODIUM CHLORIDE 0.9 % IV BOLUS (SEPSIS)
1000.0000 mL | Freq: Once | INTRAVENOUS | Status: AC
  Administered 2017-08-31: 1000 mL via INTRAVENOUS

## 2017-08-31 NOTE — Consult Note (Signed)
Referring Provider:  ER MD Primary Care Physician:  Redmond School, MD Primary Gastroenterologist:  Althia Forts   Reason for Consultation:  Upper GI bleed   HPI: Rachel Dodson is a 71 y.o. female transferred from [place] to [facility] for further treatment of hemorrhagic shock. Patient was discharged yesterday after being treated for UTI. Past medical history significant for only hypertension. Patient was in the assisted-living facility. She started having nausea and vomiting followed by coffee-ground emesis and melena.  She was found to be hypotensive with blood pressure in 60s.She was intubated while in the ER for airway protection.  History obtained from chart review. Discussed with patient's cousin  who is her POA. Apparently no previous history of GI bleed but she has been having stomach issues with acid reflux for a long time. Had EGD in 2008 which was unremarkable. CT scan 08/28/2017 to rule out PE showed large hiatal hernia obtaining almost entire stomach.   Past Medical History:  Diagnosis Date  . Anxiety   . Chronic hyponatremia   . Depression   . Hypertension   . Recurrent falls     Past Surgical History:  Procedure Laterality Date  . ABDOMINAL HYSTERECTOMY    . VIDEO ASSISTED THORACOSCOPY Left 10/23/2013   Procedure: VIDEO ASSISTED THORACOSCOPY;  Surgeon: Grace Isaac, MD;  Location: [place];  Service: Thoracic;  Laterality: Left;  Marland Kitchen VIDEO BRONCHOSCOPY N/A 10/23/2013   Procedure: VIDEO BRONCHOSCOPY;  Surgeon: Grace Isaac, MD;  Location: [facility] OR;  Service: Thoracic;  Laterality: N/A;    Prior to Admission medications   Medication Sig Start Date End Date Taking? Authorizing Provider  albuterol (PROVENTIL HFA;VENTOLIN HFA) 108 (90 Base) MCG/ACT inhaler Inhale 2 puffs into the lungs every 4 (four) hours as needed for wheezing or shortness of breath. 05/30/17  Yes Noemi Chapel, MD  amitriptyline (ELAVIL) 100 MG tablet Take 1 tablet (100 mg total) by mouth at  bedtime. Patient taking differently: Take 100 mg by mouth daily.  10/19/16  Yes Verlee Monte, MD  amLODipine (NORVASC) 5 MG tablet Take 5 mg by mouth 2 (two) times daily.   Yes [provider]  budesonide-formoterol (SYMBICORT) 160-4.5 MCG/ACT inhaler Inhale 2 puffs into the lungs 2 (two) times daily. 08/30/17  Yes Isaac Bliss, Rayford Halsted, MD  ciprofloxacin (CIPRO) 500 MG tablet Take 1 tablet (500 mg total) by mouth 2 (two) times daily. 08/30/17 09/04/17 Yes Erline Hau, MD  clonazePAM (KLONOPIN) 1 MG tablet Take 1 mg by mouth 2 (two) times daily.   Yes [provider]  docusate sodium (COLACE) 100 MG capsule Take 100 mg by mouth 2 (two) times daily.   Yes [provider]  FLUoxetine (PROZAC) 40 MG capsule Take 40 mg by mouth daily.  08/05/17  Yes [provider]  hydrALAZINE (APRESOLINE) 25 MG tablet Take 25 mg by mouth 3 (three) times daily.   Yes [provider]  HYDROcodone-acetaminophen (NORCO/VICODIN) 5-325 MG tablet Take 1 tablet by mouth every 6 (six) hours as needed for severe pain. Patient taking differently: Take 1 tablet by mouth every 6 (six) hours as needed for moderate pain.  10/19/16  Yes Verlee Monte, MD  levothyroxine (SYNTHROID, LEVOTHROID) 25 MCG tablet Take 1 tablet (25 mcg total) by mouth daily before breakfast. 10/19/16  Yes Elmahi, Rae Lips, MD  losartan (COZAAR) 50 MG tablet Take 1 tablet (50 mg total) by mouth daily. 10/19/16  Yes Verlee Monte, MD  pantoprazole (PROTONIX) 40 MG tablet Take  1 tablet (40 mg total) by mouth daily as needed (for acid reflux). Patient taking differently: Take 40 mg by mouth daily.  10/19/16  Yes Verlee Monte, MD  polyethylene glycol powder (GLYCOLAX/MIRALAX) powder Take 17 g by mouth daily as needed for mild constipation.   Yes [provider]    Scheduled Meds: . ondansetron (ZOFRAN) IV  4 mg Intravenous Once  . rocuronium  1 mg/kg Intravenous Once   Continuous  Infusions: . sodium chloride Stopped (09/15/2017 0730)   And  . sodium chloride 125 mL/hr at 09/16/2017 0813  . fentaNYL infusion INTRAVENOUS 50 mcg/hr (09/05/2017 1234)  . pantoprazole (PROTONIX) IVPB    . pantoprozole (PROTONIX) infusion 8 mg/hr (09/10/2017 0653)  . phenylephrine (NEO-SYNEPHRINE) Adult infusion     PRN Meds:.fentaNYL (SUBLIMAZE) injection, fentaNYL (SUBLIMAZE) injection, midazolam, midazolam  Allergies as of 09/11/2017 - Review Complete 08/29/2017  Allergen Reaction Noted  . Latex  10/20/2013    No family history on file.  Social History   Social History  . Marital status: Divorced    Spouse name: N/A  . Number of children: N/A  . Years of education: N/A   Occupational History  . Not on file.   Social History Main Topics  . Smoking status: Current Every Day Smoker    Packs/day: 0.25    Years: 15.00    Types: Cigarettes  . Smokeless tobacco: Never Used  . Alcohol use No  . Drug use: No  . Sexual activity: Not Currently   Other Topics Concern  . Not on file   Social History Narrative  . No narrative on file    Review of Systems: Not able to obtain   Physical Exam: Vital signs: Vitals:   08/26/2017 1320 08/28/2017 1458  BP: 121/89   Pulse: (!) 102 (!) 103  Resp: 16 16  Temp:    SpO2: 94% 94%     General:  Open eyes to minimal stimuli,  Remains intubated. Lightly sedated. Lungs:  Coarse breath sounds bilaterally Heart: Tachycardia no murmurs, clicks, rubs,  or gallops. Abdomen: Soft, non-tender, nondistended, bowel sounds present. No peritoneal signs LE : No edema. Pulses intact CNS : not able to perform    GI:  Lab Results:  Recent Labs  08/28/17 2015 08/29/17 0608 08/28/2017 0610 08/27/2017 0620  WBC 13.9* 15.3*  --  31.7*  HGB 11.4* 10.6* 8.2* 9.1*  HCT 33.1* 31.3* 24.0* 27.2*  PLT 295 350  --  355   BMET  Recent Labs  08/28/17 2015 08/29/17 0608 09/20/2017 0610 09/08/2017 0620  NA 123* 127* 131* 131*  K 3.8 3.9 4.7 4.7  CL 85*  89* 94* 96*  CO2 28 28  --  25  GLUCOSE 161* 135* 135* 142*  BUN 36* 37* 78* 69*  CREATININE 1.03* 0.98 1.60* 1.62*  CALCIUM 9.7 9.5  --  8.7*   LFT  Recent Labs  09/08/2017 0620  PROT 5.6*  ALBUMIN 3.4*  AST 23  ALT 12*  ALKPHOS 46  BILITOT 0.4   PT/INR  Recent Labs  09/05/2017 0620  LABPROT 21.7*  INR 1.91     Studies/Results: Dg Chest Portable 1 View  Result Date: 08/29/2017 CLINICAL DATA:  ETT placement EXAM: PORTABLE CHEST 1 VIEW COMPARISON:  August 28, 2017 FINDINGS: The ETT terminates in the right mainstem bronchus. Recommend withdrawing 4 cm. A large hiatal hernia is identified. An NG tube is identified. The distal tip is flipped back on itself with the tip in the distal  esophagus but the side port possibly extending slightly into the stomach. Recommend repositioning. The hernia obscures the right heart border. However, the heart, hila, and mediastinum are unchanged. No pneumothorax. Probable atelectasis in the right base. No other acute abnormalities. IMPRESSION: 1. The ETT terminates at the origin of the right mainstem bronchus. Recommend withdrawing 4 cm. 2. NG tube placement as above with a large hiatal hernia. 3. Mild opacity in the right base is similar compared to previous studies and may represent atelectasis. Findings called to Dr. Wyvonnia Dusky. Electronically Signed   By: Dorise Bullion III M.D   On: 09/19/2017 07:02    Impression/Plan: - Coffee-ground emesis and melena with hemorrhagic shock. CT scan 09/05  showed large hiatal hernia with near entire stomach in the thoracic cavity - Hemorrhagic shock - Anemia. Repeat hemoglobin pending.  Recommendations ------------------------- - Patient may need EGD for further evaluation. Risks benefits and alternatives Discuss with patient's Cousin who  was present in the patient's room. - Also discussed with ICU attending Dr. Madalyn Rob. - Repeat CBC/ Blood work. Optimize vitals.  blood pressure is improving. Current blood  pressure is 88/60 - Further plan based on endoscopic finding.  Patient is critically ill. Currently admitted to the ICU. Approximately 35 minutes spent in patient's care.   LOS: 0 days   Otis Brace  MD, FACP 09/14/2017, 3:48 PM  Pager [phone] If no answer or after 5 PM call [phone]

## 2017-08-31 NOTE — Op Note (Signed)
[facility] Patient Name: Rachel Dodson Procedure Date : 08/30/2017 MRN: [id number] Attending MD: Otis Brace , MD Date of Birth: 1946-01-28 CSN: [id number] Age: 71 Admit Type: Inpatient Procedure:                Upper GI endoscopy Indications:              Coffee-ground emesis, Melena Providers:                Otis Brace, MD, Vista Lawman, RN, William Dalton, Technician Referring MD:              Medicines:                Fentanyl 25 micrograms IV, Midazolam 2 mg IV Complications:            No immediate complications. Estimated Blood Loss:     Estimated blood loss: none. Procedure:                Pre-Anesthesia Assessment:                           - Prior to the procedure, a History and Physical                            was performed, and patient medications and                            allergies were reviewed. The patient's tolerance of                            previous anesthesia was also reviewed. The risks                            and benefits of the procedure and the sedation                            options and risks were discussed with the patient.                            All questions were answered, and informed consent                            was obtained. Prior Anticoagulants: The patient has                            taken no previous anticoagulant or antiplatelet                            agents. ASA Grade Assessment: III - A patient with                            severe systemic disease. After reviewing the risks  and benefits, the patient was deemed in                            satisfactory condition to undergo the procedure.                           After obtaining informed consent, the endoscope was                            passed under direct vision. Throughout the                            procedure, the patient's blood pressure, pulse, and      oxygen saturations were monitored continuously. The                            EG-[id number] ([id number]) scope was introduced through the                            mouth, and advanced to the second part of duodenum.                            The upper GI endoscopy was technically difficult                            and complex due to the patient's discomfort during                            the procedure. The patient tolerated the procedure                            well. Scope In: Scope Out: Findings:      LA Grade D (one or more mucosal breaks involving at least 75% of       esophageal circumference) esophagitis with stigmata of recent bleeding       was found in the middle and distal esophagus.      A large hiatal hernia was present.      There is no endoscopic evidence of bleeding, ulceration or varices in       the entire examined stomach.      there was poor distention of the stomach probably from being into the       chest cavity.evidence of NG tube trauma in the fundus was noted.      The duodenal bulb, first portion of the duodenum and second portion of       the duodenum were normal. Impression:               - LA Grade D erosive esophagitis.                           - Large hiatal hernia.                           - Normal duodenal bulb, first portion of the  duodenum and second portion of the duodenum.                           - No specimens collected. Moderate Sedation:      Moderate (conscious) sedation was administered by the endoscopy nurse       and supervised by the endoscopist. The following parameters were       monitored: oxygen saturation, heart rate, blood pressure, and response       to care. Recommendation:           - Patient has a contact number available for                            emergencies. The signs and symptoms of potential                            delayed complications were discussed with the                             patient. Return to normal activities tomorrow.                            Written discharge instructions were provided to the                            patient.                           - NPO.                           - Continue present medications.                           - Repeat upper endoscopy in 2 months to check                            healing. Procedure Code(s):        --- Professional ---                           [phone], Esophagogastroduodenoscopy, flexible,                            transoral; diagnostic, including collection of                            specimen(s) by brushing or washing, when performed                            (separate procedure) Diagnosis Code(s):        --- Professional ---                           K20.8, Other esophagitis                           K44.9, Diaphragmatic hernia  without obstruction or                            gangrene                           K92.0, Hematemesis                           K92.1, Melena (includes Hematochezia) CPT copyright 2016 American Medical Association. All rights reserved. The codes documented in this report are preliminary and upon coder review may  be revised to meet current compliance requirements. Otis Brace, MD Otis Brace, MD 09/18/2017 5:23:53 PM Number of Addenda: 0

## 2017-08-31 NOTE — Brief Op Note (Signed)
09/09/2017  5:15 PM  PATIENT:  Hilarie Fredrickson  71 y.o. female  PRE-OPERATIVE DIAGNOSIS:  GI bleed/ Melena  POST-OPERATIVE DIAGNOSIS:  Esophagitis/ large hiatal hernia  PROCEDURE:  Procedure(s): ESOPHAGOGASTRODUODENOSCOPY (EGD) (N/A)  SURGEON:  Surgeon(s) and Role:    * Sabrinna Yearwood, MD - Primary  Findings/recommendations ----------------------------------- - EGD today showed large hiatal hernia with LA  grade D ulcerative and erosive esophagitis - No evidence of active bleeding in the stomach or duodenum. - continue  Protonix drip for now - Okay to extubate from GI standpoint. - Recommend repeat EGD in 2-3 months to document healing of esophagitis. - Monitor  H&H. GI will follow    Otis Brace MD, [place] 09/12/2017, 5:25 PM  Pager [phone]  If no answer or after 5 PM call [phone]

## 2017-08-31 NOTE — H&P (Signed)
PULMONARY / CRITICAL CARE MEDICINE   Name: Rachel Dodson MRN: [id number] DOB: 1946-02-07    ADMISSION DATE:  09/21/2017  REFERRING MD:  [facility]  CHIEF COMPLAINT:  Black emesis.  HISTORY OF PRESENT ILLNESS:   This is a 71 year old who was just discharged from this hospital on 95 after being treated for an Escherichia coli UTI. He otherwise has a history of dementia and chronic hyponatremia and is known to have a large hiatal hernia. At her skilled nursing facility she suffered from hematemesis and was felt to be passing melena. He was taken to the [facility] emergency room where she was intubated for airway protection. He received 2 units of packed red blood cells Very little effect on her blood pressure was hovering around 90. He was transferred to this hospital for stabilization and upper endoscopy. He seems to indicate no prior history of GI bleeding or liver disease, however responses are very inconsistent.  PAST MEDICAL HISTORY :  She  has a past medical history of Anxiety; Chronic hyponatremia; Depression; Hypertension; and Recurrent falls.  PAST SURGICAL HISTORY: She  has a past surgical history that includes Abdominal hysterectomy; Video bronchoscopy (N/A, 10/23/2013); and Video assisted thoracoscopy (Left, 10/23/2013).  Allergies  Allergen Reactions  . Latex     No current facility-administered medications on file prior to encounter.    Current Outpatient Prescriptions on File Prior to Encounter  Medication Sig  . albuterol (PROVENTIL HFA;VENTOLIN HFA) 108 (90 Base) MCG/ACT inhaler Inhale 2 puffs into the lungs every 4 (four) hours as needed for wheezing or shortness of breath.  Marland Kitchen amitriptyline (ELAVIL) 100 MG tablet Take 1 tablet (100 mg total) by mouth at bedtime. (Patient taking differently: Take 100 mg by mouth daily. )  . amLODipine (NORVASC) 5 MG tablet Take 5 mg by mouth 2 (two) times daily.  . budesonide-formoterol (SYMBICORT) 160-4.5 MCG/ACT inhaler Inhale 2 puffs  into the lungs 2 (two) times daily.  . ciprofloxacin (CIPRO) 500 MG tablet Take 1 tablet (500 mg total) by mouth 2 (two) times daily.  . clonazePAM (KLONOPIN) 1 MG tablet Take 1 mg by mouth 2 (two) times daily.  Marland Kitchen docusate sodium (COLACE) 100 MG capsule Take 100 mg by mouth 2 (two) times daily.  Marland Kitchen FLUoxetine (PROZAC) 40 MG capsule Take 40 mg by mouth daily.   . hydrALAZINE (APRESOLINE) 25 MG tablet Take 25 mg by mouth 3 (three) times daily.  Marland Kitchen HYDROcodone-acetaminophen (NORCO/VICODIN) 5-325 MG tablet Take 1 tablet by mouth every 6 (six) hours as needed for severe pain. (Patient taking differently: Take 1 tablet by mouth every 6 (six) hours as needed for moderate pain. )  . levothyroxine (SYNTHROID, LEVOTHROID) 25 MCG tablet Take 1 tablet (25 mcg total) by mouth daily before breakfast.  . losartan (COZAAR) 50 MG tablet Take 1 tablet (50 mg total) by mouth daily.  . pantoprazole (PROTONIX) 40 MG tablet Take 1 tablet (40 mg total) by mouth daily as needed (for acid reflux). (Patient taking differently: Take 40 mg by mouth daily. )  . polyethylene glycol powder (GLYCOLAX/MIRALAX) powder Take 17 g by mouth daily as needed for mild constipation.    FAMILY HISTORY:  Her has no family status information on file.    SOCIAL HISTORY: She  reports that she has been smoking Cigarettes.  She has a 3.75 pack-year smoking history. She has never used smokeless tobacco. She reports that she does not drink alcohol or use drugs.  REVIEW OF SYSTEMS:  She is intubated and not to questions but a review of systems is unobtainable SUBJECTIVE:  Likewise unobtainable  VITAL SIGNS: BP 121/89   Pulse (!) 103   Temp (!) 95.7 F (35.4 C) (Core (Comment))   Resp 16   Ht 5\' 1"  (1.549 m)   Wt 130 lb 15.3 oz (59.4 kg)   SpO2 94%   BMI 24.74 kg/m   HEMODYNAMICS:    VENTILATOR SETTINGS: Vent Mode: PRVC FiO2 (%):  [36 %-40 %] 40 % Set Rate:  [16 bmp] 16 bmp Vt Set:  [400 mL-500 mL] 400 mL PEEP:  [5 cmH20] 5  cmH20 Plateau Pressure:  [19 cmH20-24 cmH20] 24 cmH20  INTAKE / OUTPUT: No intake/output data recorded.  PHYSICAL EXAMINATION: General:  An elderly white female who was orally intubated and mechanically ventilated. He does intermittently respond to questions. Neuro: Opens eyes to voice and follows simple instructions moving all fours. He was a equal and reactive. Cardiovascular:  S1 and S2 are regular with a 2/6 systolic ejection murmur. There is no JVD and there is no dependent edema. Lungs: There is symmetric air movement, some scattered rhonchi and no wheezes, and a normal I:E ratio. Abdomen:  The abdomen is soft without any overt organomegaly masses tenderness or guarding. She is anicteric.   LABS:  BMET  Recent Labs Lab 08/28/17 2015 08/29/17 0608 08/28/2017 0610 08/29/2017 0620  NA 123* 127* 131* 131*  K 3.8 3.9 4.7 4.7  CL 85* 89* 94* 96*  CO2 28 28  --  25  BUN 36* 37* 78* 69*  CREATININE 1.03* 0.98 1.60* 1.62*  GLUCOSE 161* 135* 135* 142*    Electrolytes  Recent Labs Lab 08/28/17 2015 08/29/17 0608 09/14/2017 0620  CALCIUM 9.7 9.5 8.7*    CBC  Recent Labs Lab 08/28/17 2015 08/29/17 0608 08/25/2017 0610 09/22/2017 0620  WBC 13.9* 15.3*  --  31.7*  HGB 11.4* 10.6* 8.2* 9.1*  HCT 33.1* 31.3* 24.0* 27.2*  PLT 295 350  --  355    Coag's  Recent Labs Lab 08/26/2017 0620  APTT 32  INR 1.91    Sepsis Markers  Recent Labs Lab 08/28/17 2015 08/24/2017 0611  LATICACIDVEN 1.1 3.45*    ABG  Recent Labs Lab 09/16/2017 0623 08/26/2017 1622  PHART 7.341* 7.324*  PCO2ART 37.6 35.5  PO2ART 98.7 96.0    Liver Enzymes  Recent Labs Lab 08/28/17 2015 09/14/2017 0620  AST 16 23  ALT 13* 12*  ALKPHOS 68 46  BILITOT 0.5 0.4  ALBUMIN 3.9 3.4*    Cardiac Enzymes  Recent Labs Lab 09/21/2017 0622  TROPONINI <0.03    Glucose  Recent Labs Lab 09/14/2017 1442  GLUCAP 120*    Imaging Dg Chest Port 1 View  Result Date: 09/07/2017 CLINICAL DATA:   Patient status post intubation. EXAM: PORTABLE CHEST 1 VIEW COMPARISON:  Chest radiograph 09/11/2017 FINDINGS: Patient is rotated to the left, limiting evaluation. Enteric tube courses inferior to the diaphragm. ET tube projects at the distal trachea, exact location is difficult to discern given patient rotation. Stable cardiomegaly. Low lung volumes. Large hiatal hernia. Probable bibasilar atelectasis. No definite pneumothorax. IMPRESSION: Marked leftward patient rotation limits evaluation for exact location of the ET tube. The distal tip of the tube appears to be at the distal trachea/carina. Recommend repeat evaluation with AP positioning as well as possible mild retraction of ET tube prior to reassessment. Discussed with Nurse Etheleen Mayhew, RN at 420 p.m. on 09/05/2017. Cardiomegaly. Probable basilar atelectasis. Electronically  Signed   By: Lovey Newcomer M.D.   On: 08/29/2017 16:25   Dg Chest Portable 1 View  Result Date: 09/10/2017 CLINICAL DATA:  ETT placement EXAM: PORTABLE CHEST 1 VIEW COMPARISON:  August 28, 2017 FINDINGS: The ETT terminates in the right mainstem bronchus. Recommend withdrawing 4 cm. A large hiatal hernia is identified. An NG tube is identified. The distal tip is flipped back on itself with the tip in the distal esophagus but the side port possibly extending slightly into the stomach. Recommend repositioning. The hernia obscures the right heart border. However, the heart, hila, and mediastinum are unchanged. No pneumothorax. Probable atelectasis in the right base. No other acute abnormalities. IMPRESSION: 1. The ETT terminates at the origin of the right mainstem bronchus. Recommend withdrawing 4 cm. 2. NG tube placement as above with a large hiatal hernia. 3. Mild opacity in the right base is similar compared to previous studies and may represent atelectasis. Findings called to Dr. Wyvonnia Dusky. Electronically Signed   By: Dorise Bullion III M.D   On: 08/30/2017 07:02     STUDIES:   Repeat chest x-ray shows an endotracheal tube that is appropriately placed and suggest a right basilar infiltrate  CULTURES: Uterine culture is pending  ANTIBIOTICS: Zosyn started on admission on 9/8  SIGNIFICANT EVENTS:   LINES/TUBES:  ASSESSMENT / PLAN:  1. Upper GI bleeding. He has no history of known liver disease and normal liver function studies therefore I have not started her on octreotide infusion will be maintaining her on a tonic infusion alone. Anticipating EGD within the hour. Continue monitoring resuscitation and repeat CBC is pending and we will be monitoring her hourly urine output. I am concerned with her leukocytosis and her failure to respond to volume, GI bleeding is not the only provocation for her marginal blood pressure. I have covered her empirically for potential aspiration. 2. Chronic hyponatremia. Urine and serum osmolalities have been ordered on with a TSH and a random a.m. Cortisol. 3. Respiratory failure. She was intubated for airway protection. Should she fail to evolve an infiltrate am anticipating rapid extubation.   35 minutes was spent in the care of this acutely ill patient today   Lars Masson, MD Pulmonary and [place] Pager: [phone]  09/11/2017, 4:39 PM

## 2017-08-31 NOTE — Progress Notes (Signed)
[facility] Progress Note Patient Name: CECIL BIXBY DOB: 03-27-1946 MRN: [id number]   Date of Service  09/16/2017  HPI/Events of Note  Patient arrives intubated and mechanically ventilated with Dx of GI bleeding. No H&P in EPIC. CXR this AM reveals R mainstem ETT placement. No repeat CXR. BP = 87/54 and HR = 103.  eICU Interventions  Will order: 1. Mechanical Ventilation - 40%/PRVC 16/TV 400/P 5. 2. ABG now. 3. Portable CXR now.  5. CBC with platelet count now.  6. Phenylephrine IV infusion. Titrate to MAP > 65. 7. Fentanyl IV infusion. Titrate to RASS = 0.      Intervention Category Evaluation Type: New Patient Evaluation  Lysle Dingwall 09/08/2017, 4:08 PM

## 2017-08-31 NOTE — Progress Notes (Signed)
ETT retracted 3cm per MD order. (Order under RN order in order history). ETT secured at the 22cm mark.

## 2017-08-31 NOTE — ED Notes (Signed)
Pt had large black soft stool. Peri care provided

## 2017-08-31 NOTE — Progress Notes (Signed)
Wasted 253ml Fentanyl in the Sink witnessed by Scot Jun, Therapist, sports.

## 2017-08-31 NOTE — ED Notes (Signed)
BAIR placed on pt.  Cousin at bedside. Updated by Dr Wyvonnia Dusky

## 2017-08-31 NOTE — ED Triage Notes (Signed)
Pt from [facility] and brought in for vomiting and weakness.

## 2017-08-31 NOTE — ED Provider Notes (Signed)
[facility] DEPT Provider Note   CSN: [id number] Arrival date & time: 08/30/2017  0547     History   Chief Complaint Chief Complaint  Patient presents with  . Weakness    HPI Rachel Dodson is a 71 y.o. female.  Level V caveat for acuity of condition. Patient arrives in extremis from assisted living facility. She was found to be weak, hypotensive and minimally responsive this morning. He was discharged from the hospital 2 days ago.Patient was found to have black emesis on her face and also has melena in her diaper. She is awake but very weak and difficult to arouse. Initial blood pressure was 60 per EMS. No history of GI bleed in the past for alcoholic liver disease.Patient states she would like everything done to keep her alive.   The history is provided by the patient, the EMS personnel and a caregiver. The history is limited by the condition of the patient.  Weakness     Past Medical History:  Diagnosis Date  . Anxiety   . Chronic hyponatremia   . Depression   . Hypertension   . Recurrent falls     Patient Active Problem List   Diagnosis Date Noted  . Hemorrhagic shock ([place]) 08/25/2017  . Acute bronchiolitis due to respiratory syncytial virus (RSV)   . HCAP (healthcare-associated pneumonia) 01/11/2017  . Right humeral fracture 10/18/2016  . Laceration of fingers without complication 82/99/3716  . Hypokalemia 01/28/2014  . ARF (acute renal failure) ([place]) 01/25/2014  . Rhabdomyolysis 01/25/2014  . Dehydration 01/25/2014  . Fall 01/25/2014  . Dementia 01/25/2014  . COPD with acute exacerbation ([place]) 01/08/2014  . Hyponatremia 11/10/2013  . Orthostatic hypotension 10/23/2013  . Leukocytosis, unspecified 10/23/2013  . Acute respiratory failure with hypoxia ([place]) 10/23/2013  . Lung nodule 10/20/2013  . UTI (urinary tract infection) 10/20/2013    Past Surgical History:  Procedure Laterality Date  . ABDOMINAL HYSTERECTOMY    . VIDEO ASSISTED THORACOSCOPY Left  10/23/2013   Procedure: VIDEO ASSISTED THORACOSCOPY;  Surgeon: Grace Isaac, MD;  Location: [facility];  Service: Thoracic;  Laterality: Left;  Marland Kitchen VIDEO BRONCHOSCOPY N/A 10/23/2013   Procedure: VIDEO BRONCHOSCOPY;  Surgeon: Grace Isaac, MD;  Location: [facility] OR;  Service: Thoracic;  Laterality: N/A;    OB History    No data available       Home Medications    Prior to Admission medications   Medication Sig Start Date End Date Taking? Authorizing Provider  albuterol (PROVENTIL HFA;VENTOLIN HFA) 108 (90 Base) MCG/ACT inhaler Inhale 2 puffs into the lungs every 4 (four) hours as needed for wheezing or shortness of breath. 05/30/17   Noemi Chapel, MD  amitriptyline (ELAVIL) 100 MG tablet Take 1 tablet (100 mg total) by mouth at bedtime. Patient taking differently: Take 100 mg by mouth daily.  10/19/16   Verlee Monte, MD  amLODipine (NORVASC) 5 MG tablet Take 5 mg by mouth 2 (two) times daily.    [provider]  budesonide-formoterol (SYMBICORT) 160-4.5 MCG/ACT inhaler Inhale 2 puffs into the lungs 2 (two) times daily. 08/30/17   Isaac Bliss, Rayford Halsted, MD  ciprofloxacin (CIPRO) 500 MG tablet Take 1 tablet (500 mg total) by mouth 2 (two) times daily. 08/30/17 09/04/17  Isaac Bliss, Rayford Halsted, MD  clonazePAM (KLONOPIN) 1 MG tablet Take 1 mg by mouth 2 (two) times daily.    [provider]  docusate sodium (COLACE) 100 MG capsule Take 100 mg by mouth 2 (two) times daily.  [provider]  FLUoxetine (PROZAC) 40 MG capsule Take 40 mg by mouth daily.  08/05/17   [provider]  hydrALAZINE (APRESOLINE) 25 MG tablet Take 25 mg by mouth 3 (three) times daily.    [provider]  HYDROcodone-acetaminophen (NORCO/VICODIN) 5-325 MG tablet Take 1 tablet by mouth every 6 (six) hours as needed for severe pain. Patient taking differently: Take 1 tablet by mouth every 6 (six) hours as needed for moderate pain.  10/19/16   Verlee Monte, MD  levothyroxine  (SYNTHROID, LEVOTHROID) 25 MCG tablet Take 1 tablet (25 mcg total) by mouth daily before breakfast. 10/19/16   Verlee Monte, MD  losartan (COZAAR) 50 MG tablet Take 1 tablet (50 mg total) by mouth daily. 10/19/16   Verlee Monte, MD  pantoprazole (PROTONIX) 40 MG tablet Take 1 tablet (40 mg total) by mouth daily as needed (for acid reflux). Patient taking differently: Take 40 mg by mouth daily.  10/19/16   Verlee Monte, MD  polyethylene glycol powder (GLYCOLAX/MIRALAX) powder Take 17 g by mouth daily as needed for mild constipation.    [provider]    Family History No family history on file.  Social History Social History  Substance Use Topics  . Smoking status: Current Every Day Smoker    Packs/day: 0.25    Years: 15.00    Types: Cigarettes  . Smokeless tobacco: Never Used  . Alcohol use No     Allergies   Latex   Review of Systems Review of Systems  Unable to perform ROS: Acuity of condition  Neurological: Negative for weakness.     Physical Exam Updated Vital Signs BP (!) 146/74   Pulse 100   Temp (!) 95.7 F (35.4 C) (Core (Comment))   Resp 16   SpO2 99%   Physical Exam  Constitutional: She appears distressed.  Obtunded, very pale-appearing Minimally responsive  HENT:  Head: Normocephalic and atraumatic.  Right Ear: External ear normal.  Left Ear: External ear normal.  Dark emesis around nose. Dark liquid in mouth  Eyes: Pupils are equal, round, and reactive to light. Conjunctivae and EOM are normal.  Neck: Normal range of motion. Neck supple.  Cardiovascular: Regular rhythm.   Murmur heard. tachycardic  Pulmonary/Chest: Effort normal. No respiratory distress.  Abdominal: Soft. There is tenderness.  Musculoskeletal: Normal range of motion. She exhibits no edema, tenderness or deformity.  Neurological: She is alert.  Patient is intermittently alert. She is obtunded but will follow some commands. She is difficult to stay awake. She is  moving all extremities  Skin: Capillary refill takes more than 3 seconds. There is pallor.     ED Treatments / Results  Labs (all labs ordered are listed, but only abnormal results are displayed) Labs Reviewed  COMPREHENSIVE METABOLIC PANEL - Abnormal; Notable for the following:       Result Value   Sodium 131 (*)    Chloride 96 (*)    Glucose, Bld 142 (*)    BUN 69 (*)    Creatinine, Ser 1.62 (*)    Calcium 8.7 (*)    Total Protein 5.6 (*)    Albumin 3.4 (*)    ALT 12 (*)    GFR calc non Af Amer 31 (*)    GFR calc Af Amer 36 (*)    All other components within normal limits  CBC WITH DIFFERENTIAL/PLATELET - Abnormal; Notable for the following:    WBC 31.7 (*)    RBC 2.78 (*)  Hemoglobin 9.1 (*)    HCT 27.2 (*)    Neutro Abs 27.3 (*)    Monocytes Absolute 2.2 (*)    All other components within normal limits  PROTIME-INR - Abnormal; Notable for the following:    Prothrombin Time 21.7 (*)    All other components within normal limits  BLOOD GAS, ARTERIAL - Abnormal; Notable for the following:    pH, Arterial 7.341 (*)    Acid-base deficit 4.9 (*)    All other components within normal limits  URINALYSIS, ROUTINE W REFLEX MICROSCOPIC - Abnormal; Notable for the following:    Leukocytes, UA TRACE (*)    Bacteria, UA RARE (*)    Squamous Epithelial / LPF 0-5 (*)    All other components within normal limits  I-STAT CG4 LACTIC ACID, ED - Abnormal; Notable for the following:    Lactic Acid, Venous 3.45 (*)    All other components within normal limits  I-STAT CHEM 8, ED - Abnormal; Notable for the following:    Sodium 131 (*)    Chloride 94 (*)    BUN 78 (*)    Creatinine, Ser 1.60 (*)    Glucose, Bld 135 (*)    Hemoglobin 8.2 (*)    HCT 24.0 (*)    All other components within normal limits  POC OCCULT BLOOD, ED - Abnormal; Notable for the following:    Fecal Occult Bld POSITIVE (*)    All other components within normal limits  URINE CULTURE  LIPASE, BLOOD  APTT    TROPONIN I  TYPE AND SCREEN  ABO/RH  PREPARE RBC (CROSSMATCH)    EKG  EKG Interpretation  Date/Time:  Saturday August 31 2017 06:30:00 EDT Ventricular Rate:  107 PR Interval:    QRS Duration: 146 QT Interval:  395 QTC Calculation: 527 R Axis:   99 Text Interpretation:  Sinus tachycardia Supraventricular bigeminy RBBB and LPFB No significant change was found Confirmed by Ezequiel Essex [phone]) on 09/02/2017 6:32:52 AM       Radiology Dg Chest Portable 1 View  Result Date: 08/27/2017 CLINICAL DATA:  ETT placement EXAM: PORTABLE CHEST 1 VIEW COMPARISON:  August 28, 2017 FINDINGS: The ETT terminates in the right mainstem bronchus. Recommend withdrawing 4 cm. A large hiatal hernia is identified. An NG tube is identified. The distal tip is flipped back on itself with the tip in the distal esophagus but the side port possibly extending slightly into the stomach. Recommend repositioning. The hernia obscures the right heart border. However, the heart, hila, and mediastinum are unchanged. No pneumothorax. Probable atelectasis in the right base. No other acute abnormalities. IMPRESSION: 1. The ETT terminates at the origin of the right mainstem bronchus. Recommend withdrawing 4 cm. 2. NG tube placement as above with a large hiatal hernia. 3. Mild opacity in the right base is similar compared to previous studies and may represent atelectasis. Findings called to Dr. Wyvonnia Dusky. Electronically Signed   By: Dorise Bullion III M.D   On: 09/01/2017 07:02    Procedures Procedure Name: Intubation Date/Time: 08/25/2017 7:44 AM Performed by: Ezequiel Essex Pre-anesthesia Checklist: Patient identified, Emergency Drugs available, Suction available, Timeout performed and Patient being monitored Oxygen Delivery Method: Ambu bag Preoxygenation: Pre-oxygenation with 100% oxygen Induction Type: Rapid sequence and IV induction Ventilation: Mask ventilation without difficulty Laryngoscope Size: Mac and  4 Grade View: Grade I Tube type: Subglottic suction tube Tube size: 7.5 mm Number of attempts: 1 Airway Equipment and Method: Video-laryngoscopy,  Stylet and Rigid stylet  Placement Confirmation: Breath sounds checked- equal and bilateral,  Positive ETCO2 and ETT inserted through vocal cords under direct vision Secured at: 24 cm Tube secured with: ETT holder Dental Injury: Teeth and Oropharynx as per pre-operative assessment  Future Recommendations: Recommend- induction with short-acting agent, and alternative techniques readily available      (including critical care time)  Medications Ordered in ED Medications  sodium chloride 0.9 % bolus 1,000 mL (1,000 mLs Intravenous New Bag/Given 08/29/2017 6945)    And  sodium chloride 0.9 % bolus 1,000 mL (not administered)    And  0.9 %  sodium chloride infusion (not administered)  pantoprazole (PROTONIX) 80 mg in sodium chloride 0.9 % 100 mL IVPB (not administered)  pantoprazole (PROTONIX) 80 mg in sodium chloride 0.9 % 250 mL (0.32 mg/mL) infusion (8 mg/hr Intravenous New Bag/Given 09/21/2017 0653)  ondansetron (ZOFRAN) injection 4 mg (not administered)  fentaNYL (SUBLIMAZE) injection 50 mcg (not administered)  fentaNYL (SUBLIMAZE) injection 50 mcg (not administered)  midazolam (VERSED) injection 1 mg (not administered)  midazolam (VERSED) injection 1 mg (not administered)  rocuronium (ZEMURON) injection 59.7 mg (not administered)  magnesium sulfate (IV Push/IM) injection 2 g (2 g Intravenous Given 09/03/2017 0616)  EPINEPHrine (ADRENALIN) 0.5 mg (0.5 mg Intravenous Given 09/15/2017 0612)  etomidate (AMIDATE) injection 10 mg (20 mg Intravenous Given 09/05/2017 0617)     Initial Impression / Assessment and Plan / ED Course  I have reviewed the triage vital signs and the nursing notes.  Pertinent labs & imaging results that were available during my care of the patient were reviewed by me and considered in my medical decision making (see chart for  details).    Patient arrives in extremis, weak, pale, hypotensive with likely GI bleed. IV access established. IV fluids emergency release blood ordered.  Patient given IV PPI as well as pRBCs. She did consent to blood transfusion prior to intubation. Patient states she wants everything done to stabilize. Hemoglobin has dropped 2 grams in the past 2 days.   Patient intubated for airway protection given her obtunded mental status. She did have a few episodes of ventricular tachycardia lasting a few seconds each. She was loaded with magnesium. Pulses were never lost.  Patient intubated andOG placed with large amount of bloody emesis.  Patient has 2 cousins who were updated by phone and in person. She has no living will her power of attorney.  Case discussed with [facility] Tamala Julian at [facility] ICU who accepts patient. D/w [facility] as well.    Angiocath insertion Performed by: Ezequiel Essex  Consent: Verbal consent obtained. Risks and benefits: risks, benefits and alternatives were discussed Time out: Immediately prior to procedure a "time out" was called to verify the correct patient, procedure, equipment, support staff and site/side marked as required.  Preparation: Patient was prepped and draped in the usual sterile fashion.  Vein Location: L IJ   Yes Ultrasound Guided  Gauge: 20  Normal blood return and flush without difficulty Patient tolerance: Patient tolerated the procedure well with no immediate complications.  CRITICAL CARE Performed by: Ezequiel Essex Total critical care time: 60 minutes Critical care time was exclusive of separately billable procedures and treating other patients. Critical care was necessary to treat or prevent imminent or life-threatening deterioration. Critical care was time spent personally by me on the following activities: development of treatment plan with patient and/or surrogate as well as nursing, discussions with consultants,  evaluation of patient's response to treatment, examination of patient, obtaining history  from patient or surrogate, ordering and performing treatments and interventions, ordering and review of laboratory studies, ordering and review of radiographic studies, pulse oximetry and re-evaluation of patient's condition.     Final Clinical Impressions(s) / ED Diagnoses   Final diagnoses:  Hemorrhagic shock ([place])  Gastrointestinal hemorrhage, unspecified gastrointestinal hemorrhage type  Acute respiratory failure with hypoxia [facility])    New Prescriptions New Prescriptions   No medications on file     Ezequiel Essex, MD 08/30/2017 0800

## 2017-09-01 ENCOUNTER — Encounter (HOSPITAL_COMMUNITY): Payer: Self-pay | Admitting: Gastroenterology

## 2017-09-01 DIAGNOSIS — J988 Other specified respiratory disorders: Secondary | ICD-10-CM

## 2017-09-01 DIAGNOSIS — K921 Melena: Secondary | ICD-10-CM

## 2017-09-01 LAB — BASIC METABOLIC PANEL
Anion gap: 4 — ABNORMAL LOW (ref 5–15)
BUN: 32 mg/dL — AB (ref 6–20)
CO2: 17 mmol/L — ABNORMAL LOW (ref 22–32)
CREATININE: 1.4 mg/dL — AB (ref 0.44–1.00)
Calcium: 7.4 mg/dL — ABNORMAL LOW (ref 8.9–10.3)
Chloride: 120 mmol/L — ABNORMAL HIGH (ref 101–111)
GFR, EST AFRICAN AMERICAN: 42 mL/min — AB (ref >60)
GFR, EST NON AFRICAN AMERICAN: 37 mL/min — AB (ref >60)
Glucose, Bld: 112 mg/dL — ABNORMAL HIGH (ref 65–99)
Potassium: 3.3 mmol/L — ABNORMAL LOW (ref 3.5–5.1)
SODIUM: 141 mmol/L (ref 135–145)

## 2017-09-01 LAB — TYPE AND SCREEN
ABO/RH(D): A POS
ANTIBODY SCREEN: NEGATIVE
UNIT DIVISION: 0
UNIT DIVISION: 0

## 2017-09-01 LAB — CBC WITH DIFFERENTIAL/PLATELET
BASOS ABS: 0 10*3/uL (ref 0.0–0.1)
BASOS PCT: 0 %
EOS ABS: 0 10*3/uL (ref 0.0–0.7)
Eosinophils Relative: 0 %
HEMATOCRIT: 31.2 % — AB (ref 36.0–46.0)
HEMOGLOBIN: 10.6 g/dL — AB (ref 12.0–15.0)
LYMPHS PCT: 4 %
Lymphs Abs: 1.3 10*3/uL (ref 0.7–4.0)
MCH: 30.8 pg (ref 26.0–34.0)
MCHC: 34 g/dL (ref 30.0–36.0)
MCV: 90.7 fL (ref 78.0–100.0)
MONOS PCT: 4 %
Monocytes Absolute: 1.3 10*3/uL — ABNORMAL HIGH (ref 0.1–1.0)
NEUTROS PCT: 92 %
Neutro Abs: 30.4 10*3/uL — ABNORMAL HIGH (ref 1.7–7.7)
PLATELETS: 261 10*3/uL (ref 150–400)
RBC: 3.44 MIL/uL — ABNORMAL LOW (ref 3.87–5.11)
RDW: 17.3 % — ABNORMAL HIGH (ref 11.5–15.5)
WBC: 33 10*3/uL — ABNORMAL HIGH (ref 4.0–10.5)

## 2017-09-01 LAB — BPAM RBC
BLOOD PRODUCT EXPIRATION DATE: 201809242359
Blood Product Expiration Date: 201809262359
ISSUE DATE / TIME: 201809080615
ISSUE DATE / TIME: 201809080615
UNIT TYPE AND RH: 9500
Unit Type and Rh: 9500

## 2017-09-01 LAB — COMPREHENSIVE METABOLIC PANEL
ALT: 9 U/L — ABNORMAL LOW (ref 14–54)
ANION GAP: 7 (ref 5–15)
AST: 14 U/L — ABNORMAL LOW (ref 15–41)
Albumin: 2.1 g/dL — ABNORMAL LOW (ref 3.5–5.0)
Alkaline Phosphatase: 34 U/L — ABNORMAL LOW (ref 38–126)
BUN: 46 mg/dL — ABNORMAL HIGH (ref 6–20)
CHLORIDE: 111 mmol/L (ref 101–111)
CO2: 20 mmol/L — AB (ref 22–32)
Calcium: 7.4 mg/dL — ABNORMAL LOW (ref 8.9–10.3)
Creatinine, Ser: 1.59 mg/dL — ABNORMAL HIGH (ref 0.44–1.00)
GFR calc non Af Amer: 31 mL/min — ABNORMAL LOW (ref >60)
GFR, EST AFRICAN AMERICAN: 36 mL/min — AB (ref >60)
Glucose, Bld: 103 mg/dL — ABNORMAL HIGH (ref 65–99)
Potassium: 3.7 mmol/L (ref 3.5–5.1)
SODIUM: 138 mmol/L (ref 135–145)
Total Bilirubin: 0.4 mg/dL (ref 0.3–1.2)
Total Protein: 4 g/dL — ABNORMAL LOW (ref 6.5–8.1)

## 2017-09-01 LAB — POCT I-STAT 3, ART BLOOD GAS (G3+)
Acid-base deficit: 11 mmol/L — ABNORMAL HIGH (ref 0.0–2.0)
Acid-base deficit: 11 mmol/L — ABNORMAL HIGH (ref 0.0–2.0)
Acid-base deficit: 9 mmol/L — ABNORMAL HIGH (ref 0.0–2.0)
BICARBONATE: 15.5 mmol/L — AB (ref 20.0–28.0)
BICARBONATE: 16.5 mmol/L — AB (ref 20.0–28.0)
BICARBONATE: 17.2 mmol/L — AB (ref 20.0–28.0)
O2 SAT: 60 %
O2 SAT: 89 %
O2 Saturation: 83 %
PCO2 ART: 36.7 mmHg (ref 32.0–48.0)
PH ART: 7.128 — AB (ref 7.350–7.450)
TCO2: 16 mmol/L — AB (ref 22–32)
TCO2: 17 mmol/L — AB (ref 22–32)
TCO2: 19 mmol/L — AB (ref 22–32)
pCO2 arterial: 52.7 mmHg — ABNORMAL HIGH (ref 32.0–48.0)
pCO2 arterial: 36.5 mmHg (ref 32.0–48.0)
pH, Arterial: 7.242 — ABNORMAL LOW (ref 7.350–7.450)
pH, Arterial: 7.268 — ABNORMAL LOW (ref 7.350–7.450)
pO2, Arterial: 66 mmHg — ABNORMAL LOW (ref 83.0–108.0)
pO2, Arterial: 69 mmHg — ABNORMAL LOW (ref 83.0–108.0)
pO2, Arterial: 39 mmHg — CL (ref 83.0–108.0)

## 2017-09-01 LAB — BLOOD GAS, ARTERIAL
ACID-BASE DEFICIT: 6.5 mmol/L — AB (ref 0.0–2.0)
BICARBONATE: 20.1 mmol/L (ref 20.0–28.0)
Drawn by: 41308
FIO2: 40
LHR: 16 {breaths}/min
MECHVT: 400 mL
O2 Saturation: 93.8 %
PATIENT TEMPERATURE: 98.6
PEEP/CPAP: 5 cmH2O
PO2 ART: 76.9 mmHg — AB (ref 83.0–108.0)
pCO2 arterial: 51.8 mmHg — ABNORMAL HIGH (ref 32.0–48.0)
pH, Arterial: 7.213 — ABNORMAL LOW (ref 7.350–7.450)

## 2017-09-01 LAB — URINE CULTURE: CULTURE: NO GROWTH

## 2017-09-01 LAB — HEMOGLOBIN AND HEMATOCRIT, BLOOD
HEMATOCRIT: 30.5 % — AB (ref 36.0–46.0)
Hemoglobin: 9.6 g/dL — ABNORMAL LOW (ref 12.0–15.0)

## 2017-09-01 LAB — TSH: TSH: 1.671 u[IU]/mL (ref 0.350–4.500)

## 2017-09-01 LAB — MAGNESIUM: MAGNESIUM: 1.9 mg/dL (ref 1.7–2.4)

## 2017-09-01 LAB — CORTISOL: Cortisol, Plasma: 28.5 ug/dL

## 2017-09-01 LAB — OSMOLALITY: Osmolality: 298 mOsm/kg — ABNORMAL HIGH (ref 275–295)

## 2017-09-01 LAB — OSMOLALITY, URINE: Osmolality, Ur: 446 mOsm/kg (ref 300–900)

## 2017-09-01 MED ORDER — POTASSIUM CHLORIDE 10 MEQ/100ML IV SOLN
10.0000 meq | INTRAVENOUS | Status: AC
  Administered 2017-09-01 – 2017-09-02 (×4): 10 meq via INTRAVENOUS
  Filled 2017-09-01 (×4): qty 100

## 2017-09-01 MED ORDER — SODIUM CHLORIDE 0.9 % IV BOLUS (SEPSIS)
500.0000 mL | Freq: Once | INTRAVENOUS | Status: AC
  Administered 2017-09-01: 500 mL via INTRAVENOUS

## 2017-09-01 MED ORDER — STERILE WATER FOR INJECTION IV SOLN
INTRAVENOUS | Status: DC
  Administered 2017-09-01 – 2017-09-02 (×2): via INTRAVENOUS
  Filled 2017-09-01 (×4): qty 850

## 2017-09-01 MED ORDER — IPRATROPIUM-ALBUTEROL 0.5-2.5 (3) MG/3ML IN SOLN
3.0000 mL | Freq: Four times a day (QID) | RESPIRATORY_TRACT | Status: DC
  Administered 2017-09-01 – 2017-09-06 (×21): 3 mL via RESPIRATORY_TRACT
  Filled 2017-09-01 (×22): qty 3

## 2017-09-01 MED ORDER — SODIUM CHLORIDE 0.9 % IV SOLN
0.0000 ug/min | INTRAVENOUS | Status: DC
  Administered 2017-09-01: 140 ug/min via INTRAVENOUS
  Administered 2017-09-01: 55 ug/min via INTRAVENOUS
  Administered 2017-09-01: 70 ug/min via INTRAVENOUS
  Administered 2017-09-01: 120 ug/min via INTRAVENOUS
  Administered 2017-09-01: 180 ug/min via INTRAVENOUS
  Administered 2017-09-01 (×2): 55 ug/min via INTRAVENOUS
  Administered 2017-09-02 (×2): 30 ug/min via INTRAVENOUS
  Administered 2017-09-03: 20 ug/min via INTRAVENOUS
  Administered 2017-09-04: 10 ug/min via INTRAVENOUS
  Filled 2017-09-01 (×15): qty 1

## 2017-09-01 MED ORDER — SODIUM CHLORIDE 0.9 % IV SOLN: INTRAVENOUS | Status: DC

## 2017-09-01 MED ORDER — ACETAMINOPHEN 650 MG RE SUPP
650.0000 mg | Freq: Four times a day (QID) | RECTAL | Status: DC | PRN
  Administered 2017-09-01 – 2017-09-02 (×2): 650 mg via RECTAL
  Filled 2017-09-01 (×2): qty 1

## 2017-09-01 MED ORDER — MAGNESIUM SULFATE IN D5W 1-5 GM/100ML-% IV SOLN
1.0000 g | Freq: Once | INTRAVENOUS | Status: AC
  Administered 2017-09-01: 1 g via INTRAVENOUS
  Filled 2017-09-01: qty 100

## 2017-09-01 MED ORDER — LEVOTHYROXINE SODIUM 100 MCG IV SOLR
12.5000 ug | Freq: Every day | INTRAVENOUS | Status: DC
Start: 2017-09-01 — End: 2017-09-02
  Administered 2017-09-01: 12.5 ug via INTRAVENOUS
  Filled 2017-09-01 (×2): qty 5

## 2017-09-01 NOTE — Progress Notes (Signed)
[facility] Progress Note  Rachel Dodson 71 y.o. 02-17-1946  CC:  Upper GI bleed   Subjective: No further bleeding episodes. Patient remains intubated. agitated.  ROS : Not able to obtain   Objective: Vital signs in last 24 hours: Vitals:   09/01/17 0830 09/01/17 0845  BP: (!) 88/52 (!) 104/53  Pulse: 99 94  Resp: 16 16  Temp: 100 F (37.8 C) 99.9 F (37.7 C)  SpO2: 96% 97%    Physical Exam:  General:    Remains intubated. Agitated Lungs:  Coarse breath sounds bilaterally Heart: Tachycardia no murmurs, clicks, rubs,  or gallops. Abdomen: Soft, non-tender, nondistended, bowel sounds present. No peritoneal signs    Lab Results:  Recent Labs  09/13/2017 0620 09/01/17 0310  NA 131* 138  K 4.7 3.7  CL 96* 111  CO2 25 20*  GLUCOSE 142* 103*  BUN 69* 46*  CREATININE 1.62* 1.59*  CALCIUM 8.7* 7.4*    Recent Labs  09/18/2017 0620 09/01/17 0310  AST 23 14*  ALT 12* 9*  ALKPHOS 46 34*  BILITOT 0.4 0.4  PROT 5.6* 4.0*  ALBUMIN 3.4* 2.1*    Recent Labs  09/07/2017 1615 09/01/17 0310  WBC 32.5* 33.0*  NEUTROABS 29.6* 30.4*  HGB 11.2* 10.6*  HCT 34.0* 31.2*  MCV 89.7 90.7  PLT 235 261    Recent Labs  09/10/2017 0620  LABPROT 21.7*  INR 1.91      Assessment/Plan: - Coffee-ground emesis and melena. EGD yesterday showed large hiatal hernia with LA Grade D ulcerative and erosive esophagitis. - Acute hypoxic respiratory failure. Currently remains intubated. - Acute blood loss anemia. Hemoglobin stable.  Recommendations ------------------------- - Continue Protonix drip for today. Okay to extubate from GI standpoint. - Switch to IV twice a day PPI tomorrow if hemoglobin remains stable. - Recommend oral twice a day PPI on discharge at least for 8 weeks followed by once a day PPI - Recommend repeat EGD in 2-3 months to document healing of esophagitis. - Follow-up with primary GI Dr. Gala Romney in 4-6 weeks after discharge. - Avoid NSAIDs. - All   instructions were discussed with patient's cousin - GI will sign off. Call us back if needed  Otis Brace MD, FACP 09/01/2017, 10:33 AM  Pager [phone]  If no answer or after 5 PM call [phone]

## 2017-09-01 NOTE — Progress Notes (Signed)
[facility] Progress Note Patient Name: Rachel Dodson DOB: 11/24/1946 MRN: [id number]   Date of Service  09/01/2017  HPI/Events of Note  ABG on 40%/PRVC 16/TV 400/P 5 = 7.12/52.7/66.0/19  eICU Interventions  Will order: 1. Increase PRVC rate to 34. 2. ABG at 6 PM.      Intervention Category Major Interventions: Acid-Base disturbance - evaluation and management;Respiratory failure - evaluation and management  Lysle Dingwall 09/01/2017, 4:51 PM

## 2017-09-01 NOTE — Progress Notes (Signed)
PULMONARY / CRITICAL CARE MEDICINE   Name: EDANA AGUADO MRN: [id number] DOB: 05-06-1946    ADMISSION DATE:  09/16/2017  REFERRING MD:  [facility]  CHIEF COMPLAINT:  Black emesis.  HISTORY OF PRESENT ILLNESS:   71 yo female smoker from SNF to [facility] ER with hematemesis and melena.  Intubated for airway protection and transfer to [facility].  Had recent admit for E coli UTI.  PMHx of hyponatremia, HH, depression, hypothyroidism, hypertension, COPD.  SUBJECTIVE:  Low Vt and RR on SBT.  No further bleeding.  VITAL SIGNS: BP (!) 104/53   Pulse 94   Temp 99.9 F (37.7 C)   Resp 16   Ht 5\' 1"  (1.549 m)   Wt 134 lb 14.7 oz (61.2 kg)   SpO2 97%   BMI 25.49 kg/m   VENTILATOR SETTINGS: Vent Mode: PRVC FiO2 (%):  [36 %-40 %] 40 % Set Rate:  [16 bmp] 16 bmp Vt Set:  [400 mL-500 mL] 400 mL PEEP:  [5 cmH20] 5 cmH20 Plateau Pressure:  [15 cmH20-24 cmH20] 15 cmH20  INTAKE / OUTPUT: I/O last 3 completed shifts: In: 7541.3 [I.V.:3499.7; Blood:269.2; Other:630; NG/GT:30; IV Piggyback:3112.5] Out: 6644 [Urine:1180; Emesis/NG output:1450; Other:900]  PHYSICAL EXAMINATION:  General - sedated Eyes - pupils reactive ENT - ETT in place Cardiac - regular, no murmur Chest - b/l rhonchi, decreased BS at bases, barrel chest Abd - soft, non tender Ext - no edema Skin - no rashes Neuro - follows commands  LABS:  BMET  Recent Labs Lab 08/29/17 0608 09/05/2017 0610 09/19/2017 0620 09/01/17 0310  NA 127* 131* 131* 138  K 3.9 4.7 4.7 3.7  CL 89* 94* 96* 111  CO2 28  --  25 20*  BUN 37* 78* 69* 46*  CREATININE 0.98 1.60* 1.62* 1.59*  GLUCOSE 135* 135* 142* 103*    Electrolytes  Recent Labs Lab 08/29/17 0608 09/19/2017 0620 09/01/17 0310  CALCIUM 9.5 8.7* 7.4*    CBC  Recent Labs Lab 09/20/2017 0620 08/29/2017 1615 09/01/17 0310  WBC 31.7* 32.5* 33.0*  HGB 9.1* 11.2* 10.6*  HCT 27.2* 34.0* 31.2*  PLT 355 235 261    Coag's  Recent Labs Lab 09/02/2017 0620  APTT 32  INR 1.91     Sepsis Markers  Recent Labs Lab 08/28/17 2015 09/12/2017 0611 09/16/2017 1807  LATICACIDVEN 1.1 3.45*  --   PROCALCITON  --   --  1.04    ABG  Recent Labs Lab 09/07/2017 0623 09/13/2017 1622 09/01/17 0410  PHART 7.341* 7.324* 7.213*  PCO2ART 37.6 35.5 51.8*  PO2ART 98.7 96.0 76.9*    Liver Enzymes  Recent Labs Lab 08/28/17 2015 09/05/2017 0620 09/01/17 0310  AST 16 23 14*  ALT 13* 12* 9*  ALKPHOS 68 46 34*  BILITOT 0.5 0.4 0.4  ALBUMIN 3.9 3.4* 2.1*    Cardiac Enzymes  Recent Labs Lab 08/24/2017 0622  TROPONINI <0.03    Glucose  Recent Labs Lab 09/09/2017 1442  GLUCAP 120*    Imaging Dg Chest Port 1 View  Result Date: 08/24/2017 CLINICAL DATA:  Patient status post intubation. EXAM: PORTABLE CHEST 1 VIEW COMPARISON:  Chest radiograph 09/03/2017. FINDINGS: ET tube terminates within the distal trachea, near the carina, recommend retraction. Enteric tube courses inferior to the diaphragm. Stable cardiomegaly. Aortic atherosclerosis. Large hiatal hernia. Bibasilar heterogeneous opacities. IMPRESSION: ET tube terminates in the distal trachea, near the carina, recommend retraction, approximately 2 cm. Cardiomegaly. Hiatal hernia. Basilar atelectasis. These results will be called to the  ordering clinician or representative by the Radiologist Assistant, and communication documented in the PACS or zVision Dashboard. Electronically Signed   By: Lovey Newcomer M.D.   On: 09/04/2017 17:55   Dg Chest Port 1 View  Result Date: 08/25/2017 CLINICAL DATA:  Patient status post intubation. EXAM: PORTABLE CHEST 1 VIEW COMPARISON:  Chest radiograph 09/21/2017 FINDINGS: Patient is rotated to the left, limiting evaluation. Enteric tube courses inferior to the diaphragm. ET tube projects at the distal trachea, exact location is difficult to discern given patient rotation. Stable cardiomegaly. Low lung volumes. Large hiatal hernia. Probable bibasilar atelectasis. No definite pneumothorax.  IMPRESSION: Marked leftward patient rotation limits evaluation for exact location of the ET tube. The distal tip of the tube appears to be at the distal trachea/carina. Recommend repeat evaluation with AP positioning as well as possible mild retraction of ET tube prior to reassessment. Discussed with Nurse Etheleen Mayhew, RN at 420 p.m. on 09/09/2017. Cardiomegaly. Probable basilar atelectasis. Electronically Signed   By: Lovey Newcomer M.D.   On: 08/24/2017 16:25     STUDIES:  EGD 9/08 >> Grade D erosive esophagitis, large HH  CULTURES: Sputum 9/08 >> Urine 9/08 >> negative  ANTIBIOTICS: Zosyn 9/08 >>   SIGNIFICANT EVENTS: 9/08 Transfer from [facility], GI consulted  LINES/TUBES: ETT 9/08 >>  ASSESSMENT / PLAN:  Upper GI bleeding 2nd to erosive esophagitis. Large hiatal hernia. - protonix  Acute hypoxic respiratory failure with compromised airway. Tobacco abuse with COPD. - pressure support wean - scheduled BDs  Hx of HTN. - hold norvasc, hydralazine, cozaar for now  Acute blood loss anemia. - f/u CBC  CKD 2. - monitor renal fx  Hx of hypothyroidism. - synthroid  Hx of depression. - hold elavil, klonopin, prozac for now  DVT prophylaxis - SCDs SUP - protonix Nutrition - NPO Goals of care - full code  Updated family at bedside  CC time 31 minutes  Chesley Mires, MD [place] 09/01/2017, 10:09 AM Pager:  [phone] After 3pm call: [phone]

## 2017-09-01 NOTE — Progress Notes (Signed)
[facility] Progress Note Patient Name: Rachel Dodson DOB: Jan 07, 1946 MRN: [id number]   Date of Service  09/01/2017  HPI/Events of Note  Multiple issues: 1. Increasing Phenylephrine IV infusion requirements and 2. Sinus tachycardia with PACs, PVCs and fusion beats.   eICU Interventions  Will order: 1. BMP, Mg++ level and ABG STAT. 2. Bolus with 0.9 NaCl 500 mL IV over 30 minutes now.      Intervention Category Major Interventions: Hypotension - evaluation and management;Arrhythmia - evaluation and management  Sommer,Steven Eugene 09/01/2017, 3:27 PM

## 2017-09-01 NOTE — Progress Notes (Signed)
Dear Doctor: Rachel Dodson This patient has been identified as a candidate for PICC for the following reason (s): IV therapy over 48 hours, poor veins/poor circulatory system (CHF, COPD, emphysema, diabetes, steroid use, IV drug abuse, etc.) and incompatible drugs (aminophyllin, TPN, heparin, given with an antibiotic) If you agree, please write an order for the indicated device. For any questions contact the Vascular Access Team at [phone] if no answer, please leave a message.  Thank you for supporting the early vascular access assessment program.

## 2017-09-01 NOTE — Progress Notes (Signed)
[facility] Progress Note Patient Name: Rachel Dodson DOB: [birth year] MRN: [id number]   Date of Service  09/01/2017  HPI/Events of Note  Multiple issues: 1. ABG on 40%/PRVC 34/TV 400/P 5 = 7.24/36.5/69.0/15.5, 2. K+ = 3.3, Mg+ = 1.9 and Creatinine = 1.40 and 3. Temp = 101.1 F - patient is already on Zosyn and AST/ALT are both normal.   eICU Interventions  Will order: 1. NaHCO3 IV infusion at 50 mL/hour.  2. Repeat ABG at 12 midnight. 3. Replace Mg++ and K+. 4. Tylenol Suppository 650 mg PR Q 6 hours PRN Temp > 101.0 F.     Intervention Category Major Interventions: Electrolyte abnormality - evaluation and management;Acid-Base disturbance - evaluation and management;Respiratory failure - evaluation and management;Infection - evaluation and management  Nunzio Banet [place] 09/01/2017, 6:54 PM

## 2017-09-01 NOTE — Progress Notes (Signed)
ABG critical vales given to D. Radford Pax, RN.

## 2017-09-02 ENCOUNTER — Inpatient Hospital Stay (HOSPITAL_COMMUNITY): Payer: Medicare Other

## 2017-09-02 DIAGNOSIS — R578 Other shock: Secondary | ICD-10-CM

## 2017-09-02 DIAGNOSIS — Z452 Encounter for adjustment and management of vascular access device: Secondary | ICD-10-CM

## 2017-09-02 LAB — BASIC METABOLIC PANEL
Anion gap: 5 (ref 5–15)
Anion gap: 8 (ref 5–15)
BUN: 25 mg/dL — ABNORMAL HIGH (ref 6–20)
BUN: 24 mg/dL — ABNORMAL HIGH (ref 6–20)
CALCIUM: 7.6 mg/dL — AB (ref 8.9–10.3)
CHLORIDE: 118 mmol/L — AB (ref 101–111)
CO2: 18 mmol/L — ABNORMAL LOW (ref 22–32)
CO2: 19 mmol/L — AB (ref 22–32)
CREATININE: 1.33 mg/dL — AB (ref 0.44–1.00)
CREATININE: 1.26 mg/dL — AB (ref 0.44–1.00)
Calcium: 7.6 mg/dL — ABNORMAL LOW (ref 8.9–10.3)
Chloride: 121 mmol/L — ABNORMAL HIGH (ref 101–111)
GFR calc Af Amer: 48 mL/min — ABNORMAL LOW (ref >60)
GFR calc non Af Amer: 39 mL/min — ABNORMAL LOW (ref >60)
GFR calc non Af Amer: 42 mL/min — ABNORMAL LOW (ref >60)
GFR, EST AFRICAN AMERICAN: 45 mL/min — AB (ref >60)
Glucose, Bld: 86 mg/dL (ref 65–99)
Glucose, Bld: 169 mg/dL — ABNORMAL HIGH (ref 65–99)
Potassium: 3.5 mmol/L (ref 3.5–5.1)
Potassium: 2.9 mmol/L — ABNORMAL LOW (ref 3.5–5.1)
SODIUM: 144 mmol/L (ref 135–145)
Sodium: 145 mmol/L (ref 135–145)

## 2017-09-02 LAB — CBC
HCT: 27.6 % — ABNORMAL LOW (ref 36.0–46.0)
HEMOGLOBIN: 8.8 g/dL — AB (ref 12.0–15.0)
MCH: 29.3 pg (ref 26.0–34.0)
MCHC: 31.9 g/dL (ref 30.0–36.0)
MCV: 92 fL (ref 78.0–100.0)
PLATELETS: 219 10*3/uL (ref 150–400)
RBC: 3 MIL/uL — ABNORMAL LOW (ref 3.87–5.11)
RDW: 17.4 % — AB (ref 11.5–15.5)
WBC: 21.7 10*3/uL — ABNORMAL HIGH (ref 4.0–10.5)

## 2017-09-02 LAB — POCT I-STAT 3, ART BLOOD GAS (G3+)
ACID-BASE EXCESS: 3 mmol/L — AB (ref 0.0–2.0)
BICARBONATE: 25 mmol/L (ref 20.0–28.0)
O2 SAT: 99 %
Patient temperature: 99.9
TCO2: 26 mmol/L (ref 22–32)
pCO2 arterial: 29.5 mmHg — ABNORMAL LOW (ref 32.0–48.0)
pH, Arterial: 7.538 — ABNORMAL HIGH (ref 7.350–7.450)
pO2, Arterial: 121 mmHg — ABNORMAL HIGH (ref 83.0–108.0)

## 2017-09-02 LAB — BLOOD GAS, ARTERIAL
ACID-BASE DEFICIT: 6.1 mmol/L — AB (ref 0.0–2.0)
BICARBONATE: 17.5 mmol/L — AB (ref 20.0–28.0)
Drawn by: 39899
FIO2: 50
MECHVT: 400 mL
O2 SAT: 98.2 %
PATIENT TEMPERATURE: 98.6
PCO2 ART: 27.7 mmHg — AB (ref 32.0–48.0)
PEEP/CPAP: 5 cmH2O
PH ART: 7.418 (ref 7.350–7.450)
PO2 ART: 103 mmHg (ref 83.0–108.0)
RATE: 34 resp/min

## 2017-09-02 LAB — CULTURE, BLOOD (ROUTINE X 2)
CULTURE: NO GROWTH
CULTURE: NO GROWTH
Special Requests: ADEQUATE
Special Requests: ADEQUATE

## 2017-09-02 LAB — PHOSPHORUS: Phosphorus: 1.6 mg/dL — ABNORMAL LOW (ref 2.5–4.6)

## 2017-09-02 LAB — MAGNESIUM: Magnesium: 2 mg/dL (ref 1.7–2.4)

## 2017-09-02 LAB — GLUCOSE, CAPILLARY
GLUCOSE-CAPILLARY: 117 mg/dL — AB (ref 65–99)
GLUCOSE-CAPILLARY: 231 mg/dL — AB (ref 65–99)
Glucose-Capillary: 144 mg/dL — ABNORMAL HIGH (ref 65–99)
Glucose-Capillary: 174 mg/dL — ABNORMAL HIGH (ref 65–99)

## 2017-09-02 MED ORDER — ACETAMINOPHEN 160 MG/5ML PO SOLN
650.0000 mg | Freq: Four times a day (QID) | ORAL | Status: DC | PRN
  Administered 2017-09-02 – 2017-09-04 (×4): 650 mg
  Filled 2017-09-02 (×4): qty 20.3

## 2017-09-02 MED ORDER — POTASSIUM PHOSPHATES 15 MMOLE/5ML IV SOLN
30.0000 mmol | Freq: Once | INTRAVENOUS | Status: AC
  Administered 2017-09-02: 30 mmol via INTRAVENOUS
  Filled 2017-09-02: qty 10

## 2017-09-02 MED ORDER — FENTANYL CITRATE (PF) 100 MCG/2ML IJ SOLN
50.0000 ug | INTRAMUSCULAR | Status: DC | PRN
  Administered 2017-09-03: 50 ug via INTRAVENOUS
  Filled 2017-09-02: qty 2

## 2017-09-02 MED ORDER — FLUOXETINE HCL 20 MG/5ML PO SOLN
40.0000 mg | Freq: Every day | ORAL | Status: DC
  Administered 2017-09-02 – 2017-09-05 (×4): 40 mg
  Filled 2017-09-02 (×6): qty 10

## 2017-09-02 MED ORDER — POTASSIUM CHLORIDE 20 MEQ/15ML (10%) PO SOLN
40.0000 meq | Freq: Once | ORAL | Status: AC
  Administered 2017-09-02: 40 meq via ORAL
  Filled 2017-09-02: qty 30

## 2017-09-02 MED ORDER — PRO-STAT SUGAR FREE PO LIQD
30.0000 mL | Freq: Two times a day (BID) | ORAL | Status: DC
  Administered 2017-09-02: 30 mL
  Filled 2017-09-02: qty 30

## 2017-09-02 MED ORDER — DEXMEDETOMIDINE HCL IN NACL 400 MCG/100ML IV SOLN
0.0000 ug/kg/h | INTRAVENOUS | Status: DC
  Administered 2017-09-02: 1.2 ug/kg/h via INTRAVENOUS
  Administered 2017-09-02: 1.8 ug/kg/h via INTRAVENOUS
  Administered 2017-09-02: 1.2 ug/kg/h via INTRAVENOUS
  Administered 2017-09-02 – 2017-09-04 (×9): 1.8 ug/kg/h via INTRAVENOUS
  Administered 2017-09-04 (×2): 0.8 ug/kg/h via INTRAVENOUS
  Administered 2017-09-04: 1.8 ug/kg/h via INTRAVENOUS
  Administered 2017-09-05: 0.8 ug/kg/h via INTRAVENOUS
  Filled 2017-09-02 (×16): qty 100

## 2017-09-02 MED ORDER — LEVOTHYROXINE SODIUM 25 MCG PO TABS
25.0000 ug | ORAL_TABLET | Freq: Every day | ORAL | Status: DC
  Administered 2017-09-02 – 2017-09-05 (×4): 25 ug
  Filled 2017-09-02 (×6): qty 1

## 2017-09-02 MED ORDER — INSULIN ASPART 100 UNIT/ML ~~LOC~~ SOLN
0.0000 [IU] | SUBCUTANEOUS | Status: DC
  Administered 2017-09-02: 5 [IU] via SUBCUTANEOUS
  Administered 2017-09-03: 2 [IU] via SUBCUTANEOUS
  Administered 2017-09-03 (×3): 3 [IU] via SUBCUTANEOUS
  Administered 2017-09-03 – 2017-09-04 (×3): 2 [IU] via SUBCUTANEOUS
  Administered 2017-09-04 – 2017-09-05 (×3): 3 [IU] via SUBCUTANEOUS

## 2017-09-02 MED ORDER — PANTOPRAZOLE SODIUM 40 MG IV SOLR
40.0000 mg | Freq: Two times a day (BID) | INTRAVENOUS | Status: DC
  Administered 2017-09-02 – 2017-09-06 (×9): 40 mg via INTRAVENOUS
  Filled 2017-09-02 (×11): qty 40

## 2017-09-02 MED ORDER — VITAL HIGH PROTEIN PO LIQD: 1000.0000 mL | ORAL | Status: DC

## 2017-09-02 MED ORDER — FENTANYL CITRATE (PF) 100 MCG/2ML IJ SOLN
50.0000 ug | INTRAMUSCULAR | Status: AC | PRN
  Administered 2017-09-02 (×3): 50 ug via INTRAVENOUS

## 2017-09-02 MED ORDER — FUROSEMIDE 10 MG/ML IJ SOLN
40.0000 mg | Freq: Once | INTRAMUSCULAR | Status: AC
  Administered 2017-09-02: 40 mg via INTRAVENOUS
  Filled 2017-09-02: qty 4

## 2017-09-02 MED ORDER — AMITRIPTYLINE HCL 100 MG PO TABS
100.0000 mg | ORAL_TABLET | Freq: Every day | ORAL | Status: DC
  Administered 2017-09-02 – 2017-09-04 (×3): 100 mg
  Filled 2017-09-02 (×4): qty 1

## 2017-09-02 MED ORDER — CLONAZEPAM 1 MG PO TABS
1.0000 mg | ORAL_TABLET | Freq: Two times a day (BID) | ORAL | Status: DC
  Administered 2017-09-02 – 2017-09-05 (×7): 1 mg
  Filled 2017-09-02 (×7): qty 1

## 2017-09-02 MED ORDER — SODIUM CHLORIDE 0.9 % IV SOLN
0.0000 ug/kg/h | INTRAVENOUS | Status: DC
  Administered 2017-09-02: 0.6 ug/kg/h via INTRAVENOUS
  Filled 2017-09-02: qty 2

## 2017-09-02 MED ORDER — VITAL AF 1.2 CAL PO LIQD
1000.0000 mL | ORAL | Status: DC
  Administered 2017-09-02 – 2017-09-05 (×3): 1000 mL
  Filled 2017-09-02 (×4): qty 1000

## 2017-09-02 MED ORDER — POTASSIUM CHLORIDE 10 MEQ/50ML IV SOLN
10.0000 meq | INTRAVENOUS | Status: AC
  Administered 2017-09-02 (×2): 10 meq via INTRAVENOUS
  Filled 2017-09-02 (×2): qty 50

## 2017-09-02 MED ORDER — CLONAZEPAM 0.1 MG/ML ORAL SUSPENSION
1.0000 mg | Freq: Two times a day (BID) | ORAL | Status: DC
Start: 2017-09-02 — End: 2017-09-02
  Filled 2017-09-02: qty 10

## 2017-09-02 MED ORDER — POTASSIUM CHLORIDE 10 MEQ/100ML IV SOLN: 10.0000 meq | INTRAVENOUS | Status: DC

## 2017-09-02 MED FILL — Medication: Qty: 1 | Status: AC

## 2017-09-02 NOTE — Progress Notes (Signed)
Nursing note: Notified by radiology that patient has left preferential lung intubation and recommended to withdraw 3cm.  RT at bedside advised of notification from radiology.  Reviewed x-ray and noted that ETTube is at 24cm and should be at 25cm.  Withdrawn 1cm and awaiting CCM MD.  Increased oxygen to 50% via ventilator as patient remains at 90% with SaO2.

## 2017-09-02 NOTE — Progress Notes (Signed)
Initial Nutrition Assessment  DOCUMENTATION CODES:   Not applicable  INTERVENTION:   Tube Feeding:   Vital 1.2 AF @ goal of 60 ml/hr providing 108 g of protein, 1728 kcals and 1166 mL of free water. PEPuP appropriate  NUTRITION DIAGNOSIS:   Inadequate oral intake related to acute illness as evidenced by NPO status.  GOAL:   Provide needs based on ASPEN/SCCM guidelines  MONITOR:   TF tolerance, Vent status, Labs, Weight trends  REASON FOR ASSESSMENT:   Consult Enteral/tube feeding initiation and management  ASSESSMENT:    71 yo female admitted with upper GI bleed secondary to erosive esophagitis with associated acute blood loss anemia, acute hypoxic respiratory failure requiring intubation (suspected COPD exacerbation, possible aspiration). Pt with hx of anxiety/depression, chronic hyponatremia, HTN, recurrent falls   Patient is currently intubated on ventilator support, remains on pressors, febrile MV: 13.5 L/min Temp (24hrs), Avg:101.2 F (38.4 C), Min:99.9 F (37.7 C), Max:102.2 F (39 C)  9/8 EGD-large hiatal hernia with LA Grade D ulcerative and erosive esophagitis  OG tube in place  Unable to complete Nutrition-Focused physical exam at this time.   Labs: Creatinine 1.33 (improving),  Meds: sodium bicarb at 50 ml/hr  Diet Order:  Diet NPO time specified  Skin:  Reviewed, no issues  Last BM:  9/8  Height:   Ht Readings from Last 1 Encounters:  09/08/2017 5\' 1"  (1.549 m)    Weight:   Wt Readings from Last 1 Encounters:  09/02/17 145 lb 15.1 oz (66.2 kg)    Ideal Body Weight:  47.7 kg  BMI:  Body mass index is 27.58 kg/m.  Estimated Nutritional Needs:   Kcal:  5277 kcals  Protein:  99-132 g  Fluid:  >/= 1.8 L  EDUCATION NEEDS:   No education needs identified at this time  [place], [place], LDN [phone] Pager  [phone] Weekend/On-Call Pager

## 2017-09-02 NOTE — Progress Notes (Signed)
[facility] Progress Note Patient Name: Rachel Dodson DOB: Sep 12, 1946 MRN: [id number]   Date of Service  09/02/2017  HPI/Events of Note    eICU Interventions  Hypokalemia/hypophos -repleted      Intervention Category Intermediate Interventions: Electrolyte abnormality - evaluation and management  Divine Hansley V. 09/02/2017, 6:57 PM

## 2017-09-02 NOTE — Progress Notes (Signed)
PULMONARY / CRITICAL CARE MEDICINE   Name: Rachel Dodson MRN: [id number] DOB: [birth year]    ADMISSION DATE:  08/27/2017  REFERRING MD:  [facility]  CHIEF COMPLAINT:  Black emesis.  HISTORY OF PRESENT ILLNESS:   71 yo female smoker from SNF to [facility] ER with hematemesis and melena.  Intubated for airway protection and transfer to [facility].  Had recent admit for E coli UTI.  PMHx of hyponatremia, HH, depression, hypothyroidism, hypertension, COPD.  SUBJECTIVE:  Tachycardic overnight; seems anxious still  Still on pressors   VITAL SIGNS: BP 118/68 (BP Location: Left Arm)   Pulse (!) 126   Temp (!) 101.3 F (38.5 C) (Core (Comment))   Resp (!) 21   Ht '5\' 1"'$  (1.549 m)   Wt 145 lb 15.1 oz (66.2 kg)   SpO2 90%   BMI 27.58 kg/m   VENTILATOR SETTINGS: Vent Mode: PRVC FiO2 (%):  [40 %-50 %] 50 % Set Rate:  [16 bmp-34 bmp] 34 bmp Vt Set:  [400 mL] 400 mL PEEP:  [5 cmH20] 5 cmH20 Pressure Support:  [5 cmH20] 5 cmH20 Plateau Pressure:  [19 cmH20-29 cmH20] 19 cmH20  INTAKE / OUTPUT: I/O last 3 completed shifts: In: 9808.9 [I.V.:7861.4; NG/GT:210; IV Piggyback:1737.5] Out: 3725 [Urine:2525; Emesis/NG output:1200]  PHYSICAL EXAMINATION: General appearance:  71 Year old female, chronically ill appearing.  Eyes: anicteric sclerae, moist conjunctivae; PERRL, EOMI bilaterally. Mouth:  membranes and no mucosal ulcerations; she is orally intubated Neck: Trachea midline; neck supple, no JVD; has a left CVL dressing that is CD&I Lungs/chest: crackles L>R, some scattered rhonchi. Decreased on the right , CV: tachy irreg; afib w/ RVR on tele  Abdomen: Soft, non-tender; no masses  Extremities: No peripheral edema or extremity lymphadenopathy Skin: Normal temperature, turgor and texture; no rash, ulcers or subcutaneous nodules Psych:awake, tracks, follows some commands LABS:  BMET  Recent Labs Lab 09/01/17 0310 09/01/17 1611 09/02/17 0303  NA 138 141 144  K 3.7 3.3* 3.5  CL 111 120*  121*  CO2 20* 17* 18*  BUN 46* 32* 25*  CREATININE 1.59* 1.40* 1.33*  GLUCOSE 103* 112* 86    Electrolytes  Recent Labs Lab 09/01/17 0310 09/01/17 1611 09/02/17 0303  CALCIUM 7.4* 7.4* 7.6*  MG  --  1.9  --     CBC  Recent Labs Lab 09/21/2017 1615 09/01/17 0310 09/01/17 1611 09/02/17 0303  WBC 32.5* 33.0*  --  21.7*  HGB 11.2* 10.6* 9.6* 8.8*  HCT 34.0* 31.2* 30.5* 27.6*  PLT 235 261  --  219    Coag's  Recent Labs Lab 09/02/2017 0620  APTT 32  INR 1.91    Sepsis Markers  Recent Labs Lab 08/28/17 2015 09/05/2017 0611 09/16/2017 1807  LATICACIDVEN 1.1 3.45*  --   PROCALCITON  --   --  1.04    ABG  Recent Labs Lab 09/01/17 1551 09/01/17 1829 09/01/17 2330  PHART 7.128* 7.242* 7.268*  PCO2ART 52.7* 36.5 36.7  PO2ART 66.0* 69.0* 39.0*    Liver Enzymes  Recent Labs Lab 08/28/17 2015 09/01/2017 0620 09/01/17 0310  AST 16 23 14*  ALT 13* 12* 9*  ALKPHOS 68 46 34*  BILITOT 0.5 0.4 0.4  ALBUMIN 3.9 3.4* 2.1*    Cardiac Enzymes  Recent Labs Lab 09/11/2017 0622  TROPONINI <0.03    Glucose  Recent Labs Lab 09/11/2017 1442  GLUCAP 120*    Imaging Dg Chest Port 1 View  Result Date: 09/02/2017 CLINICAL DATA:  Respiratory failure EXAM:  PORTABLE CHEST 1 VIEW COMPARISON:  09/16/2017 FINDINGS: Preferential left mainstem bronchus intubation. Withdrawal approximately 3 cm is suggested. Increased interstitial markings, favoring mild interstitial edema, with moderate bilateral pleural effusions. Hazy left upper lobe opacity favors asymmetric edema, although infection is not excluded. Cardiomegaly. Enteric tube courses into the stomach. IMPRESSION: Preferential left mainstem bronchus intubation. Withdrawal approximately 3 cm is suggested. Cardiomegaly with mild interstitial edema and moderate bilateral pleural effusions. These results will be called to the ordering clinician or representative by the Radiologist Assistant, and communication documented in the  PACS or zVision Dashboard. Electronically Signed   By: Julian Hy M.D.   On: 09/02/2017 07:12     STUDIES:  EGD 9/08 >> Grade D erosive esophagitis, large HH  CULTURES: Sputum 9/08 >>abundant gpc pairs >>> Urine 9/08 >> negative  ANTIBIOTICS: Zosyn 9/08 >>   SIGNIFICANT EVENTS: 9/08 Transfer from [facility], GI consulted  LINES/TUBES: ETT 9/08 >>  ASSESSMENT / PLAN:  Upper GI bleeding 2nd to erosive esophagitis w/ associated ABL anemia  -Hgb trending down from 9.6 to 8.8 no report of bleeding overnight. Suspect that this may be dilutional to some extent at this point.  Large hiatal hernia. Plan Change PPI to BID (this will be for at least 8 weeks) EGD again in 2-3 mo (f/u w/ GI 4-6wk after dc) No NSAIDS Start tubefeeds.   Acute hypoxic respiratory failure with compromised airway. Tobacco abuse with COPD. Possible aspiration PNA  Left mainstem intubation  PCXR: ETT w/ Left MS intubation. Bibasilar effusion/atx. This has increased from prior film.  Plan Cont full vent support (ve adjusted to a/w metabolic acidosis) Assess CVP Lasix when hemodynamics will support.  Zosyn day #3, f/u cultures   On-going hypotension; suspect that this is drug related.  Hx of HTN. 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  Plan Cont bicarb gtt Serial chem every 8  Repeat abg q 12-->she is at high risk for iatrogenic respiratory alk  Dc NaCl  Hx of hypothyroidism. Plan Synthroid via tube  Hx of depression. Mild acute encephalopathy Plan Resume elavil, klonopin and prozac (all of which she can have w/d from) Cont PAD protocol-->goal RAS -1; add precedex; change fent to PRN   DVT prophylaxis -  SCDs SUP - PPI Nutrition - NPO-->start tubefeeds Goals of care - full code   Summary  Still hypotensive but I suspect that this is drug related. She appears volume overloaded and atelectatic. I think barriers to extubation are as follows: Delirium +/- w/d from home meds Volume excess and atelectasis Hypotension -->which I suspect is iatrogenic For today -CVL-->assess CVP-->likely lasix -start tubefeeds->change PPI to BID -cont bicarb gtt and stop NaCl -add back home meds -start precedex -titrate neo for MAP > 65 Hope we can set stage for extubation in next 24 hrs   My cct 35 min

## 2017-09-02 NOTE — Procedures (Signed)
Central Venous Catheter Insertion Procedure Note Rachel Dodson [id number] [birth year]  Procedure: Insertion of Central Venous Catheter Indications: Assessment of intravascular volume, Drug and/or fluid administration and Frequent blood sampling  Procedure Details Consent: Risks of procedure as well as the alternatives and risks of each were explained to the (patient/caregiver).  Consent for procedure obtained. Time Out: Verified patient identification, verified procedure, site/side was marked, verified correct patient position, special equipment/implants available, medications/allergies/relevent history reviewed, required imaging and test results available.  Performed Real time [place] was used to ID and cannulate vessel.  Maximum sterile technique was used including antiseptics, cap, gloves, gown, hand hygiene, mask and sheet. Skin prep: Chlorhexidine; local anesthetic administered A antimicrobial bonded/coated triple lumen catheter was placed in the right internal jugular vein using the Seldinger technique.  Evaluation Blood flow good Complications: No apparent complications Patient did tolerate procedure well. Chest X-ray ordered to verify placement.  CXR: pending.  Rachel Dodson 09/02/2017, 9:27 AM  Erick Colace ACNP-BC [place] Pager # [phone] OR # [phone] if no answer

## 2017-09-02 NOTE — Procedures (Signed)
Arterial Catheter Insertion Procedure Note KADESHIA KASPARIAN [id number] [birth year]  Procedure: Insertion of Arterial Catheter  Indications: Blood pressure monitoring and Frequent blood sampling  Procedure Details Consent: Unable to obtain consent because of emergent medical necessity. Time Out: Verified patient identification, verified procedure, site/side was marked, verified correct patient position, special equipment/implants available, medications/allergies/relevent history reviewed, required imaging and test results available.  Performed  Maximum sterile technique was used including antiseptics, cap, gloves, gown, hand hygiene, mask and sheet. Skin prep: Chlorhexidine; local anesthetic administered 20 gauge catheter was inserted into right radial artery using the Seldinger technique.  Evaluation Blood flow good; BP tracing good. Complications: No apparent complications.   Bayard Beaver 09/02/2017

## 2017-09-03 ENCOUNTER — Inpatient Hospital Stay (HOSPITAL_COMMUNITY): Payer: Medicare Other

## 2017-09-03 LAB — GLUCOSE, CAPILLARY
GLUCOSE-CAPILLARY: 119 mg/dL — AB (ref 65–99)
GLUCOSE-CAPILLARY: 136 mg/dL — AB (ref 65–99)
GLUCOSE-CAPILLARY: 139 mg/dL — AB (ref 65–99)
GLUCOSE-CAPILLARY: 163 mg/dL — AB (ref 65–99)
Glucose-Capillary: 189 mg/dL — ABNORMAL HIGH (ref 65–99)
Glucose-Capillary: 154 mg/dL — ABNORMAL HIGH (ref 65–99)

## 2017-09-03 LAB — BLOOD GAS, ARTERIAL
ACID-BASE DEFICIT: 1.4 mmol/L (ref 0.0–2.0)
BICARBONATE: 22.6 mmol/L (ref 20.0–28.0)
Drawn by: 51185
FIO2: 40
Mode: POSITIVE
O2 SAT: 93.7 %
PATIENT TEMPERATURE: 100.8
PEEP: 5 cmH2O
PH ART: 7.387 (ref 7.350–7.450)
PO2 ART: 75.7 mmHg — AB (ref 83.0–108.0)
PRESSURE SUPPORT: 10 cmH2O
pCO2 arterial: 39.1 mmHg (ref 32.0–48.0)

## 2017-09-03 LAB — CULTURE, RESPIRATORY W GRAM STAIN: Culture: NORMAL

## 2017-09-03 LAB — BASIC METABOLIC PANEL
Anion gap: 6 (ref 5–15)
BUN: 24 mg/dL — ABNORMAL HIGH (ref 6–20)
CHLORIDE: 114 mmol/L — AB (ref 101–111)
CO2: 22 mmol/L (ref 22–32)
CREATININE: 1.23 mg/dL — AB (ref 0.44–1.00)
Calcium: 7.4 mg/dL — ABNORMAL LOW (ref 8.9–10.3)
GFR calc non Af Amer: 43 mL/min — ABNORMAL LOW (ref >60)
GFR, EST AFRICAN AMERICAN: 50 mL/min — AB (ref >60)
Glucose, Bld: 205 mg/dL — ABNORMAL HIGH (ref 65–99)
POTASSIUM: 3.3 mmol/L — AB (ref 3.5–5.1)
SODIUM: 142 mmol/L (ref 135–145)

## 2017-09-03 LAB — CBC
HCT: 26.2 % — ABNORMAL LOW (ref 36.0–46.0)
HEMOGLOBIN: 8.9 g/dL — AB (ref 12.0–15.0)
MCH: 30.4 pg (ref 26.0–34.0)
MCHC: 34 g/dL (ref 30.0–36.0)
MCV: 89.4 fL (ref 78.0–100.0)
Platelets: 204 10*3/uL (ref 150–400)
RBC: 2.93 MIL/uL — AB (ref 3.87–5.11)
RDW: 17 % — ABNORMAL HIGH (ref 11.5–15.5)
WBC: 12 10*3/uL — AB (ref 4.0–10.5)

## 2017-09-03 LAB — PHOSPHORUS: PHOSPHORUS: 2.9 mg/dL (ref 2.5–4.6)

## 2017-09-03 LAB — MAGNESIUM: MAGNESIUM: 1.9 mg/dL (ref 1.7–2.4)

## 2017-09-03 LAB — TYPE AND SCREEN
ABO/RH(D): A POS
Antibody Screen: NEGATIVE

## 2017-09-03 LAB — CULTURE, RESPIRATORY

## 2017-09-03 MED ORDER — POTASSIUM CHLORIDE 20 MEQ/15ML (10%) PO SOLN
40.0000 meq | Freq: Every day | ORAL | Status: DC
  Administered 2017-09-03: 40 meq
  Filled 2017-09-03: qty 30

## 2017-09-03 MED ORDER — INSULIN ASPART 100 UNIT/ML ~~LOC~~ SOLN
3.0000 [IU] | SUBCUTANEOUS | Status: DC
  Administered 2017-09-03 – 2017-09-05 (×9): 3 [IU] via SUBCUTANEOUS

## 2017-09-03 MED ORDER — SODIUM CHLORIDE 0.9% FLUSH: 10.0000 mL | INTRAVENOUS | Status: DC | PRN

## 2017-09-03 MED ORDER — DEXTROSE 5 % IV SOLN
1.0000 g | INTRAVENOUS | Status: AC
  Administered 2017-09-03 – 2017-09-06 (×4): 1 g via INTRAVENOUS
  Filled 2017-09-03 (×4): qty 10

## 2017-09-03 MED ORDER — POTASSIUM CHLORIDE 20 MEQ/15ML (10%) PO SOLN
40.0000 meq | Freq: Three times a day (TID) | ORAL | Status: AC
  Administered 2017-09-03 – 2017-09-04 (×3): 40 meq
  Filled 2017-09-03 (×3): qty 30

## 2017-09-03 MED ORDER — FREE WATER
200.0000 mL | Freq: Four times a day (QID) | Status: DC
  Administered 2017-09-03 – 2017-09-05 (×6): 200 mL

## 2017-09-03 MED ORDER — POTASSIUM CHLORIDE 20 MEQ/15ML (10%) PO SOLN: 40.0000 meq | Freq: Every day | ORAL | Status: DC

## 2017-09-03 MED ORDER — SODIUM CHLORIDE 0.9% FLUSH
10.0000 mL | Freq: Two times a day (BID) | INTRAVENOUS | Status: DC
  Administered 2017-09-03 – 2017-09-06 (×7): 10 mL

## 2017-09-03 MED ORDER — LACTATED RINGERS IV SOLN
INTRAVENOUS | Status: DC
  Administered 2017-09-03: 11:00:00 via INTRAVENOUS

## 2017-09-03 MED ORDER — FUROSEMIDE 10 MG/ML IJ SOLN
80.0000 mg | Freq: Three times a day (TID) | INTRAMUSCULAR | Status: AC
  Administered 2017-09-03 – 2017-09-04 (×3): 80 mg via INTRAVENOUS
  Filled 2017-09-03 (×3): qty 8

## 2017-09-03 MED ORDER — CHLORHEXIDINE GLUCONATE CLOTH 2 % EX PADS
6.0000 | MEDICATED_PAD | Freq: Every day | CUTANEOUS | Status: DC
  Administered 2017-09-03 – 2017-09-06 (×4): 6 via TOPICAL

## 2017-09-03 NOTE — Progress Notes (Signed)
Wasted approximately 49mls of fentanyl gtt with Shea Evans, RN.

## 2017-09-03 NOTE — Progress Notes (Signed)
This note also relates to the following rows which could not be included: SpO2 - Cannot attach notes to unvalidated device data  Increased FIO2 to maintain sats above 92%. Patient is being diuresed to draw off excess fluid. RT will continue to monitor.

## 2017-09-03 NOTE — Progress Notes (Signed)
PULMONARY / CRITICAL CARE MEDICINE   Name: Rachel Dodson MRN: [id number] DOB: [birth year]    ADMISSION DATE:  09/13/2017  REFERRING MD:  [facility]  CHIEF COMPLAINT:  Black emesis.  HISTORY OF PRESENT ILLNESS:   71 yo female smoker from SNF to [facility] ER with hematemesis and melena.  Intubated for airway protection and transfer to [facility].  Had recent admit for E coli UTI.  PMHx of hyponatremia, HH, depression, hypothyroidism, hypertension, COPD.  SUBJECTIVE:  Failed SBT trial    VITAL SIGNS: BP (!) 117/53   Pulse 95   Temp (!) 100.8 F (38.2 C)   Resp 17   Ht 5\' 1"  (1.549 m)   Wt 146 lb 6.2 oz (66.4 kg)   SpO2 95%   BMI 27.66 kg/m   VENTILATOR SETTINGS: Vent Mode: PSV;CPAP FiO2 (%):  [40 %-50 %] 40 % Set Rate:  [34 bmp] 34 bmp Vt Set:  [400 mL] 400 mL PEEP:  [5 cmH20] 5 cmH20 Pressure Support:  [10 cmH20] 10 cmH20 Plateau Pressure:  [20 cmH20-21 cmH20] 20 cmH20  INTAKE / OUTPUT:  Intake/Output Summary (Last 24 hours) at 09/03/17 0940 Last data filed at 09/03/17 0800  Gross per 24 hour  Intake          3749.73 ml  Output             3610 ml  Net           139.73 ml     PHYSICAL EXAMINATION: General appearance:  71 Year old  Chronically ill appearing female, agitated on vent  Eyes: anicteric sclerae, moist conjunctivae; PERRL, EOMI bilaterally. Mouth:  Moist membranes and no mucosal ulcerations. Orally intubated Neck: Trachea midline; neck supple, no JVD, right IJ unremarkable  Lungs/chest: scattered rhonchi, with normal respiratory effort and no intercostal retractions on PSV of 10 cmH2O CV: RRR, no MRGs  Abdomen: Soft, non-tender; no masses or HSM Extremities: + anasarca  Skin: Normal temperature, turgor and texture; no rash,  Psych: agitated. No focal motor def  LABS:  BMET  Recent Labs Lab 09/02/17 0303 09/02/17 1732 09/03/17 0408  NA 144 145 142  K 3.5 2.9* 3.3*  CL 121* 118* 114*  CO2 18* 19* 22  BUN 25* 24* 24*  CREATININE 1.33* 1.26* 1.23*   GLUCOSE 86 169* 205*    Electrolytes  Recent Labs Lab 09/01/17 1611 09/02/17 0303 09/02/17 1732 09/03/17 0408  CALCIUM 7.4* 7.6* 7.6* 7.4*  MG 1.9  --  2.0 1.9  PHOS  --   --  1.6* 2.9    CBC  Recent Labs Lab 09/01/17 0310 09/01/17 1611 09/02/17 0303 09/03/17 0408  WBC 33.0*  --  21.7* 12.0*  HGB 10.6* 9.6* 8.8* 8.9*  HCT 31.2* 30.5* 27.6* 26.2*  PLT 261  --  219 204    Coag's  Recent Labs Lab 09/10/2017 0620  APTT 32  INR 1.91    Sepsis Markers  Recent Labs Lab 08/28/17 2015 09/12/2017 0611 09/07/2017 1807  LATICACIDVEN 1.1 3.45*  --   PROCALCITON  --   --  1.04    ABG  Recent Labs Lab 09/01/17 2330 09/02/17 1135 09/02/17 1933  PHART 7.268* 7.418 7.538*  PCO2ART 36.7 27.7* 29.5*  PO2ART 39.0* 103 121.0*    Liver Enzymes  Recent Labs Lab 08/28/17 2015 08/25/2017 0620 09/01/17 0310  AST 16 23 14*  ALT 13* 12* 9*  ALKPHOS 68 46 34*  BILITOT 0.5 0.4 0.4  ALBUMIN 3.9 3.4* 2.1*  Cardiac Enzymes  Recent Labs Lab 09/05/2017 0622  TROPONINI <0.03    Glucose  Recent Labs Lab 09/02/17 1208 09/02/17 1611 09/02/17 1946 09/02/17 2330 09/03/17 0307 09/03/17 0730  GLUCAP 117* 144* 174* 231* 189* 163*    Imaging Dg Chest Port 1 View  Result Date: 09/02/2017 CLINICAL DATA:  Right central line placement. EXAM: PORTABLE CHEST 1 VIEW COMPARISON:  Radiograph of same day. FINDINGS: Stable cardiomediastinal silhouette. Atherosclerosis of thoracic aorta is noted. Mild central pulmonary vascular congestion is noted as well as bilateral diffuse interstitial densities are noted concerning for worsening pulmonary edema or less likely inflammation. No pneumothorax is noted. Mild bibasilar atelectasis or edema is noted with associated pleural effusions. Nasogastric tube is seen entering stomach. Interval placement of right internal jugular catheter is noted with distal tip in expected position of the SVC. Endotracheal tube tip is seen at the carina.  Bony thorax is unremarkable. IMPRESSION: Mild central pulmonary vascular congestion with bilateral diffuse interstitial densities concerning for worsening pulmonary edema or inflammation. Aortic atherosclerosis. Mild bibasilar subsegmental atelectasis or edema is noted with associated mild pleural effusions. Interval placement of right internal jugular catheter with distal tip in expected position of the SVC. Endotracheal tube tip is seen at the carina. Withdrawal by another 2-3 cm is recommended. These results will be called to the ordering clinician or representative by the Radiologist Assistant, and communication documented in the PACS or zVision Dashboard. Electronically Signed   By: Marijo Conception, M.D.   On: 09/02/2017 09:52     STUDIES:  EGD 9/08 >> Grade D erosive esophagitis, large HH  CULTURES: Sputum 9/08 >>abundant gpc pairs >>> Urine 9/08 >> negative  ANTIBIOTICS: Zosyn 9/08 >>   SIGNIFICANT EVENTS: 9/08 Transfer from [facility], GI consulted  LINES/TUBES: ETT 9/08 >>  ASSESSMENT / PLAN:  Upper GI bleeding 2nd to erosive esophagitis w/ associated ABL anemia  -Hgb trending down from 9.6 to 8.8 no report of bleeding overnight. Suspect that this may be dilutional to some extent at this point.  Large hiatal hernia. Plan PPI BID for at least 8 weeks EGD 2-3 mo after dc w/ f/u w/ GI 4-6 weeks after DC No nsaids Cont tube feeds  Acute hypoxic respiratory failure with compromised airway. Tobacco abuse with COPD. Pulmonary Edema Iatrogenic respiratory alkalosis  Possible aspiration PNA Left mainstem intubation  -weaning some on PSV of 10. Got fairly agitated on SBT.   -she is still 8.8 liters positive. Think we need to get some more volume off before we can expect extubation to be successful  Sputum w/ nml flora; fever curve and wbc better.  Plan Cont full vent support; adjust resting RR to 16 (this should allow her to set her own Ve as well as address alkalosis  Cont  PSV Lasix 80 mg IV q 8 x 3 doses Repeat CXR now and in am Day 4 abx; will change to rocephin and complete 7d   On-going hypotension (resolved); suspect that this was drug related. Pressors now off Hx of HTN. 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 Plan Cont synthroid via tube  Hx of depression. Mild  acute encephalopathy ->still anxious at times. Will follow some commands Plan Cont elavil, klonopin and prozac PAD protocol w/ precedex; gaol RASS 0 to -1  DM w/ hyperglycemia  Plan ssi w/ basal aspart   DVT prophylaxis - SCDs SUP - PPI Nutrition - NPO-->start tubefeeds Goals of care - full code   Summary   More awake. No distress but failed SBT. Does look more comfortable on PSV of 10 but still needs sedation. I think that volume overload is a big barrier for her. She is > 8 liters +. For today: Cont supportive care Cont PPI Added lasix q8 Cont precedex Hope extubation next 24 hours or so.   My cct 75 min  Erick Colace ACNP-BC [place] Pager # [phone] OR # [phone] if no answer

## 2017-09-03 NOTE — ED Notes (Signed)
47mcg Fentanyl and 1mg  Versed wasted. Witnessed by Ginger.

## 2017-09-04 ENCOUNTER — Inpatient Hospital Stay (HOSPITAL_COMMUNITY): Payer: Medicare Other

## 2017-09-04 LAB — BASIC METABOLIC PANEL
ANION GAP: 5 (ref 5–15)
BUN: 23 mg/dL — AB (ref 6–20)
CALCIUM: 7.7 mg/dL — AB (ref 8.9–10.3)
CO2: 28 mmol/L (ref 22–32)
CREATININE: 1.13 mg/dL — AB (ref 0.44–1.00)
Chloride: 111 mmol/L (ref 101–111)
GFR calc Af Amer: 55 mL/min — ABNORMAL LOW (ref >60)
GFR calc non Af Amer: 47 mL/min — ABNORMAL LOW (ref >60)
GLUCOSE: 168 mg/dL — AB (ref 65–99)
Potassium: 4.1 mmol/L (ref 3.5–5.1)
Sodium: 144 mmol/L (ref 135–145)

## 2017-09-04 LAB — PROTEIN, TOTAL: Total Protein: 5.1 g/dL — ABNORMAL LOW (ref 6.5–8.1)

## 2017-09-04 LAB — BLOOD GAS, ARTERIAL
ACID-BASE EXCESS: 5.7 mmol/L — AB (ref 0.0–2.0)
BICARBONATE: 29.7 mmol/L — AB (ref 20.0–28.0)
Drawn by: 347621
FIO2: 0.6
LHR: 16 {breaths}/min
O2 SAT: 92.5 %
PEEP/CPAP: 5 cmH2O
PH ART: 7.447 (ref 7.350–7.450)
Patient temperature: 98.6
VT: 400 mL
pCO2 arterial: 43.7 mmHg (ref 32.0–48.0)
pO2, Arterial: 59.7 mmHg — ABNORMAL LOW (ref 83.0–108.0)

## 2017-09-04 LAB — PHOSPHORUS: PHOSPHORUS: 3 mg/dL (ref 2.5–4.6)

## 2017-09-04 LAB — LACTATE DEHYDROGENASE: LDH: 282 U/L — AB (ref 98–192)

## 2017-09-04 LAB — BODY FLUID CELL COUNT WITH DIFFERENTIAL
EOS FL: 0 %
Lymphs, Fluid: 4 %
Monocyte-Macrophage-Serous Fluid: 14 % — ABNORMAL LOW (ref 50–90)
Neutrophil Count, Fluid: 82 % — ABNORMAL HIGH (ref 0–25)
Total Nucleated Cell Count, Fluid: 840 cu mm (ref 0–1000)

## 2017-09-04 LAB — GLUCOSE, CAPILLARY
GLUCOSE-CAPILLARY: 138 mg/dL — AB (ref 65–99)
GLUCOSE-CAPILLARY: 89 mg/dL (ref 65–99)
GLUCOSE-CAPILLARY: 171 mg/dL — AB (ref 65–99)
Glucose-Capillary: 144 mg/dL — ABNORMAL HIGH (ref 65–99)
Glucose-Capillary: 109 mg/dL — ABNORMAL HIGH (ref 65–99)
Glucose-Capillary: 114 mg/dL — ABNORMAL HIGH (ref 65–99)

## 2017-09-04 LAB — CBC
HEMATOCRIT: 29.2 % — AB (ref 36.0–46.0)
Hemoglobin: 9.5 g/dL — ABNORMAL LOW (ref 12.0–15.0)
MCH: 30.5 pg (ref 26.0–34.0)
MCHC: 32.5 g/dL (ref 30.0–36.0)
MCV: 93.9 fL (ref 78.0–100.0)
PLATELETS: 224 10*3/uL (ref 150–400)
RBC: 3.11 MIL/uL — ABNORMAL LOW (ref 3.87–5.11)
RDW: 17.4 % — ABNORMAL HIGH (ref 11.5–15.5)
WBC: 15.9 10*3/uL — ABNORMAL HIGH (ref 4.0–10.5)

## 2017-09-04 LAB — PROTEIN, PLEURAL OR PERITONEAL FLUID: Total protein, fluid: <3 g/dL

## 2017-09-04 LAB — CHOLESTEROL, TOTAL: CHOLESTEROL: 99 mg/dL (ref 0–200)

## 2017-09-04 LAB — LACTATE DEHYDROGENASE, PLEURAL OR PERITONEAL FLUID: LD FL: 147 U/L — AB (ref 3–23)

## 2017-09-04 LAB — MAGNESIUM: Magnesium: 1.6 mg/dL — ABNORMAL LOW (ref 1.7–2.4)

## 2017-09-04 MED ORDER — POTASSIUM CHLORIDE 20 MEQ/15ML (10%) PO SOLN
40.0000 meq | Freq: Three times a day (TID) | ORAL | Status: AC
  Administered 2017-09-04 (×2): 40 meq
  Filled 2017-09-04 (×3): qty 30

## 2017-09-04 MED ORDER — FUROSEMIDE 10 MG/ML IJ SOLN
80.0000 mg | Freq: Three times a day (TID) | INTRAMUSCULAR | Status: AC
  Administered 2017-09-04 – 2017-09-05 (×3): 80 mg via INTRAVENOUS
  Filled 2017-09-04 (×3): qty 8

## 2017-09-04 MED ORDER — SODIUM CHLORIDE 0.9 % IV BOLUS (SEPSIS)
500.0000 mL | Freq: Once | INTRAVENOUS | Status: AC
  Administered 2017-09-04: 500 mL via INTRAVENOUS

## 2017-09-04 MED ORDER — MAGNESIUM SULFATE 4 GM/100ML IV SOLN
4.0000 g | Freq: Once | INTRAVENOUS | Status: AC
  Administered 2017-09-04: 4 g via INTRAVENOUS
  Filled 2017-09-04: qty 100

## 2017-09-04 NOTE — Progress Notes (Signed)
PULMONARY / CRITICAL CARE MEDICINE   Name: Rachel Dodson MRN: [id number] DOB: 1946-01-26    ADMISSION DATE:  09/05/2017  REFERRING MD:  [facility]  CHIEF COMPLAINT:  Black emesis.  HISTORY OF PRESENT ILLNESS:   71 yo female smoker from SNF to [facility] ER with hematemesis and melena.  Intubated for airway protection and transfer to [facility].  Had recent admit for E coli UTI.  PMHx of hyponatremia, HH, depression, hypothyroidism, hypertension, COPD.  SUBJECTIVE:  Sedated on vent   VITAL SIGNS: BP (!) 94/54 (BP Location: Left Arm)   Pulse (!) 101   Temp 98.4 F (36.9 C) (Axillary)   Resp 18   Ht 5\' 1"  (1.549 m)   Wt 145 lb 15.1 oz (66.2 kg)   SpO2 95%   BMI 27.58 kg/m   VENTILATOR SETTINGS: Vent Mode: PRVC FiO2 (%):  [40 %-100 %] 60 % Set Rate:  [16 bmp] 16 bmp Vt Set:  [400 mL] 400 mL PEEP:  [5 cmH20] 5 cmH20 Plateau Pressure:  [10 cmH20-18 cmH20] 10 cmH20  INTAKE / OUTPUT:  Intake/Output Summary (Last 24 hours) at 09/04/17 1027 Last data filed at 09/04/17 0800  Gross per 24 hour  Intake          2863.21 ml  Output             6620 ml  Net         -3756.79 ml     PHYSICAL EXAMINATION: General appearance:  71 Year old chronically ill appearing female, sedated on vent still gets agitated Eyes: anicteric sclerae, moist conjunctivae; PERRL, EOMI bilaterally. Mouth:  Mucous membranes and no mucosal ulcerations; orally intubated Neck: Trachea midline; neck supple, no JVD Lungs/chest: diffuse rhonchi, with normal respiratory effort and no intercostal retractions CV: Regular irreg , no MRGs  Abdomen: Soft, non-tender; no masses or HSM Extremities:diffuse anasarca  Skin: Normal temperature, turgor and texture; no rash, ulcers or subcutaneous nodules Psych: moves all extremities. Agitated at times  LABS:  BMET  Recent Labs Lab 09/02/17 1732 09/03/17 0408 09/04/17 0420  NA 145 142 144  K 2.9* 3.3* 4.1  CL 118* 114* 111  CO2 19* 22 28  BUN 24* 24* 23*  CREATININE  1.26* 1.23* 1.13*  GLUCOSE 169* 205* 168*    Electrolytes  Recent Labs Lab 09/02/17 1732 09/03/17 0408 09/04/17 0420  CALCIUM 7.6* 7.4* 7.7*  MG 2.0 1.9 1.6*  PHOS 1.6* 2.9 3.0    CBC  Recent Labs Lab 09/02/17 0303 09/03/17 0408 09/04/17 0420  WBC 21.7* 12.0* 15.9*  HGB 8.8* 8.9* 9.5*  HCT 27.6* 26.2* 29.2*  PLT 219 204 224    Coag's  Recent Labs Lab 09/14/2017 0620  APTT 32  INR 1.91    Sepsis Markers  Recent Labs Lab 08/28/17 2015 09/14/2017 0611 09/08/2017 1807  LATICACIDVEN 1.1 3.45*  --   PROCALCITON  --   --  1.04    ABG  Recent Labs Lab 09/02/17 1933 09/03/17 0911 09/04/17 0342  PHART 7.538* 7.387 7.447  PCO2ART 29.5* 39.1 43.7  PO2ART 121.0* 75.7* 59.7*    Liver Enzymes  Recent Labs Lab 08/28/17 2015 09/21/2017 0620 09/01/17 0310  AST 16 23 14*  ALT 13* 12* 9*  ALKPHOS 68 46 34*  BILITOT 0.5 0.4 0.4  ALBUMIN 3.9 3.4* 2.1*    Cardiac Enzymes  Recent Labs Lab 08/26/2017 0622  TROPONINI <0.03    Glucose  Recent Labs Lab 09/03/17 1158 09/03/17 1544 09/03/17 2004 09/03/17  2350 09/04/17 0355 09/04/17 0812  GLUCAP 136* 139* 154* 119* 144* 138*    Imaging Dg Chest Port 1 View  Result Date: 09/04/2017 CLINICAL DATA:  Followup pneumonia EXAM: PORTABLE CHEST 1 VIEW COMPARISON:  09/03/2017 FINDINGS: Endotracheal tube tip remains 2 cm above the carina. Nasogastric tube enters stomach. Port tip remains in the SVC 4 cm above the right atrium. Diffuse airspace density is worsening consistent with edema or pneumonia. Small effusions present bilaterally as well. IMPRESSION: Worsening diffuse airspace density that could be edema and/or pneumonia. Electronically Signed   By: Nelson Chimes M.D.   On: 09/04/2017 07:05   Dg Chest Port 1 View  Result Date: 09/03/2017 CLINICAL DATA:  Pulmonary edema, followup EXAM: PORTABLE CHEST 1 VIEW COMPARISON:  Portable chest x-ray of 09/02/2017 FINDINGS: The tip of the endotracheal tube is now  approximately 2.1 cm above the carina. There are bilateral pleural effusions with basilar atelectasis and probable mild pulmonary vascular congestion present. Cardiomegaly is stable. Right central venous line tip overlies the mid SVC. IMPRESSION: 1. Bilateral pleural effusions and probable pulmonary vascular congestion. 2. Tip of endotracheal tube 2.1 cm above the carina. Electronically Signed   By: Ivar Drape M.D.   On: 09/03/2017 09:49     STUDIES:  EGD 9/08 >> Grade D erosive esophagitis, large HH  CULTURES: Sputum 9/08 >>abundant gpc pairs >>>NOF Urine 9/08 >> negative  ANTIBIOTICS: Zosyn 9/08 >> 9/11 Rocephin 9/11>>>  SIGNIFICANT EVENTS: 9/08 Transfer from [facility], GI consulted  LINES/TUBES: ETT 9/08 >>  ASSESSMENT / PLAN:  Upper GI bleeding 2nd to erosive esophagitis w/ associated ABL anemia  -Hgb trending down from 9.6 to 8.8 no report of bleeding overnight. Suspect that this may be dilutional to some extent at this point.  Large hiatal hernia. Plan ppi for 8 weeks EGD 2-3 mo w/ GI f/u at the 4-6 wk point after dc No nsaids Cont tubefeeds  Acute hypoxic respiratory failure with compromised airway. Tobacco abuse with COPD. Pulmonary Edema Iatrogenic respiratory alkalosis -->resolved Possible aspiration PNA PCXR w/ elevated LHD d/t hiatal hernia. Worsening edema/effusions Plan Cont lasix eval chest w/ US-->if sig effusion therapeutic thora would be helpful Repeat CXR in am  Day 5/7 rocephin     On-going hypotension (resolved); suspect that this was drug related. Hx of HTN. 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 Plan Cont synthroid   Hx of depression. Mild acute encephalopathy ->still anxious at times. Will follow some commands Plan Cont elavil, klonopin & prozac PAD protocol RASS 0 to -1; cont precedex  DM w/ hyperglycemia  Plan ssi w/ basal aspart   DVT prophylaxis - SCDs SUP - PPI Nutrition - NPO-->start tubefeeds Goals of care - full code   Summary  Needs thora Cont lasix and weaning [place] extubation soon  cct 32 min   Erick Colace ACNP-BC [place] Pager # [phone] OR # [phone] if no answer

## 2017-09-04 NOTE — Progress Notes (Signed)
   SBP 98 and MAP 58 on aline HR 90 Pulse ox 98%  BP dropped afrter lasix  Plan - 500cc fluid bolus  Dr. Brand Males, M.D., [facility].C.P Pulmonary and Critical Care Medicine Staff Physician [place] [facility] Pager: [phone], If no answer or between  15:00h - 7:00h: call [phone]  09/04/2017 6:25 PM

## 2017-09-04 NOTE — Procedures (Signed)
Thoracentesis Procedure Note  Pre-operative Diagnosis: right  pleural effusion   Post-operative Diagnosis: same  Indications: weaning and fluid assessment   Procedure Details  Consent: Informed consent was obtained. Risks of the procedure were discussed including: infection, bleeding, pain, pneumothorax.  Under sterile conditions the patient was positioned. Betadine solution and sterile drapes were utilized.  1% buffered lidocaine was used to anesthetize the  rib space which was identified via real time [place]. Fluid was obtained without any difficulties and minimal blood loss.  A dressing was applied to the wound and wound care instructions were provided.   Findings 550 ml of clear pleural fluid was obtained. A sample was sent to Pathology for cytogenetics, flow, and cell counts, as well as for infection analysis.  Complications:  None; patient tolerated the procedure well.          Condition: stable  Plan A follow up chest x-ray was ordered. Bed Rest for 0 hours. Tylenol 650 mg. for pain.  Erick Colace ACNP-BC [place] Pager # [phone] OR # [phone] if no answer

## 2017-09-05 LAB — MAGNESIUM: MAGNESIUM: 2.6 mg/dL — AB (ref 1.7–2.4)

## 2017-09-05 LAB — COMPREHENSIVE METABOLIC PANEL
ALT: 19 U/L (ref 14–54)
ANION GAP: 10 (ref 5–15)
AST: 20 U/L (ref 15–41)
Albumin: 1.9 g/dL — ABNORMAL LOW (ref 3.5–5.0)
Alkaline Phosphatase: 36 U/L — ABNORMAL LOW (ref 38–126)
BUN: 30 mg/dL — ABNORMAL HIGH (ref 6–20)
CALCIUM: 7.8 mg/dL — AB (ref 8.9–10.3)
CHLORIDE: 105 mmol/L (ref 101–111)
CO2: 29 mmol/L (ref 22–32)
Creatinine, Ser: 1.23 mg/dL — ABNORMAL HIGH (ref 0.44–1.00)
GFR calc non Af Amer: 43 mL/min — ABNORMAL LOW (ref >60)
GFR, EST AFRICAN AMERICAN: 50 mL/min — AB (ref >60)
Glucose, Bld: 160 mg/dL — ABNORMAL HIGH (ref 65–99)
Potassium: 4.6 mmol/L (ref 3.5–5.1)
SODIUM: 144 mmol/L (ref 135–145)
Total Bilirubin: 0.4 mg/dL (ref 0.3–1.2)
Total Protein: 5 g/dL — ABNORMAL LOW (ref 6.5–8.1)

## 2017-09-05 LAB — GLUCOSE, CAPILLARY
GLUCOSE-CAPILLARY: 153 mg/dL — AB (ref 65–99)
GLUCOSE-CAPILLARY: 107 mg/dL — AB (ref 65–99)
GLUCOSE-CAPILLARY: 99 mg/dL (ref 65–99)
Glucose-Capillary: 153 mg/dL — ABNORMAL HIGH (ref 65–99)
Glucose-Capillary: 103 mg/dL — ABNORMAL HIGH (ref 65–99)

## 2017-09-05 LAB — CBC
HCT: 29.1 % — ABNORMAL LOW (ref 36.0–46.0)
HEMOGLOBIN: 8.8 g/dL — AB (ref 12.0–15.0)
MCH: 29.2 pg (ref 26.0–34.0)
MCHC: 30.2 g/dL (ref 30.0–36.0)
MCV: 96.7 fL (ref 78.0–100.0)
PLATELETS: 253 10*3/uL (ref 150–400)
RBC: 3.01 MIL/uL — ABNORMAL LOW (ref 3.87–5.11)
RDW: 17.1 % — AB (ref 11.5–15.5)
WBC: 14.9 10*3/uL — AB (ref 4.0–10.5)

## 2017-09-05 LAB — POCT I-STAT 3, ART BLOOD GAS (G3+)
Acid-Base Excess: 11 mmol/L — ABNORMAL HIGH (ref 0.0–2.0)
BICARBONATE: 37.5 mmol/L — AB (ref 20.0–28.0)
O2 Saturation: 81 %
TCO2: 39 mmol/L — ABNORMAL HIGH (ref 22–32)
pCO2 arterial: 64 mmHg — ABNORMAL HIGH (ref 32.0–48.0)
pH, Arterial: 7.377 (ref 7.350–7.450)
pO2, Arterial: 49 mmHg — ABNORMAL LOW (ref 83.0–108.0)

## 2017-09-05 LAB — C DIFFICILE QUICK SCREEN W PCR REFLEX
C DIFFICILE (CDIFF) INTERP: NOT DETECTED
C DIFFICILE (CDIFF) TOXIN: NEGATIVE
C Diff antigen: NEGATIVE

## 2017-09-05 LAB — PH, BODY FLUID: pH, Body Fluid: 8

## 2017-09-05 LAB — RHEUMATOID FACTORS, FLUID: RHEUMATOID ARTHRITIS, QN/FLUID: NEGATIVE

## 2017-09-05 LAB — PHOSPHORUS: PHOSPHORUS: 3.5 mg/dL (ref 2.5–4.6)

## 2017-09-05 MED ORDER — LABETALOL HCL 5 MG/ML IV SOLN: 20.0000 mg | INTRAVENOUS | Status: DC | PRN

## 2017-09-05 MED ORDER — LABETALOL HCL 5 MG/ML IV SOLN
20.0000 mg | INTRAVENOUS | Status: DC | PRN
Start: 2017-09-05 — End: 2017-09-05
  Administered 2017-09-05: 20 mg via INTRAVENOUS
  Filled 2017-09-05: qty 4

## 2017-09-05 MED ORDER — LABETALOL HCL 5 MG/ML IV SOLN
INTRAVENOUS | Status: AC
  Filled 2017-09-05: qty 4

## 2017-09-05 MED ORDER — MORPHINE SULFATE (PF) 2 MG/ML IV SOLN
2.0000 mg | INTRAVENOUS | Status: DC | PRN
  Administered 2017-09-06 (×2): 2 mg via INTRAVENOUS
  Administered 2017-09-06: 4 mg via INTRAVENOUS
  Filled 2017-09-05: qty 2
  Filled 2017-09-05 (×2): qty 1

## 2017-09-05 NOTE — Progress Notes (Signed)
Patient placed on heated high flow nasal cannula per MD.  Flow currently set at 55L and FIO2 of 80%.  Will continue to monitor.

## 2017-09-05 NOTE — Progress Notes (Signed)
Spoke w/ patient's POA. All of her family have spoken. They all agree that her quality of life is poor and that trach and prolonged vent would not be acceptable outcomes for Rachel Dodson. They are all aware that she is unlikely to survive. They do not wish for [place] to place her back on mechanical ventilation.   Plan Cont current supportive  Care DNR  Erick Colace ACNP-BC [place] Pager # [phone] OR # [phone] if no answer

## 2017-09-05 NOTE — Progress Notes (Signed)
Pt extubated earlier today.   Status a little tenuous -->very weak; poor cough mechanics, requiring escalating oxygen.  Currently trying high flow supplemental oxygen.   Will speak to family. If re-intubated I fear we will be looking a long course w/ trach SNF etc..  Erick Colace ACNP-BC [place] Pager # [phone] OR # [phone] if no answer

## 2017-09-05 NOTE — Procedures (Signed)
Extubation Procedure Note  Patient Details:   Name: Rachel Dodson DOB: 1946/08/23 MRN: [id number]   Airway Documentation:     Evaluation  O2 sats: stable throughout Complications: No apparent complications Patient did tolerate procedure well. Bilateral Breath Sounds: Clear, Diminished   Yes   Patient extubated to 3L nasal cannula per MD order.  Positive cuff leak noted.  No evidence of stridor.  Patient able to speak post extubation.  Sats currently 91%.  No complications noted.  Will continue to monitor.   Alphia Moh N 09/05/2017, 10:08 AM

## 2017-09-05 NOTE — Progress Notes (Signed)
Patient began to drop her sats into low 80s on heated high flow.  Placed patient on non-rebreather however did not help.  Placed patient on heated high flow and non-rebreather together and sats still did not improve out of 80s.  Gave patient 1400 treatment as well under the non-rebreather mask and sats still did not improve.  NTS patient and suctioned moderate amount of thick white sputum and noted that patient had very weak cough.  MD notified.  Patient has been made DNR.  Placed patient on bipap on maximum amount of settings.  Sats currently at 97%.  Will continue to monitor.

## 2017-09-05 NOTE — Progress Notes (Signed)
PULMONARY / CRITICAL CARE MEDICINE   Name: Rachel Dodson MRN: [id number] DOB: 02/24/1946    ADMISSION DATE:  09/15/2017  REFERRING MD:  [facility]  CHIEF COMPLAINT:  Black emesis.  HISTORY OF PRESENT ILLNESS:   71 yo female smoker from SNF to [facility] ER with hematemesis and melena.  Intubated for airway protection and transfer to [facility].  Had recent admit for E coli UTI.  PMHx of hyponatremia, HH, depression, hypothyroidism, hypertension, COPD.  SUBJECTIVE:  More awake Weaning on PSV  VITAL SIGNS: BP 116/68 (BP Location: Left Arm)   Pulse (!) 110   Temp (!) 100.4 F (38 C) (Oral)   Resp (!) 23   Ht 5\' 1"  (1.549 m)   Wt 138 lb 14.2 oz (63 kg)   SpO2 96%   BMI 26.24 kg/m   VENTILATOR SETTINGS: Vent Mode: PRVC FiO2 (%):  [40 %-50 %] 50 % Set Rate:  [16 bmp] 16 bmp Vt Set:  [400 mL] 400 mL PEEP:  [5 cmH20] 5 cmH20 Pressure Support:  [10 cmH20-15 cmH20] 15 cmH20 Plateau Pressure:  [12 cmH20] 12 cmH20  INTAKE / OUTPUT:  Intake/Output Summary (Last 24 hours) at 09/05/17 0857 Last data filed at 09/05/17 0800  Gross per 24 hour  Intake           3006.2 ml  Output             3875 ml  Net           -868.8 ml     PHYSICAL EXAMINATION: General appearance:  Frail 71 Year old  Female, chronically ill appearing,  NAD on SBT  Eyes: anicteric sclerae, moist conjunctivae; PERRL, EOMI bilaterally. Mouth:  Moist membranes and no mucosal ulcerations; orally intubated Neck: Trachea midline; neck supple, no JVD, right IJ intact Lungs/chest:scattered rhonchi  with normal respiratory effort and no intercostal retractions on SBT. She is kyphotic  CV: regular irreg  no MRGs  Abdomen: Soft, non-tender; no masses or HSM Extremities: + peripheral edema or extremity lymphadenopathy Skin: Normal temperature, turgor and texture; no rash, ulcers or subcutaneous nodules Psych: more awake and interactive  LABS:  BMET  Recent Labs Lab 09/03/17 0408 09/04/17 0420 09/05/17 0400  NA 142 144  144  K 3.3* 4.1 4.6  CL 114* 111 105  CO2 22 28 29   BUN 24* 23* 30*  CREATININE 1.23* 1.13* 1.23*  GLUCOSE 205* 168* 160*    Electrolytes  Recent Labs Lab 09/03/17 0408 09/04/17 0420 09/05/17 0400  CALCIUM 7.4* 7.7* 7.8*  MG 1.9 1.6* 2.6*  PHOS 2.9 3.0 3.5    CBC  Recent Labs Lab 09/03/17 0408 09/04/17 0420 09/05/17 0400  WBC 12.0* 15.9* 14.9*  HGB 8.9* 9.5* 8.8*  HCT 26.2* 29.2* 29.1*  PLT 204 224 253    Coag's  Recent Labs Lab 09/07/2017 0620  APTT 32  INR 1.91    Sepsis Markers  Recent Labs Lab 08/27/2017 0611 08/30/2017 1807  LATICACIDVEN 3.45*  --   PROCALCITON  --  1.04    ABG  Recent Labs Lab 09/02/17 1933 09/03/17 0911 09/04/17 0342  PHART 7.538* 7.387 7.447  PCO2ART 29.5* 39.1 43.7  PO2ART 121.0* 75.7* 59.7*    Liver Enzymes  Recent Labs Lab 08/26/2017 0620 09/01/17 0310 09/05/17 0400  AST 23 14* 20  ALT 12* 9* 19  ALKPHOS 46 34* 36*  BILITOT 0.4 0.4 0.4  ALBUMIN 3.4* 2.1* 1.9*    Cardiac Enzymes  Recent Labs Lab 08/30/2017 0175  TROPONINI <0.03    Glucose  Recent Labs Lab 09/04/17 1138 09/04/17 1543 09/04/17 1932 09/04/17 2313 09/05/17 0341 09/05/17 0801  GLUCAP 89 171* 109* 114* 153* 153*    Imaging Dg Chest Port 1 View  Result Date: 09/04/2017 CLINICAL DATA:  Status post left-sided thoracentesis. EXAM: PORTABLE CHEST 1 VIEW COMPARISON:  Portable chest x-ray of 4:26 a.m. today. FINDINGS: The volume of pleural fluid on the left has decreased considerably. There is no postprocedure pneumothorax. On the right a small amount fluid is present at the lung base. The interstitial markings remain increased. There is infiltrate at the right lung base and likely in the retrocardiac region on the left. The cardiac silhouette is enlarged. There is calcification in the wall of the aortic arch. The endotracheal tube tip lies 2.5 cm above the carina. The esophagogastric tube tip in proximal port lie below the GE junction. The  right internal jugular venous catheter tip projects over the proximal SVC. IMPRESSION: No postprocedure complication following left-sided thoracentesis. Markedly decreased volume of pleural fluid on the left. Bibasilar atelectasis or pneumonia. Mild pulmonary interstitial edema or interstitial pneumonia. Thoracic aortic atherosclerosis. Electronically Signed   By: David  Martinique M.D.   On: 09/04/2017 12:37     STUDIES:  EGD 9/08 >> Grade D erosive esophagitis, large HH  CULTURES: Sputum 9/08 >>abundant gpc pairs >>>NOF Urine 9/08 >> negative  ANTIBIOTICS: Zosyn 9/08 >> 9/11 Rocephin 9/11>>>  SIGNIFICANT EVENTS: 9/08 Transfer from [facility], GI consulted  LINES/TUBES: ETT 9/08 >> Right IJ CVL 9/11>>>  ASSESSMENT / PLAN:  Upper GI bleeding 2nd to erosive esophagitis w/ associated ABL anemia  -Hgb trending down from 9.6 to 8.8 no report of bleeding overnight. Suspect that this may be dilutional to some extent at this point.  Large hiatal hernia. Plan PPI for 8 weeks EGD 2-3 mo. GI f/u 4-6 weeks  Acute hypoxic respiratory failure with compromised airway. Tobacco abuse with COPD. Pulmonary Edema w/ transudative right pleural effusion Iatrogenic respiratory alkalosis -->resolved Possible aspiration PNA PCXR improved aeration following thora and diuresis  Plan Looks comfortable on PSV SBT and assess for extubation Wean precedex Aspiration precautions Wean Oxygen  Hold tube feeds Day 6/7 rocephin   On-going hypotension (resolved); suspect that this was drug related. Hx of HTN. 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 Plan Cont  synthroid   Hx of depression. Mild acute encephalopathy ->still anxious at times. Will follow some commands Plan Cont elavil, Klonopin and prozac Cont PAD protocol RASS 0 to -1 (on precedex)   DM w/ hyperglycemia  Plan ssi w/ basal aspart   DVT prophylaxis - SCDs SUP - PPI Nutrition - NPO-->start tubefeeds Goals of care - full code   Summary  Looks better Ready to extubate Cont rx as above  My cct 42 min   Erick Colace ACNP-BC [place] Pager # [phone] OR # [phone] if no answer

## 2017-09-06 LAB — CHOLESTEROL, BODY FLUID: CHOL FL: 19 mg/dL

## 2017-09-06 LAB — GLUCOSE, CAPILLARY
Glucose-Capillary: 100 mg/dL — ABNORMAL HIGH (ref 65–99)
Glucose-Capillary: 93 mg/dL (ref 65–99)
Glucose-Capillary: 95 mg/dL (ref 65–99)
Glucose-Capillary: 97 mg/dL (ref 65–99)

## 2017-09-06 LAB — ADENOSIDE DEAMINASE, PLEURAL FL

## 2017-09-06 MED ORDER — FUROSEMIDE 10 MG/ML IJ SOLN
80.0000 mg | Freq: Once | INTRAMUSCULAR | Status: AC
Start: 2017-09-06 — End: 2017-09-06
  Administered 2017-09-06: 80 mg via INTRAVENOUS

## 2017-09-06 MED ORDER — FUROSEMIDE 10 MG/ML IJ SOLN
INTRAMUSCULAR | Status: AC
  Filled 2017-09-06: qty 8

## 2017-09-06 MED ORDER — SODIUM CHLORIDE 0.9 % IV SOLN
5.0000 mg/h | INTRAVENOUS | Status: DC
  Administered 2017-09-06: 5 mg/h via INTRAVENOUS
  Filled 2017-09-06: qty 10

## 2017-09-07 LAB — BODY FLUID CULTURE
Culture: NO GROWTH
SPECIAL REQUESTS: NORMAL

## 2017-09-09 ENCOUNTER — Telehealth: Payer: Self-pay

## 2017-09-09 NOTE — Telephone Encounter (Signed)
On 09/09/17 I received a death certificate from [place] (faxed). The death certificate is for cremation. The patient is a patient of Doctor Maneem. The death certificate will be taken to Pulmonary Unit @ [facility] this pm for signature.  On 09/12/17 I received the death certificate back from Doctor Maneem. I got the death certificate ready and called the funeral home to let them know I faxed the d/c to the funeral home per the funeral home request.

## 2017-09-12 ENCOUNTER — Telehealth: Payer: Self-pay

## 2017-09-12 NOTE — Telephone Encounter (Signed)
On 09/12/17 I received a death certificate from [facility] (original). The death certificate is for cremation. The patient is a patient of Doctor Maneem. The death certificate will be taken to Pulmonary Unit @ [facility] this pm for signature.  On 09-19-17 I received the death certificate back from Doctor Maneem. I got the d/c ready and called the funeral home to let them know the d/c was mailed to vital records per the funeral home request.

## 2017-09-23 NOTE — Progress Notes (Signed)
Nutrition Brief Note  Chart reviewed. Pt now transitioning to comfort care.  Cortrak consult discontinued. No further nutrition interventions warranted at this time.  Please re-consult as needed.   Parks Ranger, MS, RDN, LDN Sep 18, 2017 11:50 AM

## 2017-09-23 NOTE — Progress Notes (Signed)
PULMONARY / CRITICAL CARE MEDICINE   Name: Rachel Dodson MRN: [id number] DOB: [birth year]    ADMISSION DATE:  09/09/2017  REFERRING MD:  [facility]  CHIEF COMPLAINT:  Black emesis.  HISTORY OF PRESENT ILLNESS:   71 yo female smoker from SNF to [facility] ER with hematemesis and melena.  Intubated for airway protection and transfer to [facility].  Had recent admit for E coli UTI.  PMHx of hyponatremia, HH, depression, hypothyroidism, hypertension, COPD.  SUBJECTIVE:  Appears comfortable on BIPAP   VITAL SIGNS: BP 126/61 (BP Location: Left Arm)   Pulse (!) 118   Temp 98.7 F (37.1 C) (Axillary)   Resp (!) 21   Ht 5\' 1"  (1.549 m)   Wt 138 lb 3.7 oz (62.7 kg)   SpO2 98%   BMI 26.12 kg/m   VENTILATOR SETTINGS: Vent Mode: BIPAP FiO2 (%):  [50 %-100 %] 50 % Set Rate:  [10 bmp] 10 bmp PEEP:  [5 cmH20-10 cmH20] 8 cmH20 Pressure Support:  [5 cmH20] 5 cmH20  INTAKE / OUTPUT:  Intake/Output Summary (Last 24 hours) at 2017-09-24 0842 Last data filed at 09/24/2017 0800  Gross per 24 hour  Intake              450 ml  Output             1150 ml  Net             -700 ml     PHYSICAL EXAMINATION: General appearance:  71 Year old  Chronically ill appearing female, sedated on BIPAP  Eyes: anicteric sclerae, moist conjunctivae; PERRL, EOMI bilaterally. Mouth: mucous membranes and no mucosal ulcerations;  Neck: Trachea midline; neck supple, no JVD Lungs/chest: scattered rhonchi, with normal respiratory effort and no intercostal retractions CV: RRR, no MRGs  Abdomen: Soft, non-tender; no masses or HSM Extremities: No sig peripheral edema or extremity lymphadenopathy Skin: Normal temperature, turgor and texture; no rash, ulcers or subcutaneous nodules Psych: lethargic opens eyes   LABS:  BMET  Recent Labs Lab 09/03/17 0408 09/04/17 0420 09/05/17 0400  NA 142 144 144  K 3.3* 4.1 4.6  CL 114* 111 105  CO2 22 28 29   BUN 24* 23* 30*  CREATININE 1.23* 1.13* 1.23*  GLUCOSE 205* 168* 160*     Electrolytes  Recent Labs Lab 09/03/17 0408 09/04/17 0420 09/05/17 0400  CALCIUM 7.4* 7.7* 7.8*  MG 1.9 1.6* 2.6*  PHOS 2.9 3.0 3.5    CBC  Recent Labs Lab 09/03/17 0408 09/04/17 0420 09/05/17 0400  WBC 12.0* 15.9* 14.9*  HGB 8.9* 9.5* 8.8*  HCT 26.2* 29.2* 29.1*  PLT 204 224 253    Coag's  Recent Labs Lab 08/30/2017 0620  APTT 32  INR 1.91    Sepsis Markers  Recent Labs Lab 09/05/2017 0611 09/21/2017 1807  LATICACIDVEN 3.45*  --   PROCALCITON  --  1.04    ABG  Recent Labs Lab 09/03/17 0911 09/04/17 0342 09/05/17 1412  PHART 7.387 7.447 7.377  PCO2ART 39.1 43.7 64.0*  PO2ART 75.7* 59.7* 49.0*    Liver Enzymes  Recent Labs Lab 09/18/2017 0620 09/01/17 0310 09/05/17 0400  AST 23 14* 20  ALT 12* 9* 19  ALKPHOS 46 34* 36*  BILITOT 0.4 0.4 0.4  ALBUMIN 3.4* 2.1* 1.9*    Cardiac Enzymes  Recent Labs Lab 09/20/2017 0622  TROPONINI <0.03    Glucose  Recent Labs Lab 09/05/17 1116 09/05/17 1557 09/05/17 1945 09/24/17 0018 2017/09/24 0350 Sep 24, 2017 0814  GLUCAP 103* 107* 99 97 95 100*    Imaging No results found.   STUDIES:  EGD 9/08 >> Grade D erosive esophagitis, large HH  CULTURES: Sputum 9/08 >>abundant gpc pairs >>>NOF Urine 9/08 >> negative  ANTIBIOTICS: Zosyn 9/08 >> 9/11 Rocephin 9/11>>>9/14  SIGNIFICANT EVENTS: 9/08 Transfer from [facility], GI consulted  LINES/TUBES: ETT 9/08 >>9/13 Right IJ CVL 9/11>>>  ASSESSMENT / PLAN:  Upper GI bleeding 2nd to erosive esophagitis w/ associated ABL anemia  -Hgb trending down from 9.6 to 8.8 no report of bleeding overnight. Suspect that this may be dilutional to some extent at this point.  Large hiatal hernia. Plan Cont PPI   Acute hypoxic respiratory failure with compromised airway. Tobacco abuse with COPD. Pulmonary Edema w/ transudative right pleural effusion Iatrogenic respiratory alkalosis -->resolved Possible aspiration PNA PCXR improved aeration following thora  and diuresis  ->extubated 9/13 ->very poor ineffective cough, very limited reserves. She is NOT a candidate for long term BIPAP as can't really protect airway. This was just to buy some time for family.  Plan Day 7/7 abx Lasix again today; morphine PRN [place] oxygen May need to transition to Comfort.    On-going hypotension (resolved); suspect that this was drug related. Hx of HTN. 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 Plan Cont synthroid   Hx of depression. Mild acute encephalopathy ->still anxious at times. Will follow some commands Plan Holding elavil, klonopin and prozac d/t NPO status PRN morphine   DM w/ hyperglycemia  Plan ssi w/ stop basal aspart  DVT prophylaxis - SCDs SUP - PPI Nutrition - NPO-->start tubefeeds Goals of care - full code   Summary  Extubated-->rapidly declined. I placed her on BIPAP to try to buy time for her family yo see her but this is NOT a therapy we should continue as she has no real cough. For today plan is to transition off BIPAP and if declines we will have to transition to comfort.   My cct 98 m  Erick Colace ACNP-BC [place] Pager # [phone] OR # [phone] if no answer

## 2017-09-23 NOTE — Progress Notes (Signed)
241mls Morphine wasted down the sink, witnessed by Duane Boston.

## 2017-09-23 NOTE — Progress Notes (Signed)
Patient deceased at 2006 family at bedside, no heart and lung sounds auscultated by Woodroe Chen RN and Rubye Beach RN, MD notified.

## 2017-09-23 NOTE — Discharge Summary (Signed)
Physician Discharge Summary  Patient ID: Rachel Dodson MRN: [id number] DOB/AGE: 04/17/1946 71 y.o.  Admit date: 09/04/2017 Discharge date: 2017-09-23  Admission Diagnoses: Hemorrhagic shock, GI bleed  Discharge Diagnoses:  Acute respiratory failure GI bleed Hemorrhagic shock Escherichia coli urosepsis  Discharged Condition: Deceased  Hospital Course:  71 yo female smoker from SNF to [facility] ER with hematemesis and melena.  Intubated for airway protection and transfer to [facility].  Had recent admit for E coli UTI.  PMHx of hyponatremia, HH, depression, hypothyroidism, hypertension, COPD.  She was diagnosed with upper GI bleed secondary to erosive esophagitis with acute blood loss anemia. She was successfully resuscitated. She however developed aspiration pneumonia, hypoxic respiratory failure, Escherichia coli urinary infection. A. fib with RVR. She was extubated on 9/13 but had tenuous respiratory status. On 9/14 her sats started to decline. After discussion with the patient's family the decision was made to change code status to DNR. She was transitioned to comfort care and started on morphine drip   Disposition: 20-Expired   Allergies as of Sep 23, 2017      Reactions   Latex       Medication List    ASK your doctor about these medications   albuterol 108 (90 Base) MCG/ACT inhaler Commonly known as:  PROVENTIL HFA;VENTOLIN HFA Inhale 2 puffs into the lungs every 4 (four) hours as needed for wheezing or shortness of breath.   amitriptyline 100 MG tablet Commonly known as:  ELAVIL Take 1 tablet (100 mg total) by mouth at bedtime.   amLODipine 5 MG tablet Commonly known as:  NORVASC Take 5 mg by mouth 2 (two) times daily.   budesonide-formoterol 160-4.5 MCG/ACT inhaler Commonly known as:  SYMBICORT Inhale 2 puffs into the lungs 2 (two) times daily.   ciprofloxacin 500 MG tablet Commonly known as:  CIPRO Take 1 tablet (500 mg total) by mouth 2 (two) times daily. Ask about: Should  I take this medication?   clonazePAM 1 MG tablet Commonly known as:  KLONOPIN Take 1 mg by mouth 2 (two) times daily.   docusate sodium 100 MG capsule Commonly known as:  COLACE Take 100 mg by mouth 2 (two) times daily.   FLUoxetine 40 MG capsule Commonly known as:  PROZAC Take 40 mg by mouth daily.   hydrALAZINE 25 MG tablet Commonly known as:  APRESOLINE Take 25 mg by mouth 3 (three) times daily.   HYDROcodone-acetaminophen 5-325 MG tablet Commonly known as:  NORCO/VICODIN Take 1 tablet by mouth every 6 (six) hours as needed for severe pain.   levothyroxine 25 MCG tablet Commonly known as:  SYNTHROID, LEVOTHROID Take 1 tablet (25 mcg total) by mouth daily before breakfast.   losartan 50 MG tablet Commonly known as:  COZAAR Take 1 tablet (50 mg total) by mouth daily.   pantoprazole 40 MG tablet Commonly known as:  PROTONIX Take 1 tablet (40 mg total) by mouth daily as needed (for acid reflux).   polyethylene glycol powder powder Commonly known as:  GLYCOLAX/MIRALAX Take 17 g by mouth daily as needed for mild constipation.        Signed: Lori Liew 09/10/2017, 10:46 AM

## 2017-09-23 NOTE — Progress Notes (Signed)
Pt declining off BIPAP. Placing on morphine gtt.  Family at bedside.  I assured we would keep her comfortable.  Plan Continue comfort based care.   Erick Colace ACNP-BC [place] Pager # [phone] OR # [phone] if no answer

## 2017-09-23 NOTE — Progress Notes (Signed)
Patient taken off of bipap and placed on 2L nasal cannula per MD.  Patient is DNR and comfort care.  Tolerating well.

## 2017-09-23 DEATH — deceased
